# Patient Record
Sex: Female | Born: 1950 | Race: White | Hispanic: No | Marital: Married | Smoking: Former smoker
Health system: Southern US, Community
[De-identification: ages and names within clinical notes are randomized; demographics above are authoritative.]

## PROBLEM LIST (undated history)

## (undated) DIAGNOSIS — M1990 Unspecified osteoarthritis, unspecified site: Secondary | ICD-10-CM

## (undated) DIAGNOSIS — N183 Chronic kidney disease, stage 3 unspecified: Secondary | ICD-10-CM

## (undated) DIAGNOSIS — N952 Postmenopausal atrophic vaginitis: Secondary | ICD-10-CM

## (undated) DIAGNOSIS — I10 Essential (primary) hypertension: Secondary | ICD-10-CM

## (undated) DIAGNOSIS — E785 Hyperlipidemia, unspecified: Secondary | ICD-10-CM

## (undated) HISTORY — DX: Hyperlipidemia, unspecified: E78.5

## (undated) HISTORY — PX: HERNIA REPAIR: SHX51

## (undated) HISTORY — DX: Unspecified osteoarthritis, unspecified site: M19.90

## (undated) HISTORY — PX: HEMORROIDECTOMY: SUR656

## (undated) HISTORY — DX: Postmenopausal atrophic vaginitis: N95.2

## (undated) HISTORY — DX: Chronic kidney disease, stage 3 unspecified: N18.30

## (undated) HISTORY — DX: Chronic kidney disease, stage 3 (moderate): N18.3

## (undated) HISTORY — DX: Essential (primary) hypertension: I10

---

## 2012-10-24 ENCOUNTER — Ambulatory Visit: Payer: Self-pay | Admitting: Family Medicine

## 2012-10-24 LAB — HM MAMMOGRAPHY

## 2012-10-30 LAB — HM PAP SMEAR

## 2013-11-25 ENCOUNTER — Other Ambulatory Visit: Payer: Self-pay | Admitting: Family Medicine

## 2013-11-25 DIAGNOSIS — N649 Disorder of breast, unspecified: Secondary | ICD-10-CM

## 2013-12-04 ENCOUNTER — Ambulatory Visit
Admission: RE | Admit: 2013-12-04 | Discharge: 2013-12-04 | Disposition: A | Discharge status: Home / Self Care | Payer: BC Managed Care – PPO | Source: Ambulatory Visit | Attending: Family Medicine | Admitting: Family Medicine

## 2013-12-04 DIAGNOSIS — N649 Disorder of breast, unspecified: Secondary | ICD-10-CM

## 2013-12-05 ENCOUNTER — Ambulatory Visit: Payer: Self-pay | Admitting: Neurology

## 2015-01-08 ENCOUNTER — Other Ambulatory Visit: Payer: Self-pay

## 2015-01-08 DIAGNOSIS — Z1231 Encounter for screening mammogram for malignant neoplasm of breast: Secondary | ICD-10-CM

## 2015-01-21 ENCOUNTER — Ambulatory Visit
Admission: RE | Admit: 2015-01-21 | Discharge: 2015-01-21 | Disposition: A | Discharge status: Home / Self Care | Payer: BLUE CROSS/BLUE SHIELD | Source: Ambulatory Visit

## 2015-01-21 DIAGNOSIS — Z1231 Encounter for screening mammogram for malignant neoplasm of breast: Secondary | ICD-10-CM

## 2015-02-25 DIAGNOSIS — N183 Chronic kidney disease, stage 3 unspecified: Secondary | ICD-10-CM | POA: Insufficient documentation

## 2015-02-25 DIAGNOSIS — N952 Postmenopausal atrophic vaginitis: Secondary | ICD-10-CM | POA: Insufficient documentation

## 2015-02-25 DIAGNOSIS — I129 Hypertensive chronic kidney disease with stage 1 through stage 4 chronic kidney disease, or unspecified chronic kidney disease: Secondary | ICD-10-CM | POA: Insufficient documentation

## 2015-02-25 DIAGNOSIS — E785 Hyperlipidemia, unspecified: Secondary | ICD-10-CM | POA: Insufficient documentation

## 2015-03-02 ENCOUNTER — Encounter: Payer: Self-pay | Admitting: Family Medicine

## 2015-03-02 ENCOUNTER — Ambulatory Visit (INDEPENDENT_AMBULATORY_CARE_PROVIDER_SITE_OTHER): Payer: BLUE CROSS/BLUE SHIELD | Admitting: Family Medicine

## 2015-03-02 VITALS — BP 112/73 | HR 61 | Temp 97.0°F | Ht 61.5 in | Wt 143.0 lb

## 2015-03-02 DIAGNOSIS — Z720 Tobacco use: Secondary | ICD-10-CM

## 2015-03-02 DIAGNOSIS — Z0000 Encounter for general adult medical examination without abnormal findings: Secondary | ICD-10-CM | POA: Diagnosis not present

## 2015-03-02 DIAGNOSIS — Z1211 Encounter for screening for malignant neoplasm of colon: Secondary | ICD-10-CM | POA: Diagnosis not present

## 2015-03-02 DIAGNOSIS — I10 Essential (primary) hypertension: Secondary | ICD-10-CM

## 2015-03-02 DIAGNOSIS — R252 Cramp and spasm: Secondary | ICD-10-CM

## 2015-03-02 DIAGNOSIS — Z87891 Personal history of nicotine dependence: Secondary | ICD-10-CM | POA: Insufficient documentation

## 2015-03-02 DIAGNOSIS — E785 Hyperlipidemia, unspecified: Secondary | ICD-10-CM | POA: Diagnosis not present

## 2015-03-02 MED ORDER — ATORVASTATIN CALCIUM 20 MG PO TABS: 20.0000 mg | ORAL_TABLET | Freq: Every day | ORAL | Status: DC

## 2015-03-02 MED ORDER — LISINOPRIL 10 MG PO TABS: 10.0000 mg | ORAL_TABLET | Freq: Every day | ORAL | Status: DC

## 2015-03-02 NOTE — Patient Instructions (Addendum)
Three servings of calcium a day; fall precautions We'll order the low dose chest CT We'll contact you about the lab results Return in one year for next physical   Health Maintenance Adopting a healthy lifestyle and getting preventive care can go a long way to promote health and wellness. Talk with your health care provider about what schedule of regular examinations is right for you. This is a good chance for you to check in with your provider about disease prevention and staying healthy. In between checkups, there are plenty of things you can do on your own. Experts have done a lot of research about which lifestyle changes and preventive measures are most likely to keep you healthy. Ask your health care provider for more information. WEIGHT AND DIET  Eat a healthy diet  Be sure to include plenty of vegetables, fruits, low-fat dairy products, and lean protein.  Do not eat a lot of foods high in solid fats, added sugars, or salt.  Get regular exercise. This is one of the most important things you can do for your health.  Most adults should exercise for at least 150 minutes each week. The exercise should increase your heart rate and make you sweat (moderate-intensity exercise).  Most adults should also do strengthening exercises at least twice a week. This is in addition to the moderate-intensity exercise.  Maintain a healthy weight  Body mass index (BMI) is a measurement that can be used to identify possible weight problems. It estimates body fat based on height and weight. Your health care provider can help determine your BMI and help you achieve or maintain a healthy weight.  For females 54 years of age and older:   A BMI below 18.5 is considered underweight.  A BMI of 18.5 to 24.9 is normal.  A BMI of 25 to 29.9 is considered overweight.  A BMI of 30 and above is considered obese.  Watch levels of cholesterol and blood lipids  You should start having your blood tested for  lipids and cholesterol at 63 years of age, then have this test every 5 years.  You may need to have your cholesterol levels checked more often if:  Your lipid or cholesterol levels are high.  You are older than 63 years of age.  You are at high risk for heart disease.  CANCER SCREENING   Lung Cancer  Lung cancer screening is recommended for adults 68-65 years old who are at high risk for lung cancer because of a history of smoking.  A yearly low-dose CT scan of the lungs is recommended for people who:  Currently smoke.  Have quit within the past 15 years.  Have at least a 30-pack-year history of smoking. A pack year is smoking an average of one pack of cigarettes a day for 1 year.  Yearly screening should continue until it has been 15 years since you quit.  Yearly screening should stop if you develop a health problem that would prevent you from having lung cancer treatment.  Breast Cancer  Practice breast self-awareness. This means understanding how your breasts normally appear and feel.  It also means doing regular breast self-exams. Let your health care provider know about any changes, no matter how small.  If you are in your 20s or 30s, you should have a clinical breast exam (CBE) by a health care provider every 1-3 years as part of a regular health exam.  If you are 69 or older, have a CBE every year. Also  consider having a breast X-ray (mammogram) every year.  If you have a family history of breast cancer, talk to your health care provider about genetic screening.  If you are at high risk for breast cancer, talk to your health care provider about having an MRI and a mammogram every year.  Breast cancer gene (BRCA) assessment is recommended for women who have family members with BRCA-related cancers. BRCA-related cancers include:  Breast.  Ovarian.  Tubal.  Peritoneal cancers.  Results of the assessment will determine the need for genetic counseling and BRCA1  and BRCA2 testing. Cervical Cancer Routine pelvic examinations to screen for cervical cancer are no longer recommended for nonpregnant women who are considered low risk for cancer of the pelvic organs (ovaries, uterus, and vagina) and who do not have symptoms. A pelvic examination may be necessary if you have symptoms including those associated with pelvic infections. Ask your health care provider if a screening pelvic exam is right for you.   The Pap test is the screening test for cervical cancer for women who are considered at risk.  If you had a hysterectomy for a problem that was not cancer or a condition that could lead to cancer, then you no longer need Pap tests.  If you are older than 65 years, and you have had normal Pap tests for the past 10 years, you no longer need to have Pap tests.  If you have had past treatment for cervical cancer or a condition that could lead to cancer, you need Pap tests and screening for cancer for at least 20 years after your treatment.  If you no longer get a Pap test, assess your risk factors if they change (such as having a new sexual partner). This can affect whether you should start being screened again.  Some women have medical problems that increase their chance of getting cervical cancer. If this is the case for you, your health care provider may recommend more frequent screening and Pap tests.  The human papillomavirus (HPV) test is another test that may be used for cervical cancer screening. The HPV test looks for the virus that can cause cell changes in the cervix. The cells collected during the Pap test can be tested for HPV.  The HPV test can be used to screen women 65 years of age and older. Getting tested for HPV can extend the interval between normal Pap tests from three to five years.  An HPV test also should be used to screen women of any age who have unclear Pap test results.  After 63 years of age, women should have HPV testing as often  as Pap tests.  Colorectal Cancer  This type of cancer can be detected and often prevented.  Routine colorectal cancer screening usually begins at 63 years of age and continues through 63 years of age.  Your health care provider may recommend screening at an earlier age if you have risk factors for colon cancer.  Your health care provider may also recommend using home test kits to check for hidden blood in the stool.  A small camera at the end of a tube can be used to examine your colon directly (sigmoidoscopy or colonoscopy). This is done to check for the earliest forms of colorectal cancer.  Routine screening usually begins at age 4.  Direct examination of the colon should be repeated every 5-10 years through 63 years of age. However, you may need to be screened more often if early forms of  precancerous polyps or small growths are found. Skin Cancer  Check your skin from head to toe regularly.  Tell your health care provider about any new moles or changes in moles, especially if there is a change in a mole's shape or color.  Also tell your health care provider if you have a mole that is larger than the size of a pencil eraser.  Always use sunscreen. Apply sunscreen liberally and repeatedly throughout the day.  Protect yourself by wearing long sleeves, pants, a wide-brimmed hat, and sunglasses whenever you are outside. HEART DISEASE, DIABETES, AND HIGH BLOOD PRESSURE   Have your blood pressure checked at least every 1-2 years. High blood pressure causes heart disease and increases the risk of stroke.  If you are between 55 years and 79 years old, ask your health care provider if you should take aspirin to prevent strokes.  Have regular diabetes screenings. This involves taking a blood sample to check your fasting blood sugar level.  If you are at a normal weight and have a low risk for diabetes, have this test once every three years after 63 years of age.  If you are overweight  and have a high risk for diabetes, consider being tested at a younger age or more often. PREVENTING INFECTION  Hepatitis B  If you have a higher risk for hepatitis B, you should be screened for this virus. You are considered at high risk for hepatitis B if:  You were born in a country where hepatitis B is common. Ask your health care provider which countries are considered high risk.  Your parents were born in a high-risk country, and you have not been immunized against hepatitis B (hepatitis B vaccine).  You have HIV or AIDS.  You use needles to inject street drugs.  You live with someone who has hepatitis B.  You have had sex with someone who has hepatitis B.  You get hemodialysis treatment.  You take certain medicines for conditions, including cancer, organ transplantation, and autoimmune conditions. Hepatitis C  Blood testing is recommended for:  Everyone born from 1945 through 1965.  Anyone with known risk factors for hepatitis C. Sexually transmitted infections (STIs)  You should be screened for sexually transmitted infections (STIs) including gonorrhea and chlamydia if:  You are sexually active and are younger than 63 years of age.  You are older than 63 years of age and your health care provider tells you that you are at risk for this type of infection.  Your sexual activity has changed since you were last screened and you are at an increased risk for chlamydia or gonorrhea. Ask your health care provider if you are at risk.  If you do not have HIV, but are at risk, it may be recommended that you take a prescription medicine daily to prevent HIV infection. This is called pre-exposure prophylaxis (PrEP). You are considered at risk if:  You are sexually active and do not regularly use condoms or know the HIV status of your partner(s).  You take drugs by injection.  You are sexually active with a partner who has HIV. Talk with your health care provider about whether  you are at high risk of being infected with HIV. If you choose to begin PrEP, you should first be tested for HIV. You should then be tested every 3 months for as long as you are taking PrEP.  PREGNANCY   If you are premenopausal and you may become pregnant, ask your health care   provider about preconception counseling.  If you may become pregnant, take 400 to 800 micrograms (mcg) of folic acid every day.  If you want to prevent pregnancy, talk to your health care provider about birth control (contraception). OSTEOPOROSIS AND MENOPAUSE   Osteoporosis is a disease in which the bones lose minerals and strength with aging. This can result in serious bone fractures. Your risk for osteoporosis can be identified using a bone density scan.  If you are 65 years of age or older, or if you are at risk for osteoporosis and fractures, ask your health care provider if you should be screened.  Ask your health care provider whether you should take a calcium or vitamin D supplement to lower your risk for osteoporosis.  Menopause may have certain physical symptoms and risks.  Hormone replacement therapy may reduce some of these symptoms and risks. Talk to your health care provider about whether hormone replacement therapy is right for you.  HOME CARE INSTRUCTIONS   Schedule regular health, dental, and eye exams.  Stay current with your immunizations.   Do not use any tobacco products including cigarettes, chewing tobacco, or electronic cigarettes.  If you are pregnant, do not drink alcohol.  If you are breastfeeding, limit how much and how often you drink alcohol.  Limit alcohol intake to no more than 1 drink per day for nonpregnant women. One drink equals 12 ounces of beer, 5 ounces of wine, or 1 ounces of hard liquor.  Do not use street drugs.  Do not share needles.  Ask your health care provider for help if you need support or information about quitting drugs.  Tell your health care  provider if you often feel depressed.  Tell your health care provider if you have ever been abused or do not feel safe at home. Document Released: 12/06/2010 Document Revised: 10/07/2013 Document Reviewed: 04/24/2013 Exit[employer] Patient Information 2015 [employer]. This information is not intended to replace advice given to you by your health care provider. Make sure you discuss any questions you have with your health care provider.  

## 2015-03-02 NOTE — Progress Notes (Signed)
Patient ID: Jasmine Church, female   DOB: Dec 17, 1950, 64 y.o.   MRN: [id number]   Subjective:   Jasmine Church is a 64 y.o. female here for a complete physical exam  Interim issues since last visit: None  USPSTF grade A and B recommendations reviewed Alcohol; none Tobacco: none, quit 2006; smoked from [place] to 2006; smoked 1 ppd Depression screen [facility] 2/9 03/02/2015  Decreased Interest 0  Down, Depressed, Hopeless 0  PHQ - 2 Score 0   HIV and hepatitis C, hepatitis B: politely declined No abuse Pap smear: normal May 2014; due in 2017; no hx of abnormals; saw Dr. Marcelline Mates in Dec 2015 Chol and glucose: draw today Colorectal: she does not want colonoscpies, willing to do stool cards Breast cancer: gets mammograms yearly Osteoporosis: not yet 66; no early menopause; no steroid use; hx of smoking; sister borderline puny bones; not good calcium; start at age 4 per her requests AAA: no fam hx Aspirin: taking daily 81 mg Diet: coffee for breakfast; big meal at supper; salad, deer burgers, chicken; not much red meat; likes cheese, not many eggs Exercise: moves around a lot, more active than most people; no fit bit or measuring device Skin cancer: avoid tanning beds; not using sunscreen  Past Medical History  Diagnosis Date  . Arthritis   . Vaginal atrophy   . Hypertension   . Hyperlipidemia   . CKD (chronic kidney disease) stage 3, GFR 30-59 ml/min    Past Surgical History  Procedure Laterality Date  . Hemorroidectomy    . Hernia repair     Family History  Problem Relation Age of Onset  . Arthritis Mother   . Hyperlipidemia Mother   . Hypertension Mother   . Heart disease Father   . Hypertension Father   . Heart attack Father   . Cancer Sister     uterus  . Hypertension Sister   . Heart disease Brother   . Heart attack Brother   . Hypertension Brother   . Stroke Maternal Grandmother   . Diabetes Neg Hx   . Heart attack Brother   . Hypertension Brother    Social  History  Substance Use Topics  . Smoking status: Former Smoker -- 1.00 packs/day for 35 years    Types: Cigarettes    Quit date: 06/06/2004  . Smokeless tobacco: Never Used  . Alcohol Use: No   Review of Systems  Constitutional: Negative for unexpected weight change.  HENT: Negative for nosebleeds and sore throat.   Eyes: Negative for visual disturbance.  Respiratory: Negative for shortness of breath and wheezing.   Cardiovascular: Negative for chest pain.  Gastrointestinal: Negative for blood in stool.  Endocrine: Negative for cold intolerance, heat intolerance, polydipsia and polyuria.  Genitourinary: Negative for hematuria.  Musculoskeletal: Positive for myalgias (cramps in legs and feet) and arthralgias (2nd MCP right hand).  Skin:       No worrisome moles  Allergic/Immunologic: Negative for food allergies.  Neurological: Negative for tremors and headaches.  Hematological: Bruises/bleeds easily (on the right breast from broom; taking aspirin).  Psychiatric/Behavioral: Negative for confusion, dysphoric mood and decreased concentration.   Objective:   Filed Vitals:   03/02/15 1354  BP: 112/73  Pulse: 61  Temp: 97 F (36.1 C)  Height: 5' 1.5" (1.562 m)  Weight: 143 lb (64.864 kg)  SpO2: 99%   Body mass index is 26.59 kg/(m^2). Wt Readings from Last 3 Encounters:  03/02/15 143 lb (64.864 kg)  11/12/13 145  lb (65.772 kg)   Physical Exam  Constitutional: She appears well-developed and well-nourished.  HENT:  Head: Normocephalic and atraumatic.  Right Ear: Hearing, tympanic membrane, external ear and ear canal normal.  Left Ear: Hearing, tympanic membrane, external ear and ear canal normal.  Eyes: Conjunctivae and EOM are normal. Right eye exhibits no hordeolum. Left eye exhibits no hordeolum. No scleral icterus.  Neck: Carotid bruit is not present. No thyromegaly present.  Cardiovascular: Normal rate, regular rhythm, S1 normal, S2 normal and normal heart sounds.   No  extrasystoles are present.  Pulmonary/Chest: Effort normal and breath sounds normal. No respiratory distress. Right breast exhibits no inverted nipple, no mass, no nipple discharge, no skin change and no tenderness. Left breast exhibits no inverted nipple, no mass, no nipple discharge, no skin change and no tenderness. Breasts are symmetrical.  Abdominal: Soft. Normal appearance and bowel sounds are normal. She exhibits no distension, no abdominal bruit, no pulsatile midline mass and no mass. There is no hepatosplenomegaly. There is no tenderness. No hernia.  Musculoskeletal: Normal range of motion. She exhibits no edema.  Lymphadenopathy:       Head (right side): No submandibular adenopathy present.       Head (left side): No submandibular adenopathy present.    She has no cervical adenopathy.    She has no axillary adenopathy.  Neurological: She is alert. She displays no tremor. No cranial nerve deficit. She exhibits normal muscle tone. Gait normal.  Reflex Scores:      Patellar reflexes are 2+ on the right side and 2+ on the left side. Skin: Skin is warm and dry. No bruising and no ecchymosis noted. No cyanosis. No pallor.  Psychiatric: Her speech is normal and behavior is normal. Thought content normal. Her mood appears not anxious. She does not exhibit a depressed mood.    Assessment/Plan:   Problem List Items Addressed This Visit      Cardiovascular and Mediastinum   Hypertension    Well-controlled on current medicine; check K+ and creatinine; refills of med given      Relevant Medications   aspirin EC 81 MG tablet   atorvastatin (LIPITOR) 20 MG tablet   lisinopril (PRINIVIL,ZESTRIL) 10 MG tablet     Other   Hyperlipidemia    Check lipids today; monitor sgpt on statin; refills of medicine given today      Relevant Medications   aspirin EC 81 MG tablet   atorvastatin (LIPITOR) 20 MG tablet   lisinopril (PRINIVIL,ZESTRIL) 10 MG tablet   Hx of smoking    Recommended yearly  low-dose chest CT to screen for lung cancer until she is 15 years out from smoking cessation      Relevant Orders   CT CHEST LUNG CA SCREEN LOW DOSE W/O CM   Preventative health care - Primary    USPSTF grade A and B recommendations reviewed; healthy living encouraged; mammogram every 1-2 years      Relevant Orders   Lipid Panel w/o Chol/HDL Ratio (Completed)   Comprehensive metabolic panel (Completed)   Leg cramps    Check Mg2+ today      Relevant Orders   Magnesium (Completed)   Colon cancer screening    Patient refuses colonoscopy; willing to do stool cards         Meds ordered this encounter  Medications  . aspirin EC 81 MG tablet    Sig: Take 81 mg by mouth daily.  Marland Kitchen atorvastatin (LIPITOR) 20 MG tablet  Sig: Take 1 tablet (20 mg total) by mouth at bedtime.    Dispense:  90 tablet    Refill:  0  . lisinopril (PRINIVIL,ZESTRIL) 10 MG tablet    Sig: Take 1 tablet (10 mg total) by mouth daily.    Dispense:  90 tablet    Refill:  0   Orders Placed This Encounter  Procedures  . CT CHEST LUNG CA SCREEN LOW DOSE W/O CM    Standing Status: Future     Number of Occurrences:      Standing Expiration Date: 05/01/2016    Order Specific Question:  Reason for Exam (SYMPTOM  OR DIAGNOSIS REQUIRED)    Answer:  hx of smoking, quit in 2006    Order Specific Question:  Preferred Imaging Location?    Answer:  [facility]  . Magnesium  . Lipid Panel w/o Chol/HDL Ratio    Order Specific Question:  Has the patient fasted?    Answer:  Yes  . Comprehensive metabolic panel    Order Specific Question:  Has the patient fasted?    Answer:  Yes    Follow up plan: Return in about 1 year (around 03/01/2016) for next physical. An after-visit summary was printed and given to the patient at [place].  Please see the patient instructions which may contain other information and recommendations beyond what is mentioned above in the assessment and plan.  Meds ordered  this encounter  Medications  . aspirin EC 81 MG tablet    Sig: Take 81 mg by mouth daily.  Marland Kitchen atorvastatin (LIPITOR) 20 MG tablet    Sig: Take 1 tablet (20 mg total) by mouth at bedtime.    Dispense:  90 tablet    Refill:  0  . lisinopril (PRINIVIL,ZESTRIL) 10 MG tablet    Sig: Take 1 tablet (10 mg total) by mouth daily.    Dispense:  90 tablet    Refill:  0   Orders Placed This Encounter  Procedures  . CT CHEST LUNG CA SCREEN LOW DOSE W/O CM  . Magnesium  . Lipid Panel w/o Chol/HDL Ratio  . Comprehensive metabolic panel

## 2015-03-03 ENCOUNTER — Encounter: Payer: Self-pay | Admitting: Family Medicine

## 2015-03-03 DIAGNOSIS — Z1211 Encounter for screening for malignant neoplasm of colon: Secondary | ICD-10-CM | POA: Insufficient documentation

## 2015-03-03 LAB — COMPREHENSIVE METABOLIC PANEL
ALBUMIN: 4.2 g/dL (ref 3.6–4.8)
ALT: 23 IU/L (ref 0–32)
AST: 20 IU/L (ref 0–40)
Albumin/Globulin Ratio: 1.8 (ref 1.1–2.5)
Alkaline Phosphatase: 80 IU/L (ref 39–117)
BILIRUBIN TOTAL: 0.4 mg/dL (ref 0.0–1.2)
BUN / CREAT RATIO: 13 (ref 11–26)
BUN: 13 mg/dL (ref 8–27)
CO2: 24 mmol/L (ref 18–29)
CREATININE: 1 mg/dL (ref 0.57–1.00)
Calcium: 9.5 mg/dL (ref 8.7–10.3)
Chloride: 102 mmol/L (ref 97–108)
GFR calc non Af Amer: 61 mL/min/{1.73_m2} (ref >59)
GFR, EST AFRICAN AMERICAN: 70 mL/min/{1.73_m2} (ref >59)
GLUCOSE: 84 mg/dL (ref 65–99)
Globulin, Total: 2.4 g/dL (ref 1.5–4.5)
Potassium: 4.7 mmol/L (ref 3.5–5.2)
Sodium: 141 mmol/L (ref 134–144)
TOTAL PROTEIN: 6.6 g/dL (ref 6.0–8.5)

## 2015-03-03 LAB — LIPID PANEL W/O CHOL/HDL RATIO
Cholesterol, Total: 150 mg/dL (ref 100–199)
HDL: 60 mg/dL (ref >39)
LDL Calculated: 75 mg/dL (ref 0–99)
TRIGLYCERIDES: 73 mg/dL (ref 0–149)
VLDL Cholesterol Cal: 15 mg/dL (ref 5–40)

## 2015-03-03 LAB — MAGNESIUM: MAGNESIUM: 2 mg/dL (ref 1.6–2.3)

## 2015-03-03 NOTE — Assessment & Plan Note (Signed)
Recommended yearly low-dose chest CT to screen for lung cancer until she is 15 years out from smoking cessation

## 2015-03-03 NOTE — Assessment & Plan Note (Signed)
USPSTF grade A and B recommendations reviewed; healthy living encouraged; mammogram every 1-2 years

## 2015-03-03 NOTE — Assessment & Plan Note (Signed)
Patient refuses colonoscopy; willing to do stool cards

## 2015-03-03 NOTE — Assessment & Plan Note (Signed)
Check Mg2+ today

## 2015-03-03 NOTE — Assessment & Plan Note (Signed)
Check lipids today; monitor sgpt on statin; refills of medicine given today

## 2015-03-03 NOTE — Assessment & Plan Note (Signed)
Well-controlled on current medicine; check K+ and creatinine; refills of med given

## 2015-03-10 ENCOUNTER — Other Ambulatory Visit: Payer: Self-pay

## 2015-03-10 DIAGNOSIS — Z1211 Encounter for screening for malignant neoplasm of colon: Secondary | ICD-10-CM

## 2015-03-10 LAB — FECAL OCCULT BLOOD, GUAIAC
SPECIMEN 1: NEGATIVE
SPECIMEN 3: NEGATIVE
Specimen 2: NEGATIVE

## 2015-03-18 ENCOUNTER — Telehealth: Payer: Self-pay

## 2015-03-18 ENCOUNTER — Encounter: Payer: Self-pay | Admitting: Family Medicine

## 2015-03-18 NOTE — Telephone Encounter (Signed)
She is attempting to get life insurance. She had a referral to do a low dose chest CT but she won't be doing it because her insurance will not pay for it. She needs a letter to get approved for life insurance stating that the low dose Chest CT is not medically necessary and that it was just a screening test. Letter needs to be faxed to [phone]

## 2015-03-18 NOTE — Telephone Encounter (Signed)
I spoke with Jasmine Church about the test. She said her insurance does cover the test but that her copay for it would be $100 and she can't afford to pay that. I advised her if she changes her mind, to call us back and we will get it scheduled.

## 2015-03-18 NOTE — Telephone Encounter (Signed)
I typed up a letter However, she needs to clarify the difference between life insurance and health insurance Her health insurance absolutely should pay for this; if they don't, I want to know so I can report it I absolutely recommend that she have the chest CT done

## 2015-03-18 NOTE — Telephone Encounter (Signed)
Letter faxed to Chase Picket at [employer]. OK per patient.

## 2015-05-25 ENCOUNTER — Other Ambulatory Visit: Payer: Self-pay | Admitting: Family Medicine

## 2015-05-25 NOTE — Telephone Encounter (Signed)
Sept 2016 labs reviewed; Rx approved I do not see that her chest CT for lung cancer screening has been scheduled, so I sent a message to Burgess Estelle to please schedule that

## 2015-05-25 NOTE — Telephone Encounter (Signed)
Routing to provider  

## 2015-05-28 ENCOUNTER — Telehealth: Payer: Self-pay | Admitting: *Deleted

## 2015-05-28 NOTE — Telephone Encounter (Signed)
-----   Message from Lieutenant Diego, RN sent at 05/28/2015 11:47 AM EST ----- Regarding: RE: Low dose chest CT please Dr. Sanda Klein, in the past Ms. Manzer told me that she did not want a screening scan. I will be happy to contact her again. Thanks, Shawn ----- Message -----    From: Arnetha Courser, MD    Sent: 05/25/2015   8:55 PM      To: Lieutenant Diego, RN Subject: Low dose chest CT please                       Jasmine Church Female, 64 y.o., Apr 06, 1951 MRN:  [id number] Low dose CT scan of chest for lung cancer screening please

## 2015-05-28 NOTE — Telephone Encounter (Signed)
Spoke with patient at time of referral for lung cancer screening CT scan (03/03/15-03/17/15). After lengthy conversations with the patient and insurance company, I was able to confirm that screening scan is covered by insurance with a copay that will be due. I informed the patient that we have funds available to offer the scan regardless of ability to pay the copay amount. The patient adamantly did not want the scan done, even if she would not be responsible for the copay amount.

## 2015-07-26 ENCOUNTER — Other Ambulatory Visit: Payer: Self-pay | Admitting: Family Medicine

## 2015-07-26 NOTE — Telephone Encounter (Signed)
Last BP and CMP from Sept 2016 reviewed; Rx approved

## 2015-07-28 ENCOUNTER — Other Ambulatory Visit: Payer: Self-pay | Admitting: Family Medicine

## 2015-07-28 NOTE — Telephone Encounter (Signed)
Last cmp reviewed Sept 2016; rx approved

## 2015-08-27 ENCOUNTER — Other Ambulatory Visit: Payer: Self-pay | Admitting: Family Medicine

## 2015-08-27 NOTE — Telephone Encounter (Signed)
Labs from sept 2016 reviewed; Rx approved

## 2016-01-08 ENCOUNTER — Other Ambulatory Visit: Payer: Self-pay | Admitting: Family Medicine

## 2016-01-08 DIAGNOSIS — Z1231 Encounter for screening mammogram for malignant neoplasm of breast: Secondary | ICD-10-CM

## 2016-01-26 ENCOUNTER — Ambulatory Visit
Admission: RE | Admit: 2016-01-26 | Discharge: 2016-01-26 | Disposition: A | Discharge status: Home / Self Care | Payer: BLUE CROSS/BLUE SHIELD | Source: Ambulatory Visit | Attending: Family Medicine | Admitting: Family Medicine

## 2016-01-26 DIAGNOSIS — Z1231 Encounter for screening mammogram for malignant neoplasm of breast: Secondary | ICD-10-CM

## 2016-01-27 ENCOUNTER — Encounter (INDEPENDENT_AMBULATORY_CARE_PROVIDER_SITE_OTHER): Payer: Self-pay

## 2016-01-27 ENCOUNTER — Other Ambulatory Visit: Payer: Self-pay | Admitting: Family Medicine

## 2016-01-27 DIAGNOSIS — R928 Other abnormal and inconclusive findings on diagnostic imaging of breast: Secondary | ICD-10-CM

## 2016-02-02 ENCOUNTER — Ambulatory Visit
Admission: RE | Admit: 2016-02-02 | Discharge: 2016-02-02 | Disposition: A | Discharge status: Home / Self Care | Payer: BLUE CROSS/BLUE SHIELD | Source: Ambulatory Visit | Attending: Family Medicine | Admitting: Family Medicine

## 2016-02-02 ENCOUNTER — Encounter: Payer: Self-pay | Admitting: Family Medicine

## 2016-02-02 DIAGNOSIS — R928 Other abnormal and inconclusive findings on diagnostic imaging of breast: Secondary | ICD-10-CM

## 2016-02-25 ENCOUNTER — Telehealth: Payer: Self-pay

## 2016-02-25 MED ORDER — ATORVASTATIN CALCIUM 20 MG PO TABS: ORAL_TABLET | ORAL | 1 refills | Status: DC

## 2016-02-25 NOTE — Telephone Encounter (Signed)
Atorvastatin 20mg  Patient scheduled for 03/02/16

## 2016-03-02 ENCOUNTER — Encounter: Payer: Self-pay | Admitting: Family Medicine

## 2016-03-02 ENCOUNTER — Ambulatory Visit (INDEPENDENT_AMBULATORY_CARE_PROVIDER_SITE_OTHER): Payer: BLUE CROSS/BLUE SHIELD | Admitting: Family Medicine

## 2016-03-02 ENCOUNTER — Encounter: Payer: BLUE CROSS/BLUE SHIELD | Admitting: Family Medicine

## 2016-03-02 VITALS — BP 131/82 | HR 65 | Temp 97.9°F | Ht 61.7 in | Wt 143.0 lb

## 2016-03-02 DIAGNOSIS — N952 Postmenopausal atrophic vaginitis: Secondary | ICD-10-CM | POA: Diagnosis not present

## 2016-03-02 DIAGNOSIS — I10 Essential (primary) hypertension: Secondary | ICD-10-CM

## 2016-03-02 DIAGNOSIS — Z1211 Encounter for screening for malignant neoplasm of colon: Secondary | ICD-10-CM

## 2016-03-02 DIAGNOSIS — N183 Chronic kidney disease, stage 3 unspecified: Secondary | ICD-10-CM

## 2016-03-02 DIAGNOSIS — Z0000 Encounter for general adult medical examination without abnormal findings: Secondary | ICD-10-CM | POA: Diagnosis not present

## 2016-03-02 DIAGNOSIS — Z23 Encounter for immunization: Secondary | ICD-10-CM

## 2016-03-02 DIAGNOSIS — E785 Hyperlipidemia, unspecified: Secondary | ICD-10-CM | POA: Diagnosis not present

## 2016-03-02 DIAGNOSIS — Z124 Encounter for screening for malignant neoplasm of cervix: Secondary | ICD-10-CM | POA: Diagnosis not present

## 2016-03-02 LAB — MICROALBUMIN, URINE WAIVED
Creatinine, Urine Waived: 200 mg/dL (ref 10–300)
MICROALB, UR WAIVED: 10 mg/L (ref 0–19)
Microalb/Creat Ratio: <30 mg/g (ref <30)

## 2016-03-02 LAB — UA/M W/RFLX CULTURE, ROUTINE
BILIRUBIN UA: NEGATIVE
Glucose, UA: NEGATIVE
KETONES UA: NEGATIVE
LEUKOCYTES UA: NEGATIVE
NITRITE UA: NEGATIVE
PH UA: 5 (ref 5.0–7.5)
Protein, UA: NEGATIVE
Specific Gravity, UA: 1.015 (ref 1.005–1.030)
UUROB: 0.2 mg/dL (ref 0.2–1.0)

## 2016-03-02 LAB — MICROSCOPIC EXAMINATION: WBC, UA: NONE SEEN /hpf (ref 0–?)

## 2016-03-02 NOTE — Progress Notes (Signed)
BP 131/82 (BP Location: Left Arm, Patient Position: Sitting, Cuff Size: Normal)   Pulse 65   Temp 97.9 F (36.6 C)   Ht 5' 1.7" (1.567 m)   Wt 143 lb (64.9 kg)   SpO2 100%   BMI 26.41 kg/m    Subjective:    Patient ID: Jasmine Church, female    DOB: Jan 01, 1951, 64 y.o.   MRN: [id number]  HPI: Jasmine Church is a 65 y.o. female presenting on 03/02/2016 for comprehensive medical examination. Current medical complaints include:  HYPERTENSION / HYPERLIPIDEMIA Satisfied with current treatment? yes Duration of hypertension: chronic BP monitoring frequency: not checking BP medication side effects: no Duration of hyperlipidemia: chronic Cholesterol medication side effects: no Cholesterol supplements: none Past cholesterol medications: atorvastain (lipitor) Medication compliance: excellent compliance Aspirin: yes Recent stressors: no Recurrent headaches: no Visual changes: no Palpitations: no Dyspnea: no Chest pain: no Lower extremity edema: no Dizzy/lightheaded: no  She currently lives with: husband Menopausal Symptoms: yes  Depression Screen done today and results listed below:  Depression screen [facility] 2/9 03/02/2016 03/02/2016 03/02/2015  Decreased Interest 0 0 0  Down, Depressed, Hopeless 0 0 0  PHQ - 2 Score 0 0 0    Past Medical History:  Past Medical History:  Diagnosis Date  . Arthritis   . CKD (chronic kidney disease) stage 3, GFR 30-59 ml/min   . Hyperlipidemia   . Hypertension   . Vaginal atrophy     Surgical History:  Past Surgical History:  Procedure Laterality Date  . HEMORROIDECTOMY    . HERNIA REPAIR      Medications:  Current Outpatient Prescriptions on File Prior to Visit  Medication Sig  . aspirin EC 81 MG tablet Take 81 mg by mouth daily.  Marland Kitchen atorvastatin (LIPITOR) 20 MG tablet TAKE 1 TABLET (20 MG TOTAL) BY MOUTH AT BEDTIME.  . Calcium Carbonate-Vitamin D (CALCIUM 500 + D) 500-125 MG-UNIT TABS Take by mouth daily. Patient takes it only 3  days a week.  Marland Kitchen lisinopril (PRINIVIL,ZESTRIL) 10 MG tablet TAKE 1 TABLET (10 MG TOTAL) BY MOUTH DAILY.   No current facility-administered medications on file prior to visit.     Allergies:  No Known Allergies  Social History:  Social History   Social History  . Marital status: Married    Spouse name: N/A  . Number of children: N/A  . Years of education: N/A   Occupational History  . Not on file.   Social History Main Topics  . Smoking status: Former Smoker    Packs/day: 1.00    Years: 35.00    Types: Cigarettes    Quit date: 06/06/2004  . Smokeless tobacco: Never Used  . Alcohol use No  . Drug use: No  . Sexual activity: No   Other Topics Concern  . Not on file   Social History Narrative  . No narrative on file   History  Smoking Status  . Former Smoker  . Packs/day: 1.00  . Years: 35.00  . Types: Cigarettes  . Quit date: 06/06/2004  Smokeless Tobacco  . Never Used   History  Alcohol Use No    Family History:  Family History  Problem Relation Age of Onset  . Arthritis Mother   . Hyperlipidemia Mother   . Hypertension Mother   . Heart disease Father   . Hypertension Father   . Heart attack Father   . Cancer Sister     uterus  . Hypertension Sister   .  Heart disease Brother   . Heart attack Brother   . Hypertension Brother   . Stroke Maternal Grandmother   . Heart attack Brother   . Hypertension Brother   . Diabetes Neg Hx     Past medical history, surgical history, medications, allergies, family history and social history reviewed with patient today and changes made to appropriate areas of the chart.   Review of Systems  Constitutional: Negative.   HENT: Negative.   Eyes: Negative.   Respiratory: Negative.   Cardiovascular: Negative.   Gastrointestinal: Positive for constipation and heartburn. Negative for abdominal pain, blood in stool, diarrhea, melena, nausea and vomiting.  Genitourinary: Negative.   Musculoskeletal: Negative.   Skin:  Negative.   Neurological: Positive for tingling (positional at night). Negative for dizziness, tremors, sensory change, speech change, focal weakness, seizures and loss of consciousness.  Endo/Heme/Allergies: Negative for environmental allergies and polydipsia. Bruises/bleeds easily.  Psychiatric/Behavioral: Negative.     All other ROS negative except what is listed above and in the HPI.      Objective:    BP 131/82 (BP Location: Left Arm, Patient Position: Sitting, Cuff Size: Normal)   Pulse 65   Temp 97.9 F (36.6 C)   Ht 5' 1.7" (1.567 m)   Wt 143 lb (64.9 kg)   SpO2 100%   BMI 26.41 kg/m   Wt Readings from Last 3 Encounters:  03/02/16 143 lb (64.9 kg)  03/02/15 143 lb (64.9 kg)  11/12/13 145 lb (65.8 kg)    Physical Exam  Constitutional: She is oriented to person, place, and time. She appears well-developed and well-nourished. No distress.  HENT:  Head: Normocephalic and atraumatic.  Right Ear: Hearing, tympanic membrane, external ear and ear canal normal.  Left Ear: Hearing, tympanic membrane, external ear and ear canal normal.  Nose: Nose normal.  Mouth/Throat: Uvula is midline, oropharynx is clear and moist and mucous membranes are normal. No oropharyngeal exudate.  Eyes: Conjunctivae, EOM and lids are normal. Pupils are equal, round, and reactive to light. Right eye exhibits no discharge. Left eye exhibits no discharge. No scleral icterus.  Neck: Normal range of motion. Neck supple. No JVD present. No tracheal deviation present. No thyromegaly present.  Cardiovascular: Normal rate, regular rhythm, normal heart sounds and intact distal pulses.  Exam reveals no gallop and no friction rub.   No murmur heard. Pulmonary/Chest: Effort normal. No stridor. No respiratory distress. She has no wheezes. She has no rales. She exhibits no tenderness. Right breast exhibits no inverted nipple, no mass, no nipple discharge, no skin change and no tenderness. Left breast exhibits no  inverted nipple, no mass, no nipple discharge, no skin change and no tenderness. Breasts are symmetrical.  Abdominal: Soft. Bowel sounds are normal. She exhibits no distension and no mass. There is no tenderness. There is no rebound and no guarding. Hernia confirmed negative in the right inguinal area and confirmed negative in the left inguinal area.  Genitourinary: Uterus normal. No breast swelling, tenderness, discharge or bleeding. No labial fusion. There is no rash, tenderness, lesion or injury on the right labia. There is no rash, tenderness, lesion or injury on the left labia. Uterus is not deviated, not enlarged, not fixed and not tender. Cervix exhibits no motion tenderness, no discharge and no friability. Right adnexum displays no mass, no tenderness and no fullness. Left adnexum displays no mass, no tenderness and no fullness. No erythema, tenderness or bleeding in the vagina. No foreign body in the vagina. No signs  of injury around the vagina. No vaginal discharge found.  Musculoskeletal: Normal range of motion.  Lymphadenopathy:    She has no cervical adenopathy.  Neurological: She is alert and oriented to person, place, and time.  Skin: Skin is intact. No rash noted. She is not diaphoretic.  Psychiatric: She has a normal mood and affect. Her speech is normal and behavior is normal. Judgment and thought content normal. Cognition and memory are normal.  Nursing note and vitals reviewed.   Results for orders placed or performed in visit on 03/10/15  [facility] Stool Card-TAKE HOME  Result Value Ref Range   Specimen 1 Neg    Specimen 2 Neg    Specimen 3 Neg       Assessment & Plan:   Problem List Items Addressed This Visit      Cardiovascular and Mediastinum   Hypertension    Under good control. Continue current regimen. Continue to monitor. Follow up 6 months.       Relevant Orders   Comprehensive metabolic panel   Microalbumin, Urine Waived   TSH   UA/M w/rflx Culture, Routine       Genitourinary   Vaginal atrophy    Improved with medication. Will call if starting to act up again and will do short course of estrogen.      CKD (chronic kidney disease) stage 3, GFR 30-59 ml/min    Continue to follow with nephrology. Call with any concerns.       Relevant Orders   CBC with Differential/Platelet   Comprehensive metabolic panel   UA/M w/rflx Culture, Routine     Other   Hyperlipidemia    Checking labs today. Adjust as needed. Refills given today. Call with any concerns.       Relevant Orders   Comprehensive metabolic panel   Lipid Panel w/o Chol/HDL Ratio    Other Visit Diagnoses    Routine general medical examination at a health care facility    -  Primary   Up to date on vaccines or declined. Screening labs checked today. Pap done today. Up to date on mammogram. Cologuard ordered today. Work on diet and exercise.    Relevant Orders   CBC with Differential/Platelet   Comprehensive metabolic panel   Lipid Panel w/o Chol/HDL Ratio   Microalbumin, Urine Waived   TSH   UA/M w/rflx Culture, Routine   Immunization due       Tdap given today.   Screening for colon cancer       Will do cologuard. Ordered today.   Relevant Orders   Cologuard   Encounter for screening for cervical cancer        Pap done today. Await results.   Relevant Orders   IGP, Aptima HPV, rfx 16/18,45       Follow up plan: Return in about 6 months (around 08/30/2016) for Follow up HTN and HLD.   LABORATORY TESTING:  - Pap smear: pap done  IMMUNIZATIONS:   - Tdap: Tetanus vaccination status reviewed: Tdap vaccination indicated and given today. - Influenza: Refused - Pneumovax: Declined  SCREENING: -Mammogram: Up to date  - Colonoscopy: Ordered today   PATIENT COUNSELING:   Advised to take 1 mg of folate supplement per day if capable of pregnancy.   Sexuality: Discussed sexually transmitted diseases, partner selection, use of condoms, avoidance of unintended pregnancy   and contraceptive alternatives.   Advised to avoid cigarette smoking.  I discussed with the patient that most people either abstain from  alcohol or drink within safe limits (<=14/week and <=4 drinks/occasion for males, <=7/weeks and <= 3 drinks/occasion for females) and that the risk for alcohol disorders and other health effects rises proportionally with the number of drinks per week and how often a drinker exceeds daily limits.  Discussed cessation/primary prevention of drug use and availability of treatment for abuse.   Diet: Encouraged to adjust caloric intake to maintain  or achieve ideal body weight, to reduce intake of dietary saturated fat and total fat, to limit sodium intake by avoiding high sodium foods and not adding table salt, and to maintain adequate dietary potassium and calcium preferably from fresh fruits, vegetables, and low-fat dairy products.    stressed the importance of regular exercise  Injury prevention: Discussed safety belts, safety helmets, smoke detector, smoking near bedding or upholstery.   Dental health: Discussed importance of regular tooth brushing, flossing, and dental visits.    NEXT PREVENTATIVE PHYSICAL DUE IN 1 YEAR. Return in about 6 months (around 08/30/2016) for Follow up HTN and HLD.

## 2016-03-02 NOTE — Assessment & Plan Note (Signed)
Improved with medication. Will call if starting to act up again and will do short course of estrogen.

## 2016-03-02 NOTE — Assessment & Plan Note (Signed)
Continue to follow with nephrology. Call with any concerns.  

## 2016-03-02 NOTE — Assessment & Plan Note (Signed)
Under good control. Continue current regimen. Continue to monitor. Follow up 6 months.

## 2016-03-02 NOTE — Assessment & Plan Note (Signed)
Checking labs today. Adjust as needed. Refills given today. Call with any concerns.

## 2016-03-02 NOTE — Patient Instructions (Addendum)
Health Maintenance, Female Adopting a healthy lifestyle and getting preventive care can go a long way to promote health and wellness. Talk with your health care provider about what schedule of regular examinations is right for you. This is a good chance for you to check in with your provider about disease prevention and staying healthy. In between checkups, there are plenty of things you can do on your own. Experts have done a lot of research about which lifestyle changes and preventive measures are most likely to keep you healthy. Ask your health care provider for more information. WEIGHT AND DIET  Eat a healthy diet  Be sure to include plenty of vegetables, fruits, low-fat dairy products, and lean protein.  Do not eat a lot of foods high in solid fats, added sugars, or salt.  Get regular exercise. This is one of the most important things you can do for your health.  Most adults should exercise for at least 150 minutes each week. The exercise should increase your heart rate and make you sweat (moderate-intensity exercise).  Most adults should also do strengthening exercises at least twice a week. This is in addition to the moderate-intensity exercise.  Maintain a healthy weight  Body mass index (BMI) is a measurement that can be used to identify possible weight problems. It estimates body fat based on height and weight. Your health care provider can help determine your BMI and help you achieve or maintain a healthy weight.  For females 20 years of age and older:   A BMI below 18.5 is considered underweight.  A BMI of 18.5 to 24.9 is normal.  A BMI of 25 to 29.9 is considered overweight.  A BMI of 30 and above is considered obese.  Watch levels of cholesterol and blood lipids  You should start having your blood tested for lipids and cholesterol at 64 years of age, then have this test every 5 years.  You may need to have your cholesterol levels checked more often if:  Your lipid  or cholesterol levels are high.  You are older than 64 years of age.  You are at high risk for heart disease.  CANCER SCREENING   Lung Cancer  Lung cancer screening is recommended for adults 55-80 years old who are at high risk for lung cancer because of a history of smoking.  A yearly low-dose CT scan of the lungs is recommended for people who:  Currently smoke.  Have quit within the past 15 years.  Have at least a 30-pack-year history of smoking. A pack year is smoking an average of one pack of cigarettes a day for 1 year.  Yearly screening should continue until it has been 15 years since you quit.  Yearly screening should stop if you develop a health problem that would prevent you from having lung cancer treatment.  Breast Cancer  Practice breast self-awareness. This means understanding how your breasts normally appear and feel.  It also means doing regular breast self-exams. Let your health care provider know about any changes, no matter how small.  If you are in your 20s or 30s, you should have a clinical breast exam (CBE) by a health care provider every 1-3 years as part of a regular health exam.  If you are 40 or older, have a CBE every year. Also consider having a breast X-ray (mammogram) every year.  If you have a family history of breast cancer, talk to your health care provider about genetic screening.  If you   are at high risk for breast cancer, talk to your health care provider about having an MRI and a mammogram every year.  Breast cancer gene (BRCA) assessment is recommended for women who have family members with BRCA-related cancers. BRCA-related cancers include:  Breast.  Ovarian.  Tubal.  Peritoneal cancers.  Results of the assessment will determine the need for genetic counseling and BRCA1 and BRCA2 testing. Cervical Cancer Your health care provider may recommend that you be screened regularly for cancer of the pelvic organs (ovaries, uterus, and  vagina). This screening involves a pelvic examination, including checking for microscopic changes to the surface of your cervix (Pap test). You may be encouraged to have this screening done every 3 years, beginning at age 21.  For women ages 30-65, health care providers may recommend pelvic exams and Pap testing every 3 years, or they may recommend the Pap and pelvic exam, combined with testing for human papilloma virus (HPV), every 5 years. Some types of HPV increase your risk of cervical cancer. Testing for HPV may also be done on women of any age with unclear Pap test results.  Other health care providers may not recommend any screening for nonpregnant women who are considered low risk for pelvic cancer and who do not have symptoms. Ask your health care provider if a screening pelvic exam is right for you.  If you have had past treatment for cervical cancer or a condition that could lead to cancer, you need Pap tests and screening for cancer for at least 20 years after your treatment. If Pap tests have been discontinued, your risk factors (such as having a new sexual partner) need to be reassessed to determine if screening should resume. Some women have medical problems that increase the chance of getting cervical cancer. In these cases, your health care provider may recommend more frequent screening and Pap tests. Colorectal Cancer  This type of cancer can be detected and often prevented.  Routine colorectal cancer screening usually begins at 64 years of age and continues through 64 years of age.  Your health care provider may recommend screening at an earlier age if you have risk factors for colon cancer.  Your health care provider may also recommend using home test kits to check for hidden blood in the stool.  A small camera at the end of a tube can be used to examine your colon directly (sigmoidoscopy or colonoscopy). This is done to check for the earliest forms of colorectal  cancer.  Routine screening usually begins at age 50.  Direct examination of the colon should be repeated every 5-10 years through 64 years of age. However, you may need to be screened more often if early forms of precancerous polyps or small growths are found. Skin Cancer  Check your skin from head to toe regularly.  Tell your health care provider about any new moles or changes in moles, especially if there is a change in a mole's shape or color.  Also tell your health care provider if you have a mole that is larger than the size of a pencil eraser.  Always use sunscreen. Apply sunscreen liberally and repeatedly throughout the day.  Protect yourself by wearing long sleeves, pants, a wide-brimmed hat, and sunglasses whenever you are outside. HEART DISEASE, DIABETES, AND HIGH BLOOD PRESSURE   High blood pressure causes heart disease and increases the risk of stroke. High blood pressure is more likely to develop in:  People who have blood pressure in the high end   of the normal range (130-139/85-89 mm Hg).  People who are overweight or obese.  People who are African American.  If you are 38-23 years of age, have your blood pressure checked every 3-5 years. If you are 61 years of age or older, have your blood pressure checked every year. You should have your blood pressure measured twice--once when you are at a hospital or clinic, and once when you are not at a hospital or clinic. Record the average of the two measurements. To check your blood pressure when you are not at a hospital or clinic, you can use:  An automated blood pressure machine at a pharmacy.  A home blood pressure monitor.  If you are between 45 years and 39 years old, ask your health care provider if you should take aspirin to prevent strokes.  Have regular diabetes screenings. This involves taking a blood sample to check your fasting blood sugar level.  If you are at a normal weight and have a low risk for diabetes,  have this test once every three years after 64 years of age.  If you are overweight and have a high risk for diabetes, consider being tested at a younger age or more often. PREVENTING INFECTION  Hepatitis B  If you have a higher risk for hepatitis B, you should be screened for this virus. You are considered at high risk for hepatitis B if:  You were born in a country where hepatitis B is common. Ask your health care provider which countries are considered high risk.  Your parents were born in a high-risk country, and you have not been immunized against hepatitis B (hepatitis B vaccine).  You have HIV or AIDS.  You use needles to inject street drugs.  You live with someone who has hepatitis B.  You have had sex with someone who has hepatitis B.  You get hemodialysis treatment.  You take certain medicines for conditions, including cancer, organ transplantation, and autoimmune conditions. Hepatitis C  Blood testing is recommended for:  Everyone born from [birth year] through 1964.  Anyone with known risk factors for hepatitis C. Sexually transmitted infections (STIs)  You should be screened for sexually transmitted infections (STIs) including gonorrhea and chlamydia if:  You are sexually active and are younger than 64 years of age.  You are older than 64 years of age and your health care provider tells you that you are at risk for this type of infection.  Your sexual activity has changed since you were last screened and you are at an increased risk for chlamydia or gonorrhea. Ask your health care provider if you are at risk.  If you do not have HIV, but are at risk, it may be recommended that you take a prescription medicine daily to prevent HIV infection. This is called pre-exposure prophylaxis (PrEP). You are considered at risk if:  You are sexually active and do not regularly use condoms or know the HIV status of your partner(s).  You take drugs by injection.  You are sexually  active with a partner who has HIV. Talk with your health care provider about whether you are at high risk of being infected with HIV. If you choose to begin PrEP, you should first be tested for HIV. You should then be tested every 3 months for as long as you are taking PrEP.  PREGNANCY   If you are premenopausal and you may become pregnant, ask your health care provider about preconception counseling.  If you may  PrEP). You are considered at risk if:    You are sexually active and do not regularly use condoms or know the HIV status of your partner(s).    You take drugs by injection.    You are sexually  active with a partner who has HIV.  Talk with your health care provider about whether you are at high risk of being infected with HIV. If you choose to begin PrEP, you should first be tested for HIV. You should then be tested every 3 months for as long as you are taking PrEP.   PREGNANCY   · If you are premenopausal and you may become pregnant, ask your health care provider about preconception counseling.  · If you may become pregnant, take 400 to 800 micrograms (mcg) of folic acid every day.  · If you want to prevent pregnancy, talk to your health care provider about birth control (contraception).  OSTEOPOROSIS AND MENOPAUSE   · Osteoporosis is a disease in which the bones lose minerals and strength with aging. This can result in serious bone fractures. Your risk for osteoporosis can be identified using a bone density scan.  · If you are 65 years of age or older, or if you are at risk for osteoporosis and fractures, ask your health care provider if you should be screened.  · Ask your health care provider whether you should take a calcium or vitamin D supplement to lower your risk for osteoporosis.  · Menopause may have certain physical symptoms and risks.  · Hormone replacement therapy may reduce some of these symptoms and risks.  Talk to your health care provider about whether hormone replacement therapy is right for you.   HOME CARE INSTRUCTIONS   · Schedule regular health, dental, and eye exams.  · Stay current with your immunizations.    · Do not use any tobacco products including cigarettes, chewing tobacco, or electronic cigarettes.  · If you are pregnant, do not drink alcohol.  · If you are breastfeeding, limit how much and how often you drink alcohol.  · Limit alcohol intake to no more than 1 drink per day for nonpregnant women. One drink equals 12 ounces of beer, 5 ounces of wine, or 1½ ounces of hard liquor.  · Do not use street drugs.  · Do not share needles.  · Ask your health care provider for help if  you need support or information about quitting drugs.  · Tell your health care provider if you often feel depressed.  · Tell your health care provider if you have ever been abused or do not feel safe at home.     This information is not intended to replace advice given to you by your health care provider. Make sure you discuss any questions you have with your health care provider.     Document Released: 12/06/2010 Document Revised: 06/13/2014 Document Reviewed: 04/24/2013  Elsevier Interactive Patient Education ©2016 [employer]  Menopause is a normal process in which your reproductive ability comes to an end. This process happens gradually over a span of months to years, usually between the ages of 48 and 55. Menopause is complete when you have missed 12 consecutive menstrual periods.  It is important to talk with your health care provider about some of the most common conditions that affect postmenopausal women, such as heart disease, cancer, and bone loss (osteoporosis). Adopting a healthy lifestyle and getting preventive care can help to promote your health and wellness. Those actions can also   lower your chances of developing some of these common conditions.  WHAT SHOULD I KNOW ABOUT MENOPAUSE?  During menopause, you may experience a number of symptoms, such as:  · Moderate-to-severe hot flashes.  · Night sweats.  · Decrease in sex drive.  · Mood swings.  · Headaches.  · Tiredness.  · Irritability.  · Memory problems.  · Insomnia.  Choosing to treat or not to treat menopausal changes is an individual decision that you make with your health care provider.  WHAT SHOULD I KNOW ABOUT HORMONE REPLACEMENT THERAPY AND SUPPLEMENTS?  Hormone therapy products are effective for treating symptoms that are associated with menopause, such as hot flashes and night sweats. Hormone replacement carries certain risks, especially as you become older. If you are thinking about using estrogen or estrogen with progestin treatments,  discuss the benefits and risks with your health care provider.  WHAT SHOULD I KNOW ABOUT HEART DISEASE AND STROKE?  Heart disease, heart attack, and stroke become more likely as you age. This may be due, in part, to the hormonal changes that your body experiences during menopause. These can affect how your body processes dietary fats, triglycerides, and cholesterol. Heart attack and stroke are both medical emergencies.  There are many things that you can do to help prevent heart disease and stroke:  · Have your blood pressure checked at least every 1-2 years. High blood pressure causes heart disease and increases the risk of stroke.  · If you are 55-79 years old, ask your health care provider if you should take aspirin to prevent a heart attack or a stroke.  · Do not use any tobacco products, including cigarettes, chewing tobacco, or electronic cigarettes. If you need help quitting, ask your health care provider.  · It is important to eat a healthy diet and maintain a healthy weight.    Be sure to include plenty of vegetables, fruits, low-fat dairy products, and lean protein.    Avoid eating foods that are high in solid fats, added sugars, or salt (sodium).  · Get regular exercise. This is one of the most important things that you can do for your health.    Try to exercise for at least 150 minutes each week. The type of exercise that you do should increase your heart rate and make you sweat. This is known as moderate-intensity exercise.    Try to do strengthening exercises at least twice each week. Do these in addition to the moderate-intensity exercise.  · Know your numbers. Ask your health care provider to check your cholesterol and your blood glucose. Continue to have your blood tested as directed by your health care provider.  WHAT SHOULD I KNOW ABOUT CANCER SCREENING?  There are several types of cancer. Take the following steps to reduce your risk and to catch any cancer development as early as  possible.  Breast Cancer  · Practice breast self-awareness.    This means understanding how your breasts normally appear and feel.    It also means doing regular breast self-exams. Let your health care provider know about any changes, no matter how small.  · If you are 40 or older, have a clinician do a breast exam (clinical breast exam or CBE) every year. Depending on your age, family history, and medical history, it may be recommended that you also have a yearly breast X-ray (mammogram).  · If you have a family history of breast cancer, talk with your health care provider about genetic screening.  ·   If you are at high risk for breast cancer, talk with your health care provider about having an MRI and a mammogram every year.  · Breast cancer (BRCA) gene test is recommended for women who have family members with BRCA-related cancers. Results of the assessment will determine the need for genetic counseling and BRCA1 and for BRCA2 testing. BRCA-related cancers include these types:    Breast. This occurs in males or females.    Ovarian.    Tubal. This may also be called fallopian tube cancer.    Cancer of the abdominal or pelvic lining (peritoneal cancer).    Prostate.    Pancreatic.  Cervical, Uterine, and Ovarian Cancer  Your health care provider may recommend that you be screened regularly for cancer of the pelvic organs. These include your ovaries, uterus, and vagina. This screening involves a pelvic exam, which includes checking for microscopic changes to the surface of your cervix (Pap test).  · For women ages 21-65, health care providers may recommend a pelvic exam and a Pap test every three years. For women ages 30-65, they may recommend the Pap test and pelvic exam, combined with testing for human papilloma virus (HPV), every five years. Some types of HPV increase your risk of cervical cancer. Testing for HPV may also be done on women of any age who have unclear Pap test results.  · Other health care providers  may not recommend any screening for nonpregnant women who are considered low risk for pelvic cancer and have no symptoms. Ask your health care provider if a screening pelvic exam is right for you.  · If you have had past treatment for cervical cancer or a condition that could lead to cancer, you need Pap tests and screening for cancer for at least 20 years after your treatment. If Pap tests have been discontinued for you, your risk factors (such as having a new sexual partner) need to be reassessed to determine if you should start having screenings again. Some women have medical problems that increase the chance of getting cervical cancer. In these cases, your health care provider may recommend that you have screening and Pap tests more often.  · If you have a family history of uterine cancer or ovarian cancer, talk with your health care provider about genetic screening.  · If you have vaginal bleeding after reaching menopause, tell your health care provider.  · There are currently no reliable tests available to screen for ovarian cancer.  Lung Cancer  Lung cancer screening is recommended for adults 55-80 years old who are at high risk for lung cancer because of a history of smoking. A yearly low-dose CT scan of the lungs is recommended if you:  · Currently smoke.  · Have a history of at least 30 pack-years of smoking and you currently smoke or have quit within the past 15 years. A pack-year is smoking an average of one pack of cigarettes per day for one year.  Yearly screening should:  · Continue until it has been 15 years since you quit.  · Stop if you develop a health problem that would prevent you from having lung cancer treatment.  Colorectal Cancer  · This type of cancer can be detected and can often be prevented.  · Routine colorectal cancer screening usually begins at age 50 and continues through age 75.  · If you have risk factors for colon cancer, your health care provider may recommend that you be  screened at an earlier age.  ·   wearing long sleeves, pants, a wide-brimmed hat, and sunglasses. WHAT SHOULD I KNOW ABOUT OSTEOPOROSIS? Osteoporosis is a condition in which bone destruction happens more quickly than new bone creation. After menopause, you may be at an increased risk for osteoporosis. To help prevent osteoporosis or the bone fractures that can happen because of osteoporosis, the following is recommended:  If you are 8-11 years old, get at least 1,000 mg of calcium and at least 600 mg of vitamin D per day.  If you are older than age 62 but younger than age 68, get at least 1,200 mg of calcium and at least 600 mg of vitamin D per day.  If you are older than  age 59, get at least 1,200 mg of calcium and at least 800 mg of vitamin D per day. Smoking and excessive alcohol intake increase the risk of osteoporosis. Eat foods that are rich in calcium and vitamin D, and do weight-bearing exercises several times each week as directed by your health care provider. WHAT SHOULD I KNOW ABOUT HOW MENOPAUSE AFFECTS [place]? Depression may occur at any age, but it is more common as you become older. Common symptoms of depression include:  Low or sad mood.  Changes in sleep patterns.  Changes in appetite or eating patterns.  Feeling an overall lack of motivation or enjoyment of activities that you previously enjoyed.  Frequent crying spells. Talk with your health care provider if you think that you are experiencing depression. WHAT SHOULD I KNOW ABOUT IMMUNIZATIONS? It is important that you get and maintain your immunizations. These include:  Tetanus, diphtheria, and pertussis (Tdap) booster vaccine.  Influenza every year before the flu season begins.  Pneumonia vaccine.  Shingles vaccine. Your health care provider may also recommend other immunizations.   This information is not intended to replace advice given to you by your health care provider. Make sure you discuss any questions you have with your health care provider.   Document Released: 07/15/2005 Document Revised: 06/13/2014 Document Reviewed: 01/23/2014 Elsevier Interactive Patient Education 2016 [employer]. Tdap Vaccine (Tetanus, Diphtheria and Pertussis): What You Need to Know 1. Why get vaccinated? Tetanus, diphtheria and pertussis are very serious diseases. Tdap vaccine can protect [place] from these diseases. And, Tdap vaccine given to pregnant women can protect newborn babies against pertussis. TETANUS (Lockjaw) is rare in the [place] today. It causes painful muscle tightening and stiffness, usually all over the body.  It can lead to tightening of muscles in the head and  neck so you can't open your mouth, swallow, or sometimes even breathe. Tetanus kills about 1 out of 10 people who are infected even after receiving the best medical care. DIPHTHERIA is also rare in the [place] today. It can cause a thick coating to form in the back of the throat.  It can lead to breathing problems, heart failure, paralysis, and death. PERTUSSIS (Whooping Cough) causes severe coughing spells, which can cause difficulty breathing, vomiting and disturbed sleep.  It can also lead to weight loss, incontinence, and rib fractures. Up to 2 in 100 adolescents and 5 in 100 adults with pertussis are hospitalized or have complications, which could include pneumonia or death. These diseases are caused by bacteria. Diphtheria and pertussis are spread from person to person through secretions from coughing or sneezing. Tetanus enters the body through cuts, scratches, or wounds. Before vaccines, as many as 200,000 cases of diphtheria, 200,000 cases of pertussis, and hundreds of cases of tetanus, were reported  in the [place] each year. Since vaccination began, reports of cases for tetanus and diphtheria have dropped by about 99% and for pertussis by about 80%. 2. Tdap vaccine Tdap vaccine can protect adolescents and adults from tetanus, diphtheria, and pertussis. One dose of Tdap is routinely given at age 79 or 57. People who did not get Tdap at that age should get it as soon as possible. Tdap is especially important for healthcare professionals and anyone having close contact with a baby younger than 12 months. Pregnant women should get a dose of Tdap during every pregnancy, to protect the newborn from pertussis. Infants are most at risk for severe, life-threatening complications from pertussis. Another vaccine, called Td, protects against tetanus and diphtheria, but not pertussis. A Td booster should be given every 10 years. Tdap may be given as one of these boosters if you have never  gotten Tdap before. Tdap may also be given after a severe cut or burn to prevent tetanus infection. Your doctor or the person giving you the vaccine can give you more information. Tdap may safely be given at the same time as other vaccines. 3. Some people should not get this vaccine  A person who has ever had a life-threatening allergic reaction after a previous dose of any diphtheria, tetanus or pertussis containing vaccine, OR has a severe allergy to any part of this vaccine, should not get Tdap vaccine. Tell the person giving the vaccine about any severe allergies.  Anyone who had coma or long repeated seizures within 7 days after a childhood dose of DTP or DTaP, or a previous dose of Tdap, should not get Tdap, unless a cause other than the vaccine was found. They can still get Td.  Talk to your doctor if you:  have seizures or another nervous system problem,  had severe pain or swelling after any vaccine containing diphtheria, tetanus or pertussis,  ever had a condition called Guillain-Barr Syndrome (GBS),  aren't feeling well on the day the shot is scheduled. 4. Risks With any medicine, including vaccines, there is a chance of side effects. These are usually mild and go away on their own. Serious reactions are also possible but are rare. Most people who get Tdap vaccine do not have any problems with it. Mild problems following Tdap (Did not interfere with activities)  Pain where the shot was given (about 3 in 4 adolescents or 2 in 3 adults)  Redness or swelling where the shot was given (about 1 person in 5)  Mild fever of at least 100.23F (up to about 1 in 25 adolescents or 1 in 100 adults)  Headache (about 3 or 4 people in 10)  Tiredness (about 1 person in 3 or 4)  Nausea, vomiting, diarrhea, stomach ache (up to 1 in 4 adolescents or 1 in 10 adults)  Chills, sore joints (about 1 person in 10)  Body aches (about 1 person in 3 or 4)  Rash, swollen glands  (uncommon) Moderate problems following Tdap (Interfered with activities, but did not require medical attention)  Pain where the shot was given (up to 1 in 5 or 6)  Redness or swelling where the shot was given (up to about 1 in 16 adolescents or 1 in 12 adults)  Fever over 102F (about 1 in 100 adolescents or 1 in 250 adults)  Headache (about 1 in 7 adolescents or 1 in 10 adults)  Nausea, vomiting, diarrhea, stomach ache (up to 1 or 3 people in 100)  Swelling  of the entire arm where the shot was given (up to about 1 in 500). Severe problems following Tdap (Unable to perform usual activities; required medical attention)  Swelling, severe pain, bleeding and redness in the arm where the shot was given (rare). Problems that could happen after any vaccine:  People sometimes faint after a medical procedure, including vaccination. Sitting or lying down for about 15 minutes can help prevent fainting, and injuries caused by a fall. Tell your doctor if you feel dizzy, or have vision changes or ringing in the ears.  Some people get severe pain in the shoulder and have difficulty moving the arm where a shot was given. This happens very rarely.  Any medication can cause a severe allergic reaction. Such reactions from a vaccine are very rare, estimated at fewer than 1 in a million doses, and would happen within a few minutes to a few hours after the vaccination. As with any medicine, there is a very remote chance of a vaccine causing a serious injury or death. The safety of vaccines is always being monitored. For more information, visit: http://www.aguilar.org/ 5. What if there is a serious problem? What should I look for?  Look for anything that concerns you, such as signs of a severe allergic reaction, very high fever, or unusual behavior.  Signs of a severe allergic reaction can include hives, swelling of the face and throat, difficulty breathing, a fast heartbeat, dizziness, and weakness.  These would usually start a few minutes to a few hours after the vaccination. What should I do?  If you think it is a severe allergic reaction or other emergency that can't wait, call 9-1-1 or get the person to the nearest hospital. Otherwise, call your doctor.  Afterward, the reaction should be reported to the Vaccine Adverse Event Reporting System (VAERS). Your doctor might file this report, or you can do it yourself through the VAERS web site at www.vaers.SamedayNews.es, or by calling 630-473-1878. VAERS does not give medical advice.  6. The National Vaccine Injury Compensation Program The [employer] Vaccine Injury Compensation Program (VICP) is a federal program that was created to compensate people who may have been injured by certain vaccines. Persons who believe they may have been injured by a vaccine can learn about the program and about filing a claim by calling 804-749-1216 or visiting the [place] website at [email or web address].ee. There is a time limit to file a claim for compensation. 7. How can I learn more?  Ask your doctor. He or she can give you the vaccine package insert or suggest other sources of information.  Call your local or state health department.  Contact the Centers for Disease Control and Prevention (CDC):  Call 7072552545 (1-800-CDC-INFO) or  Visit CDC's website at http://hunter.com/ CDC Tdap Vaccine VIS (07/30/13)   This information is not intended to replace advice given to you by your health care provider. Make sure you discuss any questions you have with your health care provider.   Document Released: 11/22/2011 Document Revised: 06/13/2014 Document Reviewed: 09/04/2013 Elsevier Interactive Patient Education 2016 [employer]. Tdap Vaccine (Tetanus, Diphtheria and Pertussis): What You Need to Know 1. Why get vaccinated? Tetanus, diphtheria and pertussis are very serious diseases. Tdap vaccine can protect [place] from these diseases. And, Tdap vaccine  given to pregnant women can protect newborn babies against pertussis. TETANUS (Lockjaw) is rare in the [place] today. It causes painful muscle tightening and stiffness, usually all over the body.  It can lead to tightening of muscles in  the head and neck so you can't open your mouth, swallow, or sometimes even breathe. Tetanus kills about 1 out of 10 people who are infected even after receiving the best medical care. DIPHTHERIA is also rare in the [place] today. It can cause a thick coating to form in the back of the throat.  It can lead to breathing problems, heart failure, paralysis, and death. PERTUSSIS (Whooping Cough) causes severe coughing spells, which can cause difficulty breathing, vomiting and disturbed sleep.  It can also lead to weight loss, incontinence, and rib fractures. Up to 2 in 100 adolescents and 5 in 100 adults with pertussis are hospitalized or have complications, which could include pneumonia or death. These diseases are caused by bacteria. Diphtheria and pertussis are spread from person to person through secretions from coughing or sneezing. Tetanus enters the body through cuts, scratches, or wounds. Before vaccines, as many as 200,000 cases of diphtheria, 200,000 cases of pertussis, and hundreds of cases of tetanus, were reported in the [place] each year. Since vaccination began, reports of cases for tetanus and diphtheria have dropped by about 99% and for pertussis by about 80%. 2. Tdap vaccine Tdap vaccine can protect adolescents and adults from tetanus, diphtheria, and pertussis. One dose of Tdap is routinely given at age 22 or 70. People who did not get Tdap at that age should get it as soon as possible. Tdap is especially important for healthcare professionals and anyone having close contact with a baby younger than 12 months. Pregnant women should get a dose of Tdap during every pregnancy, to protect the newborn from pertussis. Infants are most at risk  for severe, life-threatening complications from pertussis. Another vaccine, called Td, protects against tetanus and diphtheria, but not pertussis. A Td booster should be given every 10 years. Tdap may be given as one of these boosters if you have never gotten Tdap before. Tdap may also be given after a severe cut or burn to prevent tetanus infection. Your doctor or the person giving you the vaccine can give you more information. Tdap may safely be given at the same time as other vaccines. 3. Some people should not get this vaccine  A person who has ever had a life-threatening allergic reaction after a previous dose of any diphtheria, tetanus or pertussis containing vaccine, OR has a severe allergy to any part of this vaccine, should not get Tdap vaccine. Tell the person giving the vaccine about any severe allergies.  Anyone who had coma or long repeated seizures within 7 days after a childhood dose of DTP or DTaP, or a previous dose of Tdap, should not get Tdap, unless a cause other than the vaccine was found. They can still get Td.  Talk to your doctor if you:  have seizures or another nervous system problem,  had severe pain or swelling after any vaccine containing diphtheria, tetanus or pertussis,  ever had a condition called Guillain-Barr Syndrome (GBS),  aren't feeling well on the day the shot is scheduled. 4. Risks With any medicine, including vaccines, there is a chance of side effects. These are usually mild and go away on their own. Serious reactions are also possible but are rare. Most people who get Tdap vaccine do not have any problems with it. Mild problems following Tdap (Did not interfere with activities)  Pain where the shot was given (about 3 in 4 adolescents or 2 in 3 adults)  Redness or swelling where the shot was given (about 1 person  in 5)  Mild fever of at least 100.77F (up to about 1 in 25 adolescents or 1 in 100 adults)  Headache (about 3 or 4 people in  10)  Tiredness (about 1 person in 3 or 4)  Nausea, vomiting, diarrhea, stomach ache (up to 1 in 4 adolescents or 1 in 10 adults)  Chills, sore joints (about 1 person in 10)  Body aches (about 1 person in 3 or 4)  Rash, swollen glands (uncommon) Moderate problems following Tdap (Interfered with activities, but did not require medical attention)  Pain where the shot was given (up to 1 in 5 or 6)  Redness or swelling where the shot was given (up to about 1 in 16 adolescents or 1 in 12 adults)  Fever over 102F (about 1 in 100 adolescents or 1 in 250 adults)  Headache (about 1 in 7 adolescents or 1 in 10 adults)  Nausea, vomiting, diarrhea, stomach ache (up to 1 or 3 people in 100)  Swelling of the entire arm where the shot was given (up to about 1 in 500). Severe problems following Tdap (Unable to perform usual activities; required medical attention)  Swelling, severe pain, bleeding and redness in the arm where the shot was given (rare). Problems that could happen after any vaccine:  People sometimes faint after a medical procedure, including vaccination. Sitting or lying down for about 15 minutes can help prevent fainting, and injuries caused by a fall. Tell your doctor if you feel dizzy, or have vision changes or ringing in the ears.  Some people get severe pain in the shoulder and have difficulty moving the arm where a shot was given. This happens very rarely.  Any medication can cause a severe allergic reaction. Such reactions from a vaccine are very rare, estimated at fewer than 1 in a million doses, and would happen within a few minutes to a few hours after the vaccination. As with any medicine, there is a very remote chance of a vaccine causing a serious injury or death. The safety of vaccines is always being monitored. For more information, visit: http://www.aguilar.org/ 5. What if there is a serious problem? What should I look for?  Look for anything that concerns  you, such as signs of a severe allergic reaction, very high fever, or unusual behavior.  Signs of a severe allergic reaction can include hives, swelling of the face and throat, difficulty breathing, a fast heartbeat, dizziness, and weakness. These would usually start a few minutes to a few hours after the vaccination. What should I do?  If you think it is a severe allergic reaction or other emergency that can't wait, call 9-1-1 or get the person to the nearest hospital. Otherwise, call your doctor.  Afterward, the reaction should be reported to the Vaccine Adverse Event Reporting System (VAERS). Your doctor might file this report, or you can do it yourself through the VAERS web site at www.vaers.SamedayNews.es, or by calling [phone]. VAERS does not give medical advice.  6. The National Vaccine Injury Compensation Program The [employer] Vaccine Injury Compensation Program (VICP) is a federal program that was created to compensate people who may have been injured by certain vaccines. Persons who believe they may have been injured by a vaccine can learn about the program and about filing a claim by calling (636)690-3711 or visiting the [place] website at GoldCloset.com.ee. There is a time limit to file a claim for compensation. 7. How can I learn more?  Ask your doctor. He or she  can give you the vaccine package insert or suggest other sources of information.  Call your local or state health department.  Contact the Centers for Disease Control and Prevention (CDC):  Call 704-319-9968 (1-800-CDC-INFO) or  Visit CDC's website at http://hunter.com/ CDC Tdap Vaccine VIS (07/30/13)   This information is not intended to replace advice given to you by your health care provider. Make sure you discuss any questions you have with your health care provider.   Document Released: 11/22/2011 Document Revised: 06/13/2014 Document Reviewed: 09/04/2013 Elsevier Interactive Patient Education  2016 [employer]. Menopause and Herbal Products WHAT IS MENOPAUSE? Menopause is the normal time of life when menstrual periods decrease in frequency and eventually stop completely. This process can take several years for some women. Menopause is complete when you have had an absence of menstruation for a full year since your last menstrual period. It usually occurs between the ages of 12 and 54. It is not common for menopause to begin before the age of 28. During menopause, your body stops producing the female hormones estrogen and progesterone. Common symptoms associated with this loss of hormones (vasomotor symptoms) are:  Hot flashes.  Hot flushes.  Night sweats. Other common symptoms and complications of menopause include:  Decrease in sex drive.  Vaginal dryness and thinning of the walls of the vagina. This can make sex painful.  Dryness of the skin and development of wrinkles.  Headaches.  Tiredness.  Irritability.  Memory problems.  Weight gain.  Bladder infections.  Hair growth on the face and chest.  Inability to reproduce offspring (infertility).  Loss of density in the bones (osteoporosis) increasing your risk for breaks (fractures).  Depression.  Hardening and narrowing of the arteries (atherosclerosis). This increases your risk of heart attack and stroke. WHAT TREATMENT OPTIONS ARE AVAILABLE? There are many treatment choices for menopause symptoms. The most common treatment is hormone replacement therapy. Many alternative therapies for menopause are emerging, including the use of herbal products. These supplements can be found in the form of herbs, teas, oils, tinctures, and pills. Common herbal supplements for menopause are made from plants that contain phytoestrogens. Phytoestrogens are compounds that occur naturally in plants and plant products. They act like estrogen in the body. Foods and herbs that contain phytoestrogens include:  Soy.  Flax  seeds.  Red clover.  Ginseng. WHAT MENOPAUSE SYMPTOMS MAY BE HELPED IF I USE HERBAL PRODUCTS?  Vasomotor symptoms. These may be helped by:  Soy. Some studies show that soy may have a moderate benefit for hot flashes.  Black cohosh. There is limited evidence indicating this may be beneficial for hot flashes.  Symptoms that are related to heart and blood vessel disease. These may be helped by soy. Studies have shown that soy can help to lower cholesterol.  Depression. This may be helped by:  St. John's wort. There is limited evidence that shows this may help mild to moderate depression.  Black cohosh. There is evidence that this may help depression and mood swings.  Osteoporosis. Soy may help to decrease bone loss that is associated with menopause and may prevent osteoporosis. Limited evidence indicates that red clover may offer some bone loss protection as well. Other herbal products that are commonly used during menopause lack enough evidence to support their use as a replacement for conventional menopause therapies. These products include evening primrose, ginseng, and red clover. WHAT ARE THE CASES WHEN HERBAL PRODUCTS SHOULD NOT BE USED DURING MENOPAUSE? Do not use herbal products during menopause  without your health care provider's approval if:  You are taking medicine.  You have a preexisting liver condition. ARE THERE ANY RISKS IN MY TAKING HERBAL PRODUCTS DURING MENOPAUSE? If you choose to use herbal products to help with symptoms of menopause, keep in mind that:  Different supplements have different and unmeasured amounts of herbal ingredients.  Herbal products are not regulated the same way that medicines are.  Concentrations of herbs may vary depending on the way they are prepared. For example, the concentration may be different in a pill, tea, oil, and tincture.  Little is known about the risks of using herbal products, particularly the risks of long-term use.  Some  herbal supplements can be harmful when combined with certain medicines. Most commonly reported side effects of herbal products are mild. However, if used improperly, many herbal supplements can cause serious problems. Talk to your health care provider before starting any herbal product. If problems develop, stop taking the supplement and let your health care provider know.   This information is not intended to replace advice given to you by your health care provider. Make sure you discuss any questions you have with your health care provider.   Document Released: 11/09/2007 Document Revised: 06/13/2014 Document Reviewed: 11/05/2013 Elsevier Interactive Patient Education [employer].

## 2016-03-03 ENCOUNTER — Telehealth: Payer: Self-pay | Admitting: Family Medicine

## 2016-03-03 LAB — CBC WITH DIFFERENTIAL/PLATELET
BASOS: 1 %
Basophils Absolute: 0 10*3/uL (ref 0.0–0.2)
EOS (ABSOLUTE): 0.3 10*3/uL (ref 0.0–0.4)
Eos: 5 %
HEMATOCRIT: 43.5 % (ref 34.0–46.6)
HEMOGLOBIN: 14 g/dL (ref 11.1–15.9)
IMMATURE GRANS (ABS): 0 10*3/uL (ref 0.0–0.1)
Immature Granulocytes: 0 %
LYMPHS: 35 %
Lymphocytes Absolute: 2 10*3/uL (ref 0.7–3.1)
MCH: 28.1 pg (ref 26.6–33.0)
MCHC: 32.2 g/dL (ref 31.5–35.7)
MCV: 87 fL (ref 79–97)
MONOCYTES: 11 %
Monocytes Absolute: 0.6 10*3/uL (ref 0.1–0.9)
NEUTROS ABS: 2.8 10*3/uL (ref 1.4–7.0)
Neutrophils: 48 %
Platelets: 259 10*3/uL (ref 150–379)
RBC: 4.99 x10E6/uL (ref 3.77–5.28)
RDW: 14.5 % (ref 12.3–15.4)
WBC: 5.6 10*3/uL (ref 3.4–10.8)

## 2016-03-03 LAB — COMPREHENSIVE METABOLIC PANEL
ALBUMIN: 4.3 g/dL (ref 3.6–4.8)
ALT: 34 IU/L — ABNORMAL HIGH (ref 0–32)
AST: 27 IU/L (ref 0–40)
Albumin/Globulin Ratio: 1.7 (ref 1.2–2.2)
Alkaline Phosphatase: 79 IU/L (ref 39–117)
BILIRUBIN TOTAL: 0.3 mg/dL (ref 0.0–1.2)
BUN / CREAT RATIO: 17 (ref 12–28)
BUN: 18 mg/dL (ref 8–27)
CO2: 19 mmol/L (ref 18–29)
CREATININE: 1.08 mg/dL — AB (ref 0.57–1.00)
Calcium: 9.5 mg/dL (ref 8.7–10.3)
Chloride: 103 mmol/L (ref 96–106)
GFR calc non Af Amer: 55 mL/min/{1.73_m2} — ABNORMAL LOW (ref >59)
GFR, EST AFRICAN AMERICAN: 63 mL/min/{1.73_m2} (ref >59)
GLUCOSE: 80 mg/dL (ref 65–99)
Globulin, Total: 2.6 g/dL (ref 1.5–4.5)
Potassium: 4.8 mmol/L (ref 3.5–5.2)
Sodium: 145 mmol/L — ABNORMAL HIGH (ref 134–144)
TOTAL PROTEIN: 6.9 g/dL (ref 6.0–8.5)

## 2016-03-03 LAB — LIPID PANEL W/O CHOL/HDL RATIO
Cholesterol, Total: 184 mg/dL (ref 100–199)
HDL: 58 mg/dL (ref >39)
LDL CALC: 111 mg/dL — AB (ref 0–99)
Triglycerides: 76 mg/dL (ref 0–149)
VLDL CHOLESTEROL CAL: 15 mg/dL (ref 5–40)

## 2016-03-03 LAB — TSH: TSH: 3.14 u[IU]/mL (ref 0.450–4.500)

## 2016-03-03 NOTE — Telephone Encounter (Signed)
Please let her know her labs were normal. Thanks!

## 2016-03-03 NOTE — Telephone Encounter (Signed)
Patient notified, exercises left up front.

## 2016-03-03 NOTE — Telephone Encounter (Signed)
Pt called stated Cologuard is not covered by her insurance. Is there anything else she can do? Please call and follow up with patient. Thanks.

## 2016-03-05 LAB — IGP, APTIMA HPV, RFX 16/18,45
HPV Aptima: NEGATIVE
PAP SMEAR COMMENT: 0

## 2016-03-07 ENCOUNTER — Encounter: Payer: Self-pay | Admitting: Family Medicine

## 2016-03-16 ENCOUNTER — Other Ambulatory Visit (INDEPENDENT_AMBULATORY_CARE_PROVIDER_SITE_OTHER): Payer: BLUE CROSS/BLUE SHIELD

## 2016-03-16 ENCOUNTER — Telehealth: Payer: Self-pay

## 2016-03-16 DIAGNOSIS — Z1211 Encounter for screening for malignant neoplasm of colon: Secondary | ICD-10-CM

## 2016-03-16 LAB — FECAL OCCULT BLOOD, GUAIAC: Fecal Occult Blood: NEGATIVE

## 2016-03-16 LAB — IFOBT (OCCULT BLOOD)
IMMUNOLOGICAL FECAL OCCULT BLOOD TEST: NEGATIVE
SPECIMEN 1: NEGATIVE
Specimen 2: NEGATIVE
Specimen 3: NEGATIVE

## 2016-03-16 NOTE — Telephone Encounter (Signed)
Called patient to let her know that her stool studies were negative. No answer, unable to leave a message, will try again.

## 2016-03-18 ENCOUNTER — Encounter: Payer: Self-pay | Admitting: Family Medicine

## 2016-03-18 NOTE — Telephone Encounter (Signed)
Letter printed and sent.  

## 2016-03-18 NOTE — Telephone Encounter (Signed)
Letter in chart. OK to print and send. Thanks!

## 2016-03-18 NOTE — Telephone Encounter (Signed)
Dr.Johnson, I have tried to call this patient daily without a answer and unable to leave a message. What would you like to do?

## 2016-03-22 ENCOUNTER — Telehealth: Payer: Self-pay

## 2016-03-22 NOTE — Telephone Encounter (Signed)
Didn't we cancel that because she didn't do it because of cost?

## 2016-03-22 NOTE — Telephone Encounter (Signed)
I think we did, I know she did stool cards that were negative.

## 2016-03-22 NOTE — Telephone Encounter (Signed)
Received a fax stating that patients Cologuard sample could not be processed.

## 2016-04-21 ENCOUNTER — Ambulatory Visit (INDEPENDENT_AMBULATORY_CARE_PROVIDER_SITE_OTHER): Payer: BLUE CROSS/BLUE SHIELD

## 2016-04-21 DIAGNOSIS — Z23 Encounter for immunization: Secondary | ICD-10-CM | POA: Diagnosis not present

## 2016-08-20 ENCOUNTER — Other Ambulatory Visit: Payer: Self-pay | Admitting: Family Medicine

## 2016-08-30 ENCOUNTER — Encounter: Payer: Self-pay | Admitting: Family Medicine

## 2016-08-30 ENCOUNTER — Ambulatory Visit (INDEPENDENT_AMBULATORY_CARE_PROVIDER_SITE_OTHER): Payer: BLUE CROSS/BLUE SHIELD | Admitting: Family Medicine

## 2016-08-30 VITALS — BP 135/88 | HR 65 | Temp 98.3°F | Resp 17 | Ht 61.7 in | Wt 145.0 lb

## 2016-08-30 DIAGNOSIS — E782 Mixed hyperlipidemia: Secondary | ICD-10-CM

## 2016-08-30 DIAGNOSIS — I129 Hypertensive chronic kidney disease with stage 1 through stage 4 chronic kidney disease, or unspecified chronic kidney disease: Secondary | ICD-10-CM | POA: Diagnosis not present

## 2016-08-30 MED ORDER — ATORVASTATIN CALCIUM 20 MG PO TABS: 20.0000 mg | ORAL_TABLET | Freq: Every day | ORAL | 1 refills | Status: DC

## 2016-08-30 MED ORDER — LISINOPRIL 10 MG PO TABS: ORAL_TABLET | ORAL | 1 refills | Status: DC

## 2016-08-30 NOTE — Assessment & Plan Note (Signed)
Under fair control. Continue current regimen. Continue to monitor. Call with any concerns. Refills given today.

## 2016-08-30 NOTE — Assessment & Plan Note (Signed)
Rechecking levels today. Will adjust dose as needed. Continue to monitor. Refills given.

## 2016-08-30 NOTE — Progress Notes (Signed)
BP 135/88 (BP Location: Right Arm, Patient Position: Sitting, Cuff Size: Normal)   Pulse 65   Temp 98.3 F (36.8 C) (Oral)   Resp 17   Ht 5' 1.7" (1.567 m)   Wt 145 lb (65.8 kg)   SpO2 97%   BMI 26.78 kg/m    Subjective:    Patient ID: Jasmine Church, female    DOB: September 12, 1950, 66 y.o.   MRN: [id number]  HPI: Jasmine Church is a 66 y.o. female  Chief Complaint  Patient presents with  . Hypertension  . Hyperlipidemia   HYPERTENSION / HYPERLIPIDEMIA Satisfied with current treatment? yes Duration of hypertension: chronic BP monitoring frequency: not checking BP medication side effects: no Past BP meds: lisinopril Duration of hyperlipidemia: chronic Cholesterol medication side effects: no Cholesterol supplements: none Past cholesterol medications: atorvastain (lipitor) Medication compliance: excellent compliance Aspirin: yes Recent stressors: no Recurrent headaches: no Visual changes: no Palpitations: no Dyspnea: no Chest pain: no Lower extremity edema: no Dizzy/lightheaded: no   Relevant past medical, surgical, family and social history reviewed and updated as indicated. Interim medical history since our last visit reviewed. Allergies and medications reviewed and updated.  Review of Systems  Constitutional: Negative.   Respiratory: Negative.   Cardiovascular: Negative.   Psychiatric/Behavioral: Negative.     Per HPI unless specifically indicated above     Objective:    BP 135/88 (BP Location: Right Arm, Patient Position: Sitting, Cuff Size: Normal)   Pulse 65   Temp 98.3 F (36.8 C) (Oral)   Resp 17   Ht 5' 1.7" (1.567 m)   Wt 145 lb (65.8 kg)   SpO2 97%   BMI 26.78 kg/m   Wt Readings from Last 3 Encounters:  08/30/16 145 lb (65.8 kg)  03/02/16 143 lb (64.9 kg)  03/02/15 143 lb (64.9 kg)    Physical Exam  Constitutional: She is oriented to person, place, and time. She appears well-developed and well-nourished. No distress.  HENT:  Head:  Normocephalic and atraumatic.  Right Ear: Hearing normal.  Left Ear: Hearing normal.  Nose: Nose normal.  Eyes: Conjunctivae and lids are normal. Right eye exhibits no discharge. Left eye exhibits no discharge. No scleral icterus.  Cardiovascular: Normal rate, regular rhythm, normal heart sounds and intact distal pulses.  Exam reveals no gallop and no friction rub.   No murmur heard. Pulmonary/Chest: Effort normal and breath sounds normal. No respiratory distress. She has no wheezes. She has no rales. She exhibits no tenderness.  Musculoskeletal: Normal range of motion.  Neurological: She is alert and oriented to person, place, and time.  Skin: Skin is warm, dry and intact. No rash noted. No erythema. No pallor.  Psychiatric: She has a normal mood and affect. Her speech is normal and behavior is normal. Judgment and thought content normal. Cognition and memory are normal.  Nursing note and vitals reviewed.   Results for orders placed or performed in visit on 03/16/16  Fecal Occult Blood, Guaiac  Result Value Ref Range   Fecal Occult Blood Negative       Assessment & Plan:   Problem List Items Addressed This Visit      Genitourinary   Benign hypertensive renal disease    Under fair control. Continue current regimen. Continue to monitor. Call with any concerns. Refills given today.      Relevant Orders   Comprehensive metabolic panel     Other   Hyperlipidemia - Primary    Rechecking levels today. Will adjust  dose as needed. Continue to monitor. Refills given.       Relevant Medications   lisinopril (PRINIVIL,ZESTRIL) 10 MG tablet   atorvastatin (LIPITOR) 20 MG tablet   Other Relevant Orders   Comprehensive metabolic panel   Lipid Panel w/o Chol/HDL Ratio       Follow up plan: Return in about 6 months (around 03/02/2017) for Physical.

## 2016-08-31 ENCOUNTER — Encounter: Payer: Self-pay | Admitting: Family Medicine

## 2016-08-31 LAB — COMPREHENSIVE METABOLIC PANEL
ALK PHOS: 80 IU/L (ref 39–117)
ALT: 25 IU/L (ref 0–32)
AST: 21 IU/L (ref 0–40)
Albumin/Globulin Ratio: 1.6 (ref 1.2–2.2)
Albumin: 4.2 g/dL (ref 3.6–4.8)
BUN/Creatinine Ratio: 9 — ABNORMAL LOW (ref 12–28)
BUN: 10 mg/dL (ref 8–27)
Bilirubin Total: 0.3 mg/dL (ref 0.0–1.2)
CHLORIDE: 104 mmol/L (ref 96–106)
CO2: 23 mmol/L (ref 18–29)
Calcium: 9.7 mg/dL (ref 8.7–10.3)
Creatinine, Ser: 1.09 mg/dL — ABNORMAL HIGH (ref 0.57–1.00)
GFR, EST AFRICAN AMERICAN: 62 mL/min/{1.73_m2} (ref >59)
GFR, EST NON AFRICAN AMERICAN: 54 mL/min/{1.73_m2} — AB (ref >59)
GLOBULIN, TOTAL: 2.6 g/dL (ref 1.5–4.5)
GLUCOSE: 106 mg/dL — AB (ref 65–99)
POTASSIUM: 5.4 mmol/L — AB (ref 3.5–5.2)
SODIUM: 144 mmol/L (ref 134–144)
Total Protein: 6.8 g/dL (ref 6.0–8.5)

## 2016-08-31 LAB — LIPID PANEL W/O CHOL/HDL RATIO
Cholesterol, Total: 171 mg/dL (ref 100–199)
HDL: 60 mg/dL (ref >39)
LDL Calculated: 97 mg/dL (ref 0–99)
Triglycerides: 69 mg/dL (ref 0–149)
VLDL Cholesterol Cal: 14 mg/dL (ref 5–40)

## 2016-10-22 ENCOUNTER — Other Ambulatory Visit: Payer: Self-pay | Admitting: Family Medicine

## 2016-12-26 ENCOUNTER — Other Ambulatory Visit: Payer: Self-pay | Admitting: Family Medicine

## 2016-12-26 DIAGNOSIS — Z1231 Encounter for screening mammogram for malignant neoplasm of breast: Secondary | ICD-10-CM

## 2017-01-26 ENCOUNTER — Ambulatory Visit
Admission: RE | Admit: 2017-01-26 | Discharge: 2017-01-26 | Disposition: A | Discharge status: Home / Self Care | Payer: BLUE CROSS/BLUE SHIELD | Source: Ambulatory Visit | Attending: Family Medicine | Admitting: Family Medicine

## 2017-01-26 DIAGNOSIS — Z1231 Encounter for screening mammogram for malignant neoplasm of breast: Secondary | ICD-10-CM

## 2017-01-27 ENCOUNTER — Encounter: Payer: Self-pay | Admitting: Family Medicine

## 2017-02-18 ENCOUNTER — Other Ambulatory Visit: Payer: Self-pay | Admitting: Family Medicine

## 2017-02-20 NOTE — Telephone Encounter (Signed)
Your patient 

## 2017-03-07 ENCOUNTER — Ambulatory Visit (INDEPENDENT_AMBULATORY_CARE_PROVIDER_SITE_OTHER): Payer: BLUE CROSS/BLUE SHIELD | Admitting: Family Medicine

## 2017-03-07 ENCOUNTER — Encounter: Payer: Self-pay | Admitting: Family Medicine

## 2017-03-07 VITALS — BP 138/80 | HR 70 | Temp 97.5°F | Ht 62.4 in | Wt 144.6 lb

## 2017-03-07 DIAGNOSIS — E782 Mixed hyperlipidemia: Secondary | ICD-10-CM | POA: Diagnosis not present

## 2017-03-07 DIAGNOSIS — Z23 Encounter for immunization: Secondary | ICD-10-CM

## 2017-03-07 DIAGNOSIS — I129 Hypertensive chronic kidney disease with stage 1 through stage 4 chronic kidney disease, or unspecified chronic kidney disease: Secondary | ICD-10-CM | POA: Diagnosis not present

## 2017-03-07 DIAGNOSIS — Z114 Encounter for screening for human immunodeficiency virus [HIV]: Secondary | ICD-10-CM | POA: Diagnosis not present

## 2017-03-07 DIAGNOSIS — N183 Chronic kidney disease, stage 3 unspecified: Secondary | ICD-10-CM

## 2017-03-07 DIAGNOSIS — Z0000 Encounter for general adult medical examination without abnormal findings: Secondary | ICD-10-CM | POA: Diagnosis not present

## 2017-03-07 DIAGNOSIS — Z1159 Encounter for screening for other viral diseases: Secondary | ICD-10-CM | POA: Diagnosis not present

## 2017-03-07 LAB — UA/M W/RFLX CULTURE, ROUTINE
Bilirubin, UA: NEGATIVE
GLUCOSE, UA: NEGATIVE
Ketones, UA: NEGATIVE
NITRITE UA: NEGATIVE
Protein, UA: NEGATIVE
Specific Gravity, UA: 1.02 (ref 1.005–1.030)
Urobilinogen, Ur: 0.2 mg/dL (ref 0.2–1.0)
pH, UA: 5 (ref 5.0–7.5)

## 2017-03-07 LAB — MICROALBUMIN, URINE WAIVED
Creatinine, Urine Waived: 200 mg/dL (ref 10–300)
Microalb, Ur Waived: 10 mg/L (ref 0–19)
Microalb/Creat Ratio: <30 mg/g (ref <30)

## 2017-03-07 LAB — MICROSCOPIC EXAMINATION: Bacteria, UA: NONE SEEN

## 2017-03-07 MED ORDER — ATORVASTATIN CALCIUM 20 MG PO TABS: ORAL_TABLET | ORAL | 1 refills | Status: DC

## 2017-03-07 MED ORDER — LISINOPRIL 10 MG PO TABS: ORAL_TABLET | ORAL | 1 refills | Status: DC

## 2017-03-07 NOTE — Progress Notes (Signed)
BP 138/80 (BP Location: Left Arm, Cuff Size: Normal)   Pulse 70   Temp (!) 97.5 F (36.4 C)   Ht 5' 2.4" (1.585 m)   Wt 144 lb 9 oz (65.6 kg)   SpO2 99%   BMI 26.10 kg/m    Subjective:    Patient ID: Jasmine Church, female    DOB: 1950/07/25, 66 y.o.   MRN: [id number]  HPI: Jasmine Church is a 66 y.o. female presenting on 03/07/2017 for comprehensive medical examination. Current medical complaints include:  HYPERTENSION / HYPERLIPIDEMIA Satisfied with current treatment? yes Duration of hypertension: chronic BP monitoring frequency: not checking BP medication side effects: no Past BP meds: lisinopril Duration of hyperlipidemia: chronic Cholesterol medication side effects: no Cholesterol supplements: none Past cholesterol medications: atorvastatin Medication compliance: excellent compliance Aspirin: yes Recent stressors: no Recurrent headaches: no Visual changes: no Palpitations: no Dyspnea: no Chest pain: no Lower extremity edema: no Dizzy/lightheaded: no  She currently lives with: husband and son Menopausal Symptoms: yes- no problems  Depression Screen done today and results listed below:  Depression screen [facility] 2/9 03/07/2017 03/02/2016 03/02/2016 03/02/2015  Decreased Interest 0 0 0 0  Down, Depressed, Hopeless 0 0 0 0  PHQ - 2 Score 0 0 0 0    Past Medical History:  Past Medical History:  Diagnosis Date  . Arthritis   . CKD (chronic kidney disease) stage 3, GFR 30-59 ml/min (HCC)   . Hyperlipidemia   . Hypertension   . Vaginal atrophy     Surgical History:  Past Surgical History:  Procedure Laterality Date  . HEMORROIDECTOMY    . HERNIA REPAIR      Medications:  Current Outpatient Prescriptions on File Prior to Visit  Medication Sig  . aspirin EC 81 MG tablet Take 81 mg by mouth daily.  . Calcium Carbonate-Vitamin D (CALCIUM 500 + D) 500-125 MG-UNIT TABS Take by mouth daily. Patient takes it only 3 days a week.   No current facility-administered  medications on file prior to visit.     Allergies:  No Known Allergies  Social History:  Social History   Social History  . Marital status: Married    Spouse name: N/A  . Number of children: N/A  . Years of education: N/A   Occupational History  . Not on file.   Social History Main Topics  . Smoking status: Former Smoker    Packs/day: 1.00    Years: 35.00    Types: Cigarettes    Quit date: 06/06/2004  . Smokeless tobacco: Never Used  . Alcohol use No  . Drug use: No  . Sexual activity: No   Other Topics Concern  . Not on file   Social History Narrative  . No narrative on file   History  Smoking Status  . Former Smoker  . Packs/day: 1.00  . Years: 35.00  . Types: Cigarettes  . Quit date: 06/06/2004  Smokeless Tobacco  . Never Used   History  Alcohol Use No    Family History:  Family History  Problem Relation Age of Onset  . Arthritis Mother   . Hyperlipidemia Mother   . Hypertension Mother   . Heart disease Father   . Hypertension Father   . Heart attack Father   . Cancer Sister        uterus  . Hypertension Sister   . Heart disease Brother   . Heart attack Brother   . Hypertension Brother   . Stroke  Maternal Grandmother   . Heart attack Brother   . Hypertension Brother   . Diabetes Neg Hx   . Breast cancer Neg Hx     Past medical history, surgical history, medications, allergies, family history and social history reviewed with patient today and changes made to appropriate areas of the chart.   Review of Systems  Constitutional: Negative.   HENT: Negative.   Eyes: Negative.   Respiratory: Negative.   Cardiovascular: Negative.   Gastrointestinal: Positive for constipation. Negative for abdominal pain, blood in stool, diarrhea, heartburn, melena, nausea and vomiting.  Genitourinary: Negative.   Musculoskeletal: Negative.   Skin: Negative.   Neurological: Negative.   Endo/Heme/Allergies: Negative for environmental allergies and polydipsia.  Bruises/bleeds easily.  Psychiatric/Behavioral: Negative.     All other ROS negative except what is listed above and in the HPI.      Objective:    BP 138/80 (BP Location: Left Arm, Cuff Size: Normal)   Pulse 70   Temp (!) 97.5 F (36.4 C)   Ht 5' 2.4" (1.585 m)   Wt 144 lb 9 oz (65.6 kg)   SpO2 99%   BMI 26.10 kg/m   Wt Readings from Last 3 Encounters:  03/07/17 144 lb 9 oz (65.6 kg)  08/30/16 145 lb (65.8 kg)  03/02/16 143 lb (64.9 kg)    Physical Exam  Constitutional: She is oriented to person, place, and time. She appears well-developed and well-nourished. No distress.  HENT:  Head: Normocephalic and atraumatic.  Right Ear: Hearing, tympanic membrane, external ear and ear canal normal.  Left Ear: Hearing, tympanic membrane, external ear and ear canal normal.  Nose: Nose normal.  Mouth/Throat: Uvula is midline, oropharynx is clear and moist and mucous membranes are normal. No oropharyngeal exudate.  Eyes: Pupils are equal, round, and reactive to light. Conjunctivae, EOM and lids are normal. Right eye exhibits no discharge. Left eye exhibits no discharge. No scleral icterus.  Neck: Normal range of motion. Neck supple. No JVD present. No tracheal deviation present. No thyromegaly present.  Cardiovascular: Normal rate, regular rhythm, normal heart sounds and intact distal pulses.  Exam reveals no gallop and no friction rub.   No murmur heard. Pulmonary/Chest: Effort normal and breath sounds normal. No stridor. No respiratory distress. She has no wheezes. She has no rales. She exhibits no tenderness.  Abdominal: Soft. Bowel sounds are normal. She exhibits no distension and no mass. There is no tenderness. There is no rebound and no guarding.  Genitourinary:  Genitourinary Comments: Breast and pelvic exam deferred with shared decision making  Musculoskeletal: Normal range of motion. She exhibits no edema, tenderness or deformity.  Lymphadenopathy:    She has no cervical  adenopathy.  Neurological: She is alert and oriented to person, place, and time. She has normal reflexes. She displays normal reflexes. No cranial nerve deficit. She exhibits normal muscle tone. Coordination normal.  Skin: Skin is warm, dry and intact. No rash noted. She is not diaphoretic. No erythema. No pallor.  Psychiatric: She has a normal mood and affect. Her speech is normal and behavior is normal. Judgment and thought content normal. Cognition and memory are normal.  Nursing note and vitals reviewed.   Results for orders placed or performed in visit on 08/30/16  Comprehensive metabolic panel  Result Value Ref Range   Glucose 106 (H) 65 - 99 mg/dL   BUN 10 8 - 27 mg/dL   Creatinine, Ser 1.09 (H) 0.57 - 1.00 mg/dL   GFR calc non  Af Amer 54 (L) >59 mL/min/1.73   GFR calc Af Amer 62 >59 mL/min/1.73   BUN/Creatinine Ratio 9 (L) 12 - 28   Sodium 144 134 - 144 mmol/L   Potassium 5.4 (H) 3.5 - 5.2 mmol/L   Chloride 104 96 - 106 mmol/L   CO2 23 18 - 29 mmol/L   Calcium 9.7 8.7 - 10.3 mg/dL   Total Protein 6.8 6.0 - 8.5 g/dL   Albumin 4.2 3.6 - 4.8 g/dL   Globulin, Total 2.6 1.5 - 4.5 g/dL   Albumin/Globulin Ratio 1.6 1.2 - 2.2   Bilirubin Total 0.3 0.0 - 1.2 mg/dL   Alkaline Phosphatase 80 39 - 117 IU/L   AST 21 0 - 40 IU/L   ALT 25 0 - 32 IU/L  Lipid Panel w/o Chol/HDL Ratio  Result Value Ref Range   Cholesterol, Total 171 100 - 199 mg/dL   Triglycerides 69 0 - 149 mg/dL   HDL 60 >39 mg/dL   VLDL Cholesterol Cal 14 5 - 40 mg/dL   LDL Calculated 97 0 - 99 mg/dL      Assessment & Plan:   Problem List Items Addressed This Visit      Genitourinary   Benign hypertensive renal disease    BP better on recheck. Continue current regimen. Continue to monitor. Call with any concerns.       Relevant Orders   Comprehensive metabolic panel   TSH   UA/M w/rflx Culture, Routine   Microalbumin, Urine Waived   CKD (chronic kidney disease) stage 3, GFR 30-59 ml/min (HCC)     Rechecking levels today. Await results. Call with any concerns.       Relevant Orders   CBC with Differential/Platelet   Comprehensive metabolic panel   UA/M w/rflx Culture, Routine     Other   Hyperlipidemia    Rechecking levels today. Continue current regimen. Continue to monitor. Call with any concerns.       Relevant Medications   atorvastatin (LIPITOR) 20 MG tablet   lisinopril (PRINIVIL,ZESTRIL) 10 MG tablet   Other Relevant Orders   Comprehensive metabolic panel   Lipid Panel w/o Chol/HDL Ratio   UA/M w/rflx Culture, Routine    Other Visit Diagnoses    Routine general medical examination at a health care facility    -  Primary   Vaccines up to date. Screening labs checked today. Pap up to date. Mammogram up to date. Declines colon. Continue diet and exercise. Call with any concerns.    Immunization due       Flu shot given today   Relevant Orders   Flu Vaccine QUAD 6+ mos PF IM (Fluarix Quad PF) (Completed)   Encounter for hepatitis C screening test for low risk patient       Labs drawn today. Await results.    Relevant Orders   Hepatitis C Antibody   Screening for HIV without presence of risk factors       Labs drawn today. Await results.    Relevant Orders   HIV antibody       Follow up plan: Return in about 6 months (around 09/05/2017) for Follow up BP.   LABORATORY TESTING:  - Pap smear: up to date  IMMUNIZATIONS:   - Tdap: Tetanus vaccination status reviewed: last tetanus booster within 10 years. - Influenza: Administered today - Pneumovax: Not applicable - Prevnar: Not applicable  SCREENING: -Mammogram: Up to date  - Colonoscopy: Refused  - Bone Density: Not applicable  PATIENT COUNSELING:   Advised to take 1 mg of folate supplement per day if capable of pregnancy.   Sexuality: Discussed sexually transmitted diseases, partner selection, use of condoms, avoidance of unintended pregnancy  and contraceptive alternatives.   Advised to avoid  cigarette smoking.  I discussed with the patient that most people either abstain from alcohol or drink within safe limits (<=14/week and <=4 drinks/occasion for males, <=7/weeks and <= 3 drinks/occasion for females) and that the risk for alcohol disorders and other health effects rises proportionally with the number of drinks per week and how often a drinker exceeds daily limits.  Discussed cessation/primary prevention of drug use and availability of treatment for abuse.   Diet: Encouraged to adjust caloric intake to maintain  or achieve ideal body weight, to reduce intake of dietary saturated fat and total fat, to limit sodium intake by avoiding high sodium foods and not adding table salt, and to maintain adequate dietary potassium and calcium preferably from fresh fruits, vegetables, and low-fat dairy products.    stressed the importance of regular exercise  Injury prevention: Discussed safety belts, safety helmets, smoke detector, smoking near bedding or upholstery.   Dental health: Discussed importance of regular tooth brushing, flossing, and dental visits.    NEXT PREVENTATIVE PHYSICAL DUE IN 1 YEAR. Return in about 6 months (around 09/05/2017) for Follow up BP.

## 2017-03-07 NOTE — Assessment & Plan Note (Signed)
Rechecking levels today. Continue current regimen. Continue to monitor. Call with any concerns.  

## 2017-03-07 NOTE — Assessment & Plan Note (Signed)
BP better on recheck. Continue current regimen. Continue to monitor. Call with any concerns.

## 2017-03-07 NOTE — Patient Instructions (Addendum)
Influenza (Flu) Vaccine (Inactivated or Recombinant): What You Need to Know 1. Why get vaccinated? Influenza ("flu") is a contagious disease that spreads around the [place] every year, usually between October and May. Flu is caused by influenza viruses, and is spread mainly by coughing, sneezing, and close contact. Anyone can get flu. Flu strikes suddenly and can last several days. Symptoms vary by age, but can include:  fever/chills  sore throat  muscle aches  fatigue  cough  headache  runny or stuffy nose  Flu can also lead to pneumonia and blood infections, and cause diarrhea and seizures in children. If you have a medical condition, such as heart or lung disease, flu can make it worse. Flu is more dangerous for some people. Infants and young children, people 23 years of age and older, pregnant women, and people with certain health conditions or a weakened immune system are at greatest risk. Each year thousands of people in the [place] die from flu, and many more are hospitalized. Flu vaccine can:  keep you from getting flu,  make flu less severe if you do get it, and  keep you from spreading flu to your family and other people. 2. Inactivated and recombinant flu vaccines A dose of flu vaccine is recommended every flu season. Children 6 months through 91 years of age may need two doses during the same flu season. Everyone else needs only one dose each flu season. Some inactivated flu vaccines contain a very small amount of a mercury-based preservative called thimerosal. Studies have not shown thimerosal in vaccines to be harmful, but flu vaccines that do not contain thimerosal are available. There is no live flu virus in flu shots. They cannot cause the flu. There are many flu viruses, and they are always changing. Each year a new flu vaccine is made to protect against three or four viruses that are likely to cause disease in the upcoming flu season. But even when the  vaccine doesn't exactly match these viruses, it may still provide some protection. Flu vaccine cannot prevent:  flu that is caused by a virus not covered by the vaccine, or  illnesses that look like flu but are not.  It takes about 2 weeks for protection to develop after vaccination, and protection lasts through the flu season. 3. Some people should not get this vaccine Tell the person who is giving you the vaccine:  If you have any severe, life-threatening allergies. If you ever had a life-threatening allergic reaction after a dose of flu vaccine, or have a severe allergy to any part of this vaccine, you may be advised not to get vaccinated. Most, but not all, types of flu vaccine contain a small amount of egg protein.  If you ever had Guillain-Barr Syndrome (also called GBS). Some people with a history of GBS should not get this vaccine. This should be discussed with your doctor.  If you are not feeling well. It is usually okay to get flu vaccine when you have a mild illness, but you might be asked to come back when you feel better.  4. Risks of a vaccine reaction With any medicine, including vaccines, there is a chance of reactions. These are usually mild and go away on their own, but serious reactions are also possible. Most people who get a flu shot do not have any problems with it. Minor problems following a flu shot include:  soreness, redness, or swelling where the shot was given  hoarseness  sore,  red or itchy eyes  cough  fever  aches  headache  itching  fatigue  If these problems occur, they usually begin soon after the shot and last 1 or 2 days. More serious problems following a flu shot can include the following:  There may be a small increased risk of Guillain-Barre Syndrome (GBS) after inactivated flu vaccine. This risk has been estimated at 1 or 2 additional cases per million people vaccinated. This is much lower than the risk of severe complications from  flu, which can be prevented by flu vaccine.  Young children who get the flu shot along with pneumococcal vaccine (PCV13) and/or DTaP vaccine at the same time might be slightly more likely to have a seizure caused by fever. Ask your doctor for more information. Tell your doctor if a child who is getting flu vaccine has ever had a seizure.  Problems that could happen after any injected vaccine:  People sometimes faint after a medical procedure, including vaccination. Sitting or lying down for about 15 minutes can help prevent fainting, and injuries caused by a fall. Tell your doctor if you feel dizzy, or have vision changes or ringing in the ears.  Some people get severe pain in the shoulder and have difficulty moving the arm where a shot was given. This happens very rarely.  Any medication can cause a severe allergic reaction. Such reactions from a vaccine are very rare, estimated at about 1 in a million doses, and would happen within a few minutes to a few hours after the vaccination. As with any medicine, there is a very remote chance of a vaccine causing a serious injury or death. The safety of vaccines is always being monitored. For more information, visit: http://www.aguilar.org/ 5. What if there is a serious reaction? What should I look for? Look for anything that concerns you, such as signs of a severe allergic reaction, very high fever, or unusual behavior. Signs of a severe allergic reaction can include hives, swelling of the face and throat, difficulty breathing, a fast heartbeat, dizziness, and weakness. These would start a few minutes to a few hours after the vaccination. What should I do?  If you think it is a severe allergic reaction or other emergency that can't wait, call 9-1-1 and get the person to the nearest hospital. Otherwise, call your doctor.  Reactions should be reported to the Vaccine Adverse Event Reporting System (VAERS). Your doctor should file this report, or you  can do it yourself through the VAERS web site at www.vaers.SamedayNews.es, or by calling 6094730752. ? VAERS does not give medical advice. 6. The National Vaccine Injury Compensation Program The [employer] Vaccine Injury Compensation Program (VICP) is a federal program that was created to compensate people who may have been injured by certain vaccines. Persons who believe they may have been injured by a vaccine can learn about the program and about filing a claim by calling 458-267-6070 or visiting the [place] website at GoldCloset.com.ee. There is a time limit to file a claim for compensation. 7. How can I learn more?  Ask your healthcare provider. He or she can give you the vaccine package insert or suggest other sources of information.  Call your local or state health department.  Contact the Centers for Disease Control and Prevention (CDC): ? Call (540)164-9661 (1-800-CDC-INFO) or ? Visit CDC's website at https://gibson.com/ Vaccine Information Statement, Inactivated Influenza Vaccine (01/10/2014) This information is not intended to replace advice given to you by your health care provider. Make sure  you discuss any questions you have with your health care provider. Document Released: 03/17/2006 Document Revised: 02/11/2016 Document Reviewed: 02/11/2016 Elsevier Interactive Patient Education  2017 [place] Maintenance, Female Adopting a healthy lifestyle and getting preventive care can go a long way to promote health and wellness. Talk with your health care provider about what schedule of regular examinations is right for you. This is a good chance for you to check in with your provider about disease prevention and staying healthy. In between checkups, there are plenty of things you can do on your own. Experts have done a lot of research about which lifestyle changes and preventive measures are most likely to keep you healthy. Ask your health care provider for more  information. Weight and diet Eat a healthy diet  Be sure to include plenty of vegetables, fruits, low-fat dairy products, and lean protein.  Do not eat a lot of foods high in solid fats, added sugars, or salt.  Get regular exercise. This is one of the most important things you can do for your health. ? Most adults should exercise for at least 150 minutes each week. The exercise should increase your heart rate and make you sweat (moderate-intensity exercise). ? Most adults should also do strengthening exercises at least twice a week. This is in addition to the moderate-intensity exercise.  Maintain a healthy weight  Body mass index (BMI) is a measurement that can be used to identify possible weight problems. It estimates body fat based on height and weight. Your health care provider can help determine your BMI and help you achieve or maintain a healthy weight.  For females 8 years of age and older: ? A BMI below 18.5 is considered underweight. ? A BMI of 18.5 to 24.9 is normal. ? A BMI of 25 to 29.9 is considered overweight. ? A BMI of 30 and above is considered obese.  Watch levels of cholesterol and blood lipids  You should start having your blood tested for lipids and cholesterol at 65 years of age, then have this test every 5 years.  You may need to have your cholesterol levels checked more often if: ? Your lipid or cholesterol levels are high. ? You are older than 65 years of age. ? You are at high risk for heart disease.  Cancer screening Lung Cancer  Lung cancer screening is recommended for adults 23-50 years old who are at high risk for lung cancer because of a history of smoking.  A yearly low-dose CT scan of the lungs is recommended for people who: ? Currently smoke. ? Have quit within the past 15 years. ? Have at least a 30-pack-year history of smoking. A pack year is smoking an average of one pack of cigarettes a day for 1 year.  Yearly screening should continue  until it has been 15 years since you quit.  Yearly screening should stop if you develop a health problem that would prevent you from having lung cancer treatment.  Breast Cancer  Practice breast self-awareness. This means understanding how your breasts normally appear and feel.  It also means doing regular breast self-exams. Let your health care provider know about any changes, no matter how small.  If you are in your 20s or 30s, you should have a clinical breast exam (CBE) by a health care provider every 1-3 years as part of a regular health exam.  If you are 31 or older, have a CBE every year. Also consider having a breast X-ray (  mammogram) every year.  If you have a family history of breast cancer, talk to your health care provider about genetic screening.  If you are at high risk for breast cancer, talk to your health care provider about having an MRI and a mammogram every year.  Breast cancer gene (BRCA) assessment is recommended for women who have family members with BRCA-related cancers. BRCA-related cancers include: ? Breast. ? Ovarian. ? Tubal. ? Peritoneal cancers.  Results of the assessment will determine the need for genetic counseling and BRCA1 and BRCA2 testing.  Cervical Cancer Your health care provider may recommend that you be screened regularly for cancer of the pelvic organs (ovaries, uterus, and vagina). This screening involves a pelvic examination, including checking for microscopic changes to the surface of your cervix (Pap test). You may be encouraged to have this screening done every 3 years, beginning at age 71.  For women ages 31-65, health care providers may recommend pelvic exams and Pap testing every 3 years, or they may recommend the Pap and pelvic exam, combined with testing for human papilloma virus (HPV), every 5 years. Some types of HPV increase your risk of cervical cancer. Testing for HPV may also be done on women of any age with unclear Pap test  results.  Other health care providers may not recommend any screening for nonpregnant women who are considered low risk for pelvic cancer and who do not have symptoms. Ask your health care provider if a screening pelvic exam is right for you.  If you have had past treatment for cervical cancer or a condition that could lead to cancer, you need Pap tests and screening for cancer for at least 20 years after your treatment. If Pap tests have been discontinued, your risk factors (such as having a new sexual partner) need to be reassessed to determine if screening should resume. Some women have medical problems that increase the chance of getting cervical cancer. In these cases, your health care provider may recommend more frequent screening and Pap tests.  Colorectal Cancer  This type of cancer can be detected and often prevented.  Routine colorectal cancer screening usually begins at 65 years of age and continues through 65 years of age.  Your health care provider may recommend screening at an earlier age if you have risk factors for colon cancer.  Your health care provider may also recommend using home test kits to check for hidden blood in the stool.  A small camera at the end of a tube can be used to examine your colon directly (sigmoidoscopy or colonoscopy). This is done to check for the earliest forms of colorectal cancer.  Routine screening usually begins at age 8.  Direct examination of the colon should be repeated every 5-10 years through 65 years of age. However, you may need to be screened more often if early forms of precancerous polyps or small growths are found.  Skin Cancer  Check your skin from head to toe regularly.  Tell your health care provider about any new moles or changes in moles, especially if there is a change in a mole's shape or color.  Also tell your health care provider if you have a mole that is larger than the size of a pencil eraser.  Always use sunscreen.  Apply sunscreen liberally and repeatedly throughout the day.  Protect yourself by wearing long sleeves, pants, a wide-brimmed hat, and sunglasses whenever you are outside.  Heart disease, diabetes, and high blood pressure  High blood pressure  causes heart disease and increases the risk of stroke. High blood pressure is more likely to develop in: ? People who have blood pressure in the high end of the normal range (130-139/85-89 mm Hg). ? People who are overweight or obese. ? People who are African American.  If you are 87-72 years of age, have your blood pressure checked every 3-5 years. If you are 45 years of age or older, have your blood pressure checked every year. You should have your blood pressure measured twice-once when you are at a hospital or clinic, and once when you are not at a hospital or clinic. Record the average of the two measurements. To check your blood pressure when you are not at a hospital or clinic, you can use: ? An automated blood pressure machine at a pharmacy. ? A home blood pressure monitor.  If you are between 48 years and 37 years old, ask your health care provider if you should take aspirin to prevent strokes.  Have regular diabetes screenings. This involves taking a blood sample to check your fasting blood sugar level. ? If you are at a normal weight and have a low risk for diabetes, have this test once every three years after 65 years of age. ? If you are overweight and have a high risk for diabetes, consider being tested at a younger age or more often. Preventing infection Hepatitis B  If you have a higher risk for hepatitis B, you should be screened for this virus. You are considered at high risk for hepatitis B if: ? You were born in a country where hepatitis B is common. Ask your health care provider which countries are considered high risk. ? Your parents were born in a high-risk country, and you have not been immunized against hepatitis B (hepatitis B  vaccine). ? You have HIV or AIDS. ? You use needles to inject street drugs. ? You live with someone who has hepatitis B. ? You have had sex with someone who has hepatitis B. ? You get hemodialysis treatment. ? You take certain medicines for conditions, including cancer, organ transplantation, and autoimmune conditions.  Hepatitis C  Blood testing is recommended for: ? Everyone born from [birth year] through 1964. ? Anyone with known risk factors for hepatitis C.  Sexually transmitted infections (STIs)  You should be screened for sexually transmitted infections (STIs) including gonorrhea and chlamydia if: ? You are sexually active and are younger than 65 years of age. ? You are older than 65 years of age and your health care provider tells you that you are at risk for this type of infection. ? Your sexual activity has changed since you were last screened and you are at an increased risk for chlamydia or gonorrhea. Ask your health care provider if you are at risk.  If you do not have HIV, but are at risk, it may be recommended that you take a prescription medicine daily to prevent HIV infection. This is called pre-exposure prophylaxis (PrEP). You are considered at risk if: ? You are sexually active and do not regularly use condoms or know the HIV status of your partner(s). ? You take drugs by injection. ? You are sexually active with a partner who has HIV.  Talk with your health care provider about whether you are at high risk of being infected with HIV. If you choose to begin PrEP, you should first be tested for HIV. You should then be tested every 3 months for as long as  you are taking PrEP. Pregnancy  If you are premenopausal and you may become pregnant, ask your health care provider about preconception counseling.  If you may become pregnant, take 400 to 800 micrograms (mcg) of folic acid every day.  If you want to prevent pregnancy, talk to your health care provider about birth control  (contraception). Osteoporosis and menopause  Osteoporosis is a disease in which the bones lose minerals and strength with aging. This can result in serious bone fractures. Your risk for osteoporosis can be identified using a bone density scan.  If you are 65 years of age or older, or if you are at risk for osteoporosis and fractures, ask your health care provider if you should be screened.  Ask your health care provider whether you should take a calcium or vitamin D supplement to lower your risk for osteoporosis.  Menopause may have certain physical symptoms and risks.  Hormone replacement therapy may reduce some of these symptoms and risks. Talk to your health care provider about whether hormone replacement therapy is right for you. Follow these instructions at home:  Schedule regular health, dental, and eye exams.  Stay current with your immunizations.  Do not use any tobacco products including cigarettes, chewing tobacco, or electronic cigarettes.  If you are pregnant, do not drink alcohol.  If you are breastfeeding, limit how much and how often you drink alcohol.  Limit alcohol intake to no more than 1 drink per day for nonpregnant women. One drink equals 12 ounces of beer, 5 ounces of wine, or 1 ounces of hard liquor.  Do not use street drugs.  Do not share needles.  Ask your health care provider for help if you need support or information about quitting drugs.  Tell your health care provider if you often feel depressed.  Tell your health care provider if you have ever been abused or do not feel safe at home. This information is not intended to replace advice given to you by your health care provider. Make sure you discuss any questions you have with your health care provider. Document Released: 12/06/2010 Document Revised: 10/29/2015 Document Reviewed: 02/24/2015 Elsevier Interactive Patient Education  2018 [employer] Inc.  Health Maintenance for Postmenopausal  Women Menopause is a normal process in which your reproductive ability comes to an end. This process happens gradually over a span of months to years, usually between the ages of 48 and 55. Menopause is complete when you have missed 12 consecutive menstrual periods. It is important to talk with your health care provider about some of the most common conditions that affect postmenopausal women, such as heart disease, cancer, and bone loss (osteoporosis). Adopting a healthy lifestyle and getting preventive care can help to promote your health and wellness. Those actions can also lower your chances of developing some of these common conditions. What should I know about menopause? During menopause, you may experience a number of symptoms, such as:  Moderate-to-severe hot flashes.  Night sweats.  Decrease in sex drive.  Mood swings.  Headaches.  Tiredness.  Irritability.  Memory problems.  Insomnia.  Choosing to treat or not to treat menopausal changes is an individual decision that you make with your health care provider. What should I know about hormone replacement therapy and supplements? Hormone therapy products are effective for treating symptoms that are associated with menopause, such as hot flashes and night sweats. Hormone replacement carries certain risks, especially as you become older. If you are thinking about using estrogen or estrogen   with progestin treatments, discuss the benefits and risks with your health care provider. What should I know about heart disease and stroke? Heart disease, heart attack, and stroke become more likely as you age. This may be due, in part, to the hormonal changes that your body experiences during menopause. These can affect how your body processes dietary fats, triglycerides, and cholesterol. Heart attack and stroke are both medical emergencies. There are many things that you can do to help prevent heart disease and stroke:  Have your blood  pressure checked at least every 1-2 years. High blood pressure causes heart disease and increases the risk of stroke.  If you are 55-79 years old, ask your health care provider if you should take aspirin to prevent a heart attack or a stroke.  Do not use any tobacco products, including cigarettes, chewing tobacco, or electronic cigarettes. If you need help quitting, ask your health care provider.  It is important to eat a healthy diet and maintain a healthy weight. ? Be sure to include plenty of vegetables, fruits, low-fat dairy products, and lean protein. ? Avoid eating foods that are high in solid fats, added sugars, or salt (sodium).  Get regular exercise. This is one of the most important things that you can do for your health. ? Try to exercise for at least 150 minutes each week. The type of exercise that you do should increase your heart rate and make you sweat. This is known as moderate-intensity exercise. ? Try to do strengthening exercises at least twice each week. Do these in addition to the moderate-intensity exercise.  Know your numbers.Ask your health care provider to check your cholesterol and your blood glucose. Continue to have your blood tested as directed by your health care provider.  What should I know about cancer screening? There are several types of cancer. Take the following steps to reduce your risk and to catch any cancer development as early as possible. Breast Cancer  Practice breast self-awareness. ? This means understanding how your breasts normally appear and feel. ? It also means doing regular breast self-exams. Let your health care provider know about any changes, no matter how small.  If you are 40 or older, have a clinician do a breast exam (clinical breast exam or CBE) every year. Depending on your age, family history, and medical history, it may be recommended that you also have a yearly breast X-ray (mammogram).  If you have a family history of breast  cancer, talk with your health care provider about genetic screening.  If you are at high risk for breast cancer, talk with your health care provider about having an MRI and a mammogram every year.  Breast cancer (BRCA) gene test is recommended for women who have family members with BRCA-related cancers. Results of the assessment will determine the need for genetic counseling and BRCA1 and for BRCA2 testing. BRCA-related cancers include these types: ? Breast. This occurs in males or females. ? Ovarian. ? Tubal. This may also be called fallopian tube cancer. ? Cancer of the abdominal or pelvic lining (peritoneal cancer). ? Prostate. ? Pancreatic.  Cervical, Uterine, and Ovarian Cancer Your health care provider may recommend that you be screened regularly for cancer of the pelvic organs. These include your ovaries, uterus, and vagina. This screening involves a pelvic exam, which includes checking for microscopic changes to the surface of your cervix (Pap test).  For women ages 21-65, health care providers may recommend a pelvic exam and a Pap test   every three years. For women ages 30-65, they may recommend the Pap test and pelvic exam, combined with testing for human papilloma virus (HPV), every five years. Some types of HPV increase your risk of cervical cancer. Testing for HPV may also be done on women of any age who have unclear Pap test results.  Other health care providers may not recommend any screening for nonpregnant women who are considered low risk for pelvic cancer and have no symptoms. Ask your health care provider if a screening pelvic exam is right for you.  If you have had past treatment for cervical cancer or a condition that could lead to cancer, you need Pap tests and screening for cancer for at least 20 years after your treatment. If Pap tests have been discontinued for you, your risk factors (such as having a new sexual partner) need to be reassessed to determine if you should  start having screenings again. Some women have medical problems that increase the chance of getting cervical cancer. In these cases, your health care provider may recommend that you have screening and Pap tests more often.  If you have a family history of uterine cancer or ovarian cancer, talk with your health care provider about genetic screening.  If you have vaginal bleeding after reaching menopause, tell your health care provider.  There are currently no reliable tests available to screen for ovarian cancer.  Lung Cancer Lung cancer screening is recommended for adults 55-80 years old who are at high risk for lung cancer because of a history of smoking. A yearly low-dose CT scan of the lungs is recommended if you:  Currently smoke.  Have a history of at least 30 pack-years of smoking and you currently smoke or have quit within the past 15 years. A pack-year is smoking an average of one pack of cigarettes per day for one year.  Yearly screening should:  Continue until it has been 15 years since you quit.  Stop if you develop a health problem that would prevent you from having lung cancer treatment.  Colorectal Cancer  This type of cancer can be detected and can often be prevented.  Routine colorectal cancer screening usually begins at age 50 and continues through age 75.  If you have risk factors for colon cancer, your health care provider may recommend that you be screened at an earlier age.  If you have a family history of colorectal cancer, talk with your health care provider about genetic screening.  Your health care provider may also recommend using home test kits to check for hidden blood in your stool.  A small camera at the end of a tube can be used to examine your colon directly (sigmoidoscopy or colonoscopy). This is done to check for the earliest forms of colorectal cancer.  Direct examination of the colon should be repeated every 5-10 years until age 75. However, if  early forms of precancerous polyps or small growths are found or if you have a family history or genetic risk for colorectal cancer, you may need to be screened more often.  Skin Cancer  Check your skin from head to toe regularly.  Monitor any moles. Be sure to tell your health care provider: ? About any new moles or changes in moles, especially if there is a change in a mole's shape or color. ? If you have a mole that is larger than the size of a pencil eraser.  If any of your family members has a history of   skin cancer, especially at a young age, talk with your health care provider about genetic screening.  Always use sunscreen. Apply sunscreen liberally and repeatedly throughout the day.  Whenever you are outside, protect yourself by wearing long sleeves, pants, a wide-brimmed hat, and sunglasses.  What should I know about osteoporosis? Osteoporosis is a condition in which bone destruction happens more quickly than new bone creation. After menopause, you may be at an increased risk for osteoporosis. To help prevent osteoporosis or the bone fractures that can happen because of osteoporosis, the following is recommended:  If you are 19-50 years old, get at least 1,000 mg of calcium and at least 600 mg of vitamin D per day.  If you are older than age 50 but younger than age 70, get at least 1,200 mg of calcium and at least 600 mg of vitamin D per day.  If you are older than age 70, get at least 1,200 mg of calcium and at least 800 mg of vitamin D per day.  Smoking and excessive alcohol intake increase the risk of osteoporosis. Eat foods that are rich in calcium and vitamin D, and do weight-bearing exercises several times each week as directed by your health care provider. What should I know about how menopause affects my mental health? Depression may occur at any age, but it is more common as you become older. Common symptoms of depression include:  Low or sad mood.  Changes in sleep  patterns.  Changes in appetite or eating patterns.  Feeling an overall lack of motivation or enjoyment of activities that you previously enjoyed.  Frequent crying spells.  Talk with your health care provider if you think that you are experiencing depression. What should I know about immunizations? It is important that you get and maintain your immunizations. These include:  Tetanus, diphtheria, and pertussis (Tdap) booster vaccine.  Influenza every year before the flu season begins.  Pneumonia vaccine.  Shingles vaccine.  Your health care provider may also recommend other immunizations. This information is not intended to replace advice given to you by your health care provider. Make sure you discuss any questions you have with your health care provider. Document Released: 07/15/2005 Document Revised: 12/11/2015 Document Reviewed: 02/24/2015 Elsevier Interactive Patient Education  2018 Elsevier Inc.  

## 2017-03-07 NOTE — Assessment & Plan Note (Signed)
Rechecking levels today. Await results. Call with any concerns.  

## 2017-03-08 LAB — COMPREHENSIVE METABOLIC PANEL
A/G RATIO: 1.8 (ref 1.2–2.2)
ALK PHOS: 83 IU/L (ref 39–117)
ALT: 23 IU/L (ref 0–32)
AST: 25 IU/L (ref 0–40)
Albumin: 4.3 g/dL (ref 3.6–4.8)
BUN/Creatinine Ratio: 28 (ref 12–28)
BUN: 21 mg/dL (ref 8–27)
Bilirubin Total: 0.4 mg/dL (ref 0.0–1.2)
CALCIUM: 9.5 mg/dL (ref 8.7–10.3)
CO2: 25 mmol/L (ref 20–29)
Chloride: 105 mmol/L (ref 96–106)
Creatinine, Ser: 0.75 mg/dL (ref 0.57–1.00)
GFR calc Af Amer: 97 mL/min/{1.73_m2} (ref >59)
GFR, EST NON AFRICAN AMERICAN: 85 mL/min/{1.73_m2} (ref >59)
Globulin, Total: 2.4 g/dL (ref 1.5–4.5)
Glucose: 83 mg/dL (ref 65–99)
POTASSIUM: 4.7 mmol/L (ref 3.5–5.2)
Sodium: 143 mmol/L (ref 134–144)
Total Protein: 6.7 g/dL (ref 6.0–8.5)

## 2017-03-08 LAB — CBC WITH DIFFERENTIAL/PLATELET
BASOS: 1 %
Basophils Absolute: 0 10*3/uL (ref 0.0–0.2)
EOS (ABSOLUTE): 0.3 10*3/uL (ref 0.0–0.4)
Eos: 5 %
HEMATOCRIT: 39.7 % (ref 34.0–46.6)
HEMOGLOBIN: 13.3 g/dL (ref 11.1–15.9)
Immature Grans (Abs): 0 10*3/uL (ref 0.0–0.1)
Immature Granulocytes: 0 %
Lymphocytes Absolute: 2.2 10*3/uL (ref 0.7–3.1)
Lymphs: 36 %
MCH: 28.6 pg (ref 26.6–33.0)
MCHC: 33.5 g/dL (ref 31.5–35.7)
MCV: 85 fL (ref 79–97)
MONOS ABS: 0.7 10*3/uL (ref 0.1–0.9)
Monocytes: 11 %
NEUTROS ABS: 2.9 10*3/uL (ref 1.4–7.0)
NEUTROS PCT: 47 %
Platelets: 257 10*3/uL (ref 150–379)
RBC: 4.65 x10E6/uL (ref 3.77–5.28)
RDW: 14.6 % (ref 12.3–15.4)
WBC: 6.1 10*3/uL (ref 3.4–10.8)

## 2017-03-08 LAB — TSH: TSH: 3.2 u[IU]/mL (ref 0.450–4.500)

## 2017-03-08 LAB — LIPID PANEL W/O CHOL/HDL RATIO
CHOLESTEROL TOTAL: 161 mg/dL (ref 100–199)
HDL: 57 mg/dL (ref >39)
LDL Calculated: 94 mg/dL (ref 0–99)
TRIGLYCERIDES: 50 mg/dL (ref 0–149)
VLDL Cholesterol Cal: 10 mg/dL (ref 5–40)

## 2017-03-08 LAB — HIV ANTIBODY (ROUTINE TESTING W REFLEX): HIV Screen 4th Generation wRfx: NONREACTIVE

## 2017-03-08 LAB — HEPATITIS C ANTIBODY: Hep C Virus Ab: <0.1 s/co ratio (ref 0.0–0.9)

## 2017-03-09 ENCOUNTER — Encounter: Payer: Self-pay | Admitting: Family Medicine

## 2017-04-18 ENCOUNTER — Telehealth: Payer: Self-pay | Admitting: Family Medicine

## 2017-04-18 NOTE — Telephone Encounter (Signed)
I don't know how to change her name in the medical record. If we get this changed I will send her medicine back over.

## 2017-04-18 NOTE — Telephone Encounter (Signed)
Jasmine Church: Is this something that you can fix?

## 2017-04-18 NOTE — Telephone Encounter (Signed)
Copied from [place] [phone]. Topic: General - Other >> Apr 18, 2017  3:47 PM Neva Seat wrote: Pt needs the name as Jasmine Church. Jasmine Church for prescriptions.  It was sent under Jasmine Church.  Her legal name with her insurance is Jasmine Church. Jasmine Church. Pharmacy will not fill under the name Jasmine Church.  Needs the recent 2 prescriptions refilled  [phone] - [facility]

## 2017-04-21 NOTE — Telephone Encounter (Signed)
Jasmine Church, I can write out her Rxs for her if she needs them before this can get sorted

## 2017-04-24 MED ORDER — ATORVASTATIN CALCIUM 20 MG PO TABS: ORAL_TABLET | ORAL | 1 refills | Status: DC

## 2017-04-24 MED ORDER — LISINOPRIL 10 MG PO TABS: ORAL_TABLET | ORAL | 1 refills | Status: DC

## 2017-04-24 NOTE — Telephone Encounter (Signed)
Spoke with pt and I have changed the legal name to Jasmine Church. She would like to have medication sent to [facility].

## 2017-04-25 ENCOUNTER — Telehealth: Payer: Self-pay | Admitting: Family Medicine

## 2017-04-25 NOTE — Telephone Encounter (Signed)
Pt has had issues with [facility] with her legal name for Rx refill.  They will not be able to send her Lisinopril 10 mg - Nov. 29th.  Pt will be out of medication before Nov. 29th.  Needs enough of Rx to last her until [facility] send her the prescription in the mail.  BP medicine - [facility]ril [facility]   [facility] [place]

## 2017-04-25 NOTE — Telephone Encounter (Signed)
Copied from [place] [phone]. Topic: General >> Apr 25, 2017  3:42 PM Neva Seat wrote:  Pt has had issues with [facility] with her legal name for Rx refill.  They will not be able to send her Lisinopril 10 mg - Nov. 29th.  Pt will be out of medication before Nov. 29th.  Needs enough of Rx to last her until [facility] send her the prescription in the mail.  BP medicine - [facility] [place]

## 2017-04-26 MED ORDER — LISINOPRIL 10 MG PO TABS: ORAL_TABLET | ORAL | 1 refills | Status: DC

## 2017-04-26 NOTE — Telephone Encounter (Signed)
Rx sent to [facility]

## 2017-09-05 ENCOUNTER — Ambulatory Visit (INDEPENDENT_AMBULATORY_CARE_PROVIDER_SITE_OTHER): Payer: Medicare Other | Admitting: Family Medicine

## 2017-09-05 ENCOUNTER — Encounter: Payer: Self-pay | Admitting: Family Medicine

## 2017-09-05 VITALS — BP 122/83 | HR 66 | Temp 97.6°F | Ht 62.4 in | Wt 149.2 lb

## 2017-09-05 DIAGNOSIS — I129 Hypertensive chronic kidney disease with stage 1 through stage 4 chronic kidney disease, or unspecified chronic kidney disease: Secondary | ICD-10-CM | POA: Diagnosis not present

## 2017-09-05 DIAGNOSIS — K219 Gastro-esophageal reflux disease without esophagitis: Secondary | ICD-10-CM | POA: Diagnosis not present

## 2017-09-05 DIAGNOSIS — R252 Cramp and spasm: Secondary | ICD-10-CM

## 2017-09-05 DIAGNOSIS — L900 Lichen sclerosus et atrophicus: Secondary | ICD-10-CM | POA: Diagnosis not present

## 2017-09-05 DIAGNOSIS — N393 Stress incontinence (female) (male): Secondary | ICD-10-CM | POA: Diagnosis not present

## 2017-09-05 DIAGNOSIS — E782 Mixed hyperlipidemia: Secondary | ICD-10-CM | POA: Diagnosis not present

## 2017-09-05 LAB — UA/M W/RFLX CULTURE, ROUTINE
BILIRUBIN UA: NEGATIVE
GLUCOSE, UA: NEGATIVE
KETONES UA: NEGATIVE
Leukocytes, UA: NEGATIVE
NITRITE UA: NEGATIVE
Protein, UA: NEGATIVE
Specific Gravity, UA: 1.02 (ref 1.005–1.030)
UUROB: 0.2 mg/dL (ref 0.2–1.0)
pH, UA: 5 (ref 5.0–7.5)

## 2017-09-05 LAB — MICROSCOPIC EXAMINATION
Bacteria, UA: NONE SEEN
WBC UA: NONE SEEN /HPF (ref 0–5)

## 2017-09-05 MED ORDER — CLOBETASOL PROPIONATE 0.05 % EX CREA
1.0000 | TOPICAL_CREAM | Freq: Two times a day (BID) | CUTANEOUS | 1 refills | Status: DC
Start: 2017-09-05 — End: 2017-09-08

## 2017-09-05 MED ORDER — LISINOPRIL 10 MG PO TABS: ORAL_TABLET | ORAL | 1 refills | Status: DC

## 2017-09-05 MED ORDER — ATORVASTATIN CALCIUM 20 MG PO TABS: ORAL_TABLET | ORAL | 1 refills | Status: DC

## 2017-09-05 NOTE — Patient Instructions (Signed)
Lichen Sclerosus Lichen sclerosus is a skin problem. It can happen on any part of the body. It happens most often in the anal or genital areas. It can cause itching and discomfort. Treatment can help to control symptoms. This skin problem is not passed from one person to another (not contagious). The cause is not known. Follow these instructions at home:  Take over-the-counter and prescription medicines only as told by your doctor.  Use creams or ointments as told by your doctor.  Do not scratch the affected areas of skin.  Women should keep the vagina as clean and dry as they can.  Keep all follow-up visits as told by your doctor. This is important. Contact a doctor if:  Your redness, swelling, or pain gets worse.  You have fluid, blood, or pus coming from the area.  You have new patches (lesions) on your skin.  You have a fever.  You have pain during sex. This information is not intended to replace advice given to you by your health care provider. Make sure you discuss any questions you have with your health care provider. Document Released: 05/05/2008 Document Revised: 10/29/2015 Document Reviewed: 08/18/2014 Elsevier Interactive Patient Education  2018 Elsevier Inc.  

## 2017-09-05 NOTE — Assessment & Plan Note (Signed)
Will start clobetasol. Recheck 1 month. Call with any concerns.

## 2017-09-05 NOTE — Assessment & Plan Note (Signed)
Encouraged adequate hydration. Checking CMP. Await results. Call with any concerns.

## 2017-09-05 NOTE — Progress Notes (Signed)
BP 122/83 (BP Location: Left Arm, Patient Position: Sitting, Cuff Size: Normal)   Pulse 66   Temp 97.6 F (36.4 C)   Ht 5' 2.4" (1.585 m)   Wt 149 lb 3 oz (67.7 kg)   SpO2 99%   BMI 26.94 kg/m    Subjective:    Patient ID: Jasmine Church, female    DOB: 1951-02-17, 67 y.o.   MRN: [id number]  HPI: Jasmine Church is a 67 y.o. female  Chief Complaint  Patient presents with  . Hypertension   HYPERTENSION / [place] Satisfied with current treatment? yes Duration of hypertension: chronic BP monitoring frequency: not checking BP medication side effects: no Past BP meds: lisinopril Duration of hyperlipidemia: chronic Cholesterol medication side effects: no Cholesterol supplements: none Past cholesterol medications: atorvastatin Medication compliance: excellent compliance Aspirin: yes Recent stressors: no Recurrent headaches: no Visual changes: no Palpitations: no Dyspnea: no Chest pain: no Lower extremity edema: no Dizzy/lightheaded: no   Has been having some leg cramps- did some extra stretches and she did better. Has had a history of hyperkalemia in the past, has been watching her diet.   Has been having vaginal itching from her labia to her anus since November. Has not noticed any changes in her skin. The heat in the shower makes it way worse. Petroleum jelly helped a little bit. Heat makes it worse. No bleeding.   Has been having worsening stress incontinence. Has been having problems when she rolls over in bed or cough. States that she can't isolate her pelvic floor- so hasn't doesn't done kegals.   GERD GERD control status: uncontrolled  Satisfied with current treatment? no Heartburn frequency: when laying down at night Medication side effects: Not on anything  Dysphagia: no Odynophagia:  no Hematemesis: no Blood in stool: no EGD: no  Has been having some constipation.  Relevant past medical, surgical, family and social history reviewed and updated as  indicated. Interim medical history since our last visit reviewed. Allergies and medications reviewed and updated.  Review of Systems  Constitutional: Negative.   Respiratory: Negative.   Cardiovascular: Negative.   Gastrointestinal: Negative.   Genitourinary: Positive for urgency. Negative for decreased urine volume, difficulty urinating, dyspareunia, dysuria, enuresis, flank pain, frequency, genital sores, hematuria, menstrual problem, pelvic pain, vaginal bleeding, vaginal discharge and vaginal pain.  Musculoskeletal: Negative.   Skin: Negative.   Psychiatric/Behavioral: Negative.     Per HPI unless specifically indicated above     Objective:    BP 122/83 (BP Location: Left Arm, Patient Position: Sitting, Cuff Size: Normal)   Pulse 66   Temp 97.6 F (36.4 C)   Ht 5' 2.4" (1.585 m)   Wt 149 lb 3 oz (67.7 kg)   SpO2 99%   BMI 26.94 kg/m   Wt Readings from Last 3 Encounters:  09/05/17 149 lb 3 oz (67.7 kg)  03/07/17 144 lb 9 oz (65.6 kg)  08/30/16 145 lb (65.8 kg)    Physical Exam  Constitutional: She is oriented to person, place, and time. She appears well-developed and well-nourished. No distress.  HENT:  Head: Normocephalic and atraumatic.  Right Ear: Hearing normal.  Left Ear: Hearing normal.  Nose: Nose normal.  Eyes: Conjunctivae and lids are normal. Right eye exhibits no discharge. Left eye exhibits no discharge. No scleral icterus.  Cardiovascular: Normal rate, regular rhythm, normal heart sounds and intact distal pulses. Exam reveals no gallop and no friction rub.  No murmur heard. Pulmonary/Chest: Effort normal and breath sounds  normal. No respiratory distress. She has no wheezes. She has no rales. She exhibits no tenderness.  Genitourinary:  Genitourinary Comments: Shiny irritated skin around labia towards anus  Musculoskeletal: Normal range of motion.  Neurological: She is alert and oriented to person, place, and time.  Skin: Skin is warm, dry and intact. No  rash noted. No erythema. No pallor.  Psychiatric: She has a normal mood and affect. Her speech is normal and behavior is normal. Judgment and thought content normal. Cognition and memory are normal.  Nursing note and vitals reviewed.   Results for orders placed or performed in visit on 03/07/17  Microscopic Examination  Result Value Ref Range   WBC, UA 0-5 0 - 5 /hpf   RBC, UA 0-2 0 - 2 /hpf   Epithelial Cells (non renal) 0-10 0 - 10 /hpf   Renal Epithel, UA 0-10 (A) None seen /hpf   Casts Present (A) None seen /lpf   Cast Type Waxy casts (A) N/A   Bacteria, UA None seen None seen/Few  CBC with Differential/Platelet  Result Value Ref Range   WBC 6.1 3.4 - 10.8 x10E3/uL   RBC 4.65 3.77 - 5.28 x10E6/uL   Hemoglobin 13.3 11.1 - 15.9 g/dL   Hematocrit 39.7 34.0 - 46.6 %   MCV 85 79 - 97 fL   MCH 28.6 26.6 - 33.0 pg   MCHC 33.5 31.5 - 35.7 g/dL   RDW 14.6 12.3 - 15.4 %   Platelets 257 150 - 379 x10E3/uL   Neutrophils 47 Not Estab. %   Lymphs 36 Not Estab. %   Monocytes 11 Not Estab. %   Eos 5 Not Estab. %   Basos 1 Not Estab. %   Neutrophils Absolute 2.9 1.4 - 7.0 x10E3/uL   Lymphocytes Absolute 2.2 0.7 - 3.1 x10E3/uL   Monocytes Absolute 0.7 0.1 - 0.9 x10E3/uL   EOS (ABSOLUTE) 0.3 0.0 - 0.4 x10E3/uL   Basophils Absolute 0.0 0.0 - 0.2 x10E3/uL   Immature Granulocytes 0 Not Estab. %   Immature Grans (Abs) 0.0 0.0 - 0.1 x10E3/uL  Comprehensive metabolic panel  Result Value Ref Range   Glucose 83 65 - 99 mg/dL   BUN 21 8 - 27 mg/dL   Creatinine, Ser 0.75 0.57 - 1.00 mg/dL   GFR calc non Af Amer 85 >59 mL/min/1.73   GFR calc Af Amer 97 >59 mL/min/1.73   BUN/Creatinine Ratio 28 12 - 28   Sodium 143 134 - 144 mmol/L   Potassium 4.7 3.5 - 5.2 mmol/L   Chloride 105 96 - 106 mmol/L   CO2 25 20 - 29 mmol/L   Calcium 9.5 8.7 - 10.3 mg/dL   Total Protein 6.7 6.0 - 8.5 g/dL   Albumin 4.3 3.6 - 4.8 g/dL   Globulin, Total 2.4 1.5 - 4.5 g/dL   Albumin/Globulin Ratio 1.8 1.2 - 2.2    Bilirubin Total 0.4 0.0 - 1.2 mg/dL   Alkaline Phosphatase 83 39 - 117 IU/L   AST 25 0 - 40 IU/L   ALT 23 0 - 32 IU/L  Lipid Panel w/o Chol/HDL Ratio  Result Value Ref Range   Cholesterol, Total 161 100 - 199 mg/dL   Triglycerides 50 0 - 149 mg/dL   HDL 57 >39 mg/dL   VLDL Cholesterol Cal 10 5 - 40 mg/dL   LDL Calculated 94 0 - 99 mg/dL  TSH  Result Value Ref Range   TSH 3.200 0.450 - 4.500 uIU/mL  UA/M w/rflx Culture, Routine  Result Value Ref Range   Specific Gravity, UA 1.020 1.005 - 1.030   pH, UA 5.0 5.0 - 7.5   Color, UA Yellow Yellow   Appearance Ur Clear Clear   Leukocytes, UA Trace (A) Negative   Protein, UA Negative Negative/Trace   Glucose, UA Negative Negative   Ketones, UA Negative Negative   RBC, UA 2+ (A) Negative   Bilirubin, UA Negative Negative   Urobilinogen, Ur 0.2 0.2 - 1.0 mg/dL   Nitrite, UA Negative Negative   Microscopic Examination See below:   Hepatitis C Antibody  Result Value Ref Range   Hep C Virus Ab <0.1 0.0 - 0.9 s/co ratio  HIV antibody  Result Value Ref Range   HIV Screen 4th Generation wRfx Non Reactive Non Reactive  Microalbumin, Urine Waived  Result Value Ref Range   Microalb, Ur Waived 10 0 - 19 mg/L   Creatinine, Urine Waived 200 10 - 300 mg/dL   Microalb/Creat Ratio <30 <30 mg/g      Assessment & Plan:   Problem List Items Addressed This Visit      Musculoskeletal and Integument   Lichen sclerosus et atrophicus    Will start clobetasol. Recheck 1 month. Call with any concerns.         Genitourinary   Benign hypertensive renal disease - Primary    Under good control. Continue current regimen. Continue to monitor. Call with any concerns.       Relevant Orders   Comprehensive metabolic panel     Other   Hyperlipidemia    Under good control. Continue current regimen. Continue to monitor. Call with any concerns.       Relevant Medications   atorvastatin (LIPITOR) 20 MG tablet   lisinopril (PRINIVIL,ZESTRIL) 10 MG  tablet   Other Relevant Orders   Comprehensive metabolic panel   Lipid Panel w/o Chol/HDL Ratio   Leg cramps    Encouraged adequate hydration. Checking CMP. Await results. Call with any concerns.        Other Visit Diagnoses    Stress incontinence       Checking UA. Discussed PT- will consider. Treat lichen sclerosis first. Recheck 6 months.    Relevant Orders   UA/M w/rflx Culture, Routine   Gastroesophageal reflux disease, esophagitis presence not specified       Start zantac PRN. Call with any concerns.        Follow up plan: Return in about 1 month (around 10/03/2017) for follow up vaginal itching.

## 2017-09-05 NOTE — Assessment & Plan Note (Signed)
Under good control. Continue current regimen. Continue to monitor. Call with any concerns. 

## 2017-09-06 ENCOUNTER — Encounter: Payer: Self-pay | Admitting: Family Medicine

## 2017-09-06 LAB — LIPID PANEL W/O CHOL/HDL RATIO
CHOLESTEROL TOTAL: 184 mg/dL (ref 100–199)
HDL: 66 mg/dL (ref >39)
LDL CALC: 100 mg/dL — AB (ref 0–99)
TRIGLYCERIDES: 89 mg/dL (ref 0–149)
VLDL CHOLESTEROL CAL: 18 mg/dL (ref 5–40)

## 2017-09-06 LAB — COMPREHENSIVE METABOLIC PANEL
ALBUMIN: 4.5 g/dL (ref 3.6–4.8)
ALK PHOS: 82 IU/L (ref 39–117)
ALT: 22 IU/L (ref 0–32)
AST: 20 IU/L (ref 0–40)
Albumin/Globulin Ratio: 1.8 (ref 1.2–2.2)
BUN / CREAT RATIO: 12 (ref 12–28)
BUN: 13 mg/dL (ref 8–27)
Bilirubin Total: 0.4 mg/dL (ref 0.0–1.2)
CO2: 23 mmol/L (ref 20–29)
CREATININE: 1.05 mg/dL — AB (ref 0.57–1.00)
Calcium: 9.8 mg/dL (ref 8.7–10.3)
Chloride: 101 mmol/L (ref 96–106)
GFR calc Af Amer: 64 mL/min/{1.73_m2} (ref >59)
GFR calc non Af Amer: 56 mL/min/{1.73_m2} — ABNORMAL LOW (ref >59)
GLUCOSE: 87 mg/dL (ref 65–99)
Globulin, Total: 2.5 g/dL (ref 1.5–4.5)
Potassium: 4.5 mmol/L (ref 3.5–5.2)
Sodium: 140 mmol/L (ref 134–144)
TOTAL PROTEIN: 7 g/dL (ref 6.0–8.5)

## 2017-09-07 ENCOUNTER — Telehealth: Payer: Self-pay | Admitting: Family Medicine

## 2017-09-07 NOTE — Telephone Encounter (Signed)
Copied from [place] [phone]. Topic: Quick Communication - Rx Refill/Question >> Sep 07, 2017  4:44 PM Waylan Rocher, Lumin L wrote: Medication: clobetasol cream (TEMOVATE) 0.05 %  Has the patient contacted their pharmacy? Yes.   (Agent: If no, request that the patient contact the pharmacy for the refill.) Preferred Pharmacy (with phone number or street name): [place], [facility] [address] [place] [address] [place] [address] Phone: [phone] Fax: [phone] Agent: Please be advised that RX refills may take up to 3 business days. We ask that you follow-up with your pharmacy.  Patient is calling because the script costs $245.16 and would like something less expensive.

## 2017-09-07 NOTE — Telephone Encounter (Signed)
Pt calling because the prescription for Clobetasol Cream (Temovate) 0.05% costs $245.16 and would like something that is less expensive.

## 2017-09-08 ENCOUNTER — Telehealth: Payer: Self-pay | Admitting: Family Medicine

## 2017-09-08 MED ORDER — CLOBETASOL PROPIONATE 0.05 % EX CREA: 1.0000 "application " | TOPICAL_CREAM | Freq: Two times a day (BID) | CUTANEOUS | 1 refills | Status: DC

## 2017-09-08 NOTE — Telephone Encounter (Signed)
Can we find out if this is because of a deductible? This medicine shouldn't be that expensive

## 2017-09-08 NOTE — Telephone Encounter (Signed)
That looks like the full retail price. I can get her a good Rx coupon for $63.77 at [facility] if the pharmacy is not able to help.

## 2017-09-08 NOTE — Telephone Encounter (Signed)
Patient needs the script faxed to [facility] [address] and will send husband or herself by the office to pick up the coupon.  Thanks

## 2017-09-08 NOTE — Telephone Encounter (Signed)
Patient notified, she states that it is to much for a cream, she will let us know if she changes her mind.

## 2017-09-08 NOTE — Telephone Encounter (Signed)
Rx sent 

## 2017-09-08 NOTE — Telephone Encounter (Signed)
Please send prescription to [place].   Coupon printed and given to patients husband.

## 2017-10-10 ENCOUNTER — Ambulatory Visit (INDEPENDENT_AMBULATORY_CARE_PROVIDER_SITE_OTHER): Payer: Medicare Other | Admitting: Family Medicine

## 2017-10-10 ENCOUNTER — Encounter: Payer: Self-pay | Admitting: Family Medicine

## 2017-10-10 VITALS — BP 146/91 | HR 65 | Temp 97.7°F | Wt 146.3 lb

## 2017-10-10 DIAGNOSIS — L900 Lichen sclerosus et atrophicus: Secondary | ICD-10-CM

## 2017-10-10 MED ORDER — CLOBETASOL PROPIONATE 0.05 % EX CREA: 1.0000 "application " | TOPICAL_CREAM | CUTANEOUS | 1 refills | Status: DC

## 2017-10-10 NOTE — Progress Notes (Signed)
BP (!) 146/91 (BP Location: Left Arm, Patient Position: Sitting, Cuff Size: Normal)   Pulse 65   Temp 97.7 F (36.5 C)   Wt 146 lb 5 oz (66.4 kg)   SpO2 100%   BMI 26.42 kg/m    Subjective:    Patient ID: Jasmine Church, female    DOB: 08/11/1950, 67 y.o.   MRN: [id number]  HPI: Jasmine Church is a 67 y.o. female  Chief Complaint  Patient presents with  . Vaginal Itching    Patient states that the itching is better, but she would like to know if it is just going to start back when she stops the cream.    VAGINAL ITCHING- had a spot that made it a lot better. Itching is now better. Doing much better.  Duration: months Discharge description: None  Pruritus: yes Dysuria: no Malodorous: no Urinary frequency: yes- it is much better on the medicine Fevers: no Abdominal pain: no  History of sexually transmitted diseases: no Recent antibiotic use: no Context: better  Treatments attempted: clobetasol cream  Relevant past medical, surgical, family and social history reviewed and updated as indicated. Interim medical history since our last visit reviewed. Allergies and medications reviewed and updated.  Review of Systems  Constitutional: Negative.   Respiratory: Negative.   Cardiovascular: Negative.   Genitourinary: Negative.   Psychiatric/Behavioral: Negative.     Per HPI unless specifically indicated above     Objective:    BP (!) 146/91 (BP Location: Left Arm, Patient Position: Sitting, Cuff Size: Normal)   Pulse 65   Temp 97.7 F (36.5 C)   Wt 146 lb 5 oz (66.4 kg)   SpO2 100%   BMI 26.42 kg/m   Wt Readings from Last 3 Encounters:  10/10/17 146 lb 5 oz (66.4 kg)  09/05/17 149 lb 3 oz (67.7 kg)  03/07/17 144 lb 9 oz (65.6 kg)    Physical Exam  Constitutional: She is oriented to person, place, and time. She appears well-developed and well-nourished. No distress.  HENT:  Head: Normocephalic and atraumatic.  Right Ear: Hearing normal.  Left Ear: Hearing  normal.  Nose: Nose normal.  Eyes: Conjunctivae and lids are normal. Right eye exhibits no discharge. Left eye exhibits no discharge. No scleral icterus.  Pulmonary/Chest: Effort normal. No respiratory distress.  Musculoskeletal: Normal range of motion.  Neurological: She is alert and oriented to person, place, and time.  Skin: Skin is intact. No rash noted.  Psychiatric: She has a normal mood and affect. Her speech is normal and behavior is normal. Judgment and thought content normal. Cognition and memory are normal.    Results for orders placed or performed in visit on 09/05/17  Microscopic Examination  Result Value Ref Range   WBC, UA None seen 0 - 5 /hpf   RBC, UA 0-2 0 - 2 /hpf   Epithelial Cells (non renal) 0-10 0 - 10 /hpf   Bacteria, UA None seen None seen/Few  Comprehensive metabolic panel  Result Value Ref Range   Glucose 87 65 - 99 mg/dL   BUN 13 8 - 27 mg/dL   Creatinine, Ser 1.05 (H) 0.57 - 1.00 mg/dL   GFR calc non Af Amer 56 (L) >59 mL/min/1.73   GFR calc Af Amer 64 >59 mL/min/1.73   BUN/Creatinine Ratio 12 12 - 28   Sodium 140 134 - 144 mmol/L   Potassium 4.5 3.5 - 5.2 mmol/L   Chloride 101 96 - 106 mmol/L   CO2  23 20 - 29 mmol/L   Calcium 9.8 8.7 - 10.3 mg/dL   Total Protein 7.0 6.0 - 8.5 g/dL   Albumin 4.5 3.6 - 4.8 g/dL   Globulin, Total 2.5 1.5 - 4.5 g/dL   Albumin/Globulin Ratio 1.8 1.2 - 2.2   Bilirubin Total 0.4 0.0 - 1.2 mg/dL   Alkaline Phosphatase 82 39 - 117 IU/L   AST 20 0 - 40 IU/L   ALT 22 0 - 32 IU/L  Lipid Panel w/o Chol/HDL Ratio  Result Value Ref Range   Cholesterol, Total 184 100 - 199 mg/dL   Triglycerides 89 0 - 149 mg/dL   HDL 66 >39 mg/dL   VLDL Cholesterol Cal 18 5 - 40 mg/dL   LDL Calculated 100 (H) 0 - 99 mg/dL  UA/M w/rflx Culture, Routine  Result Value Ref Range   Specific Gravity, UA 1.020 1.005 - 1.030   pH, UA 5.0 5.0 - 7.5   Color, UA Yellow Yellow   Appearance Ur Clear Clear   Leukocytes, UA Negative Negative    Protein, UA Negative Negative/Trace   Glucose, UA Negative Negative   Ketones, UA Negative Negative   RBC, UA 2+ (A) Negative   Bilirubin, UA Negative Negative   Urobilinogen, Ur 0.2 0.2 - 1.0 mg/dL   Nitrite, UA Negative Negative   Microscopic Examination See below:       Assessment & Plan:   Problem List Items Addressed This Visit      Musculoskeletal and Integument   Lichen sclerosus et atrophicus - Primary    Doing much better on the clobetasol. Feeling much better. Will cut down to clobetasol to 2x a week.           Follow up plan: Return in about 5 months (around 03/12/2018) for Physical and wellness.

## 2017-10-10 NOTE — Assessment & Plan Note (Signed)
Doing much better on the clobetasol. Feeling much better. Will cut down to clobetasol to 2x a week.

## 2018-01-16 ENCOUNTER — Other Ambulatory Visit: Payer: Self-pay | Admitting: Family Medicine

## 2018-01-16 DIAGNOSIS — Z1231 Encounter for screening mammogram for malignant neoplasm of breast: Secondary | ICD-10-CM

## 2018-02-14 ENCOUNTER — Ambulatory Visit
Admission: RE | Admit: 2018-02-14 | Discharge: 2018-02-14 | Disposition: A | Discharge status: Home / Self Care | Payer: Medicare Other | Source: Ambulatory Visit | Attending: Family Medicine | Admitting: Family Medicine

## 2018-02-14 ENCOUNTER — Encounter: Payer: Self-pay | Admitting: Family Medicine

## 2018-02-14 DIAGNOSIS — Z1231 Encounter for screening mammogram for malignant neoplasm of breast: Secondary | ICD-10-CM | POA: Diagnosis not present

## 2018-03-12 ENCOUNTER — Ambulatory Visit (INDEPENDENT_AMBULATORY_CARE_PROVIDER_SITE_OTHER): Payer: Medicare Other

## 2018-03-12 DIAGNOSIS — Z23 Encounter for immunization: Secondary | ICD-10-CM | POA: Diagnosis not present

## 2018-03-24 ENCOUNTER — Other Ambulatory Visit: Payer: Self-pay | Admitting: Family Medicine

## 2018-03-26 NOTE — Telephone Encounter (Signed)
Requested Prescriptions  Pending Prescriptions Disp Refills  . atorvastatin (LIPITOR) 20 MG tablet [Pharmacy Med Name: ATORVASTATIN  20MG   TAB] 90 tablet 1    Sig: TAKE 1 TABLET BY MOUTH AT  BEDTIME     Cardiovascular:  Antilipid - Statins Failed - 03/24/2018  5:39 AM      Failed - LDL in normal range and within 360 days    LDL Calculated  Date Value Ref Range Status  09/05/2017 100 (H) 0 - 99 mg/dL Final         Passed - Total Cholesterol in normal range and within 360 days    Cholesterol, Total  Date Value Ref Range Status  09/05/2017 184 100 - 199 mg/dL Final         Passed - HDL in normal range and within 360 days    HDL  Date Value Ref Range Status  09/05/2017 66 >39 mg/dL Final         Passed - Triglycerides in normal range and within 360 days    Triglycerides  Date Value Ref Range Status  09/05/2017 89 0 - 149 mg/dL Final         Passed - Patient is not pregnant      Passed - Valid encounter within last 12 months    Recent Outpatient Visits          5 months ago Lichen sclerosus et atrophicus   [place], Megan P, DO   6 months ago Benign hypertensive renal disease   [facility] [place], Megan P, DO   1 year ago Routine general medical examination at a health care facility   [place], [place], DO   1 year ago Mixed hyperlipidemia   [facility] [place], [place], DO   2 years ago Routine general medical examination at a health care facility   [place], Barb Merino, DO      Future Appointments            In 2 weeks Wynetta Emery, Barb Merino, DO [facility]         . lisinopril (PRINIVIL,ZESTRIL) 10 MG tablet [Pharmacy Med Name: LISINOPRIL  10MG   TAB] 90 tablet 1    Sig: TAKE 1 TABLET BY MOUTH  DAILY     Cardiovascular:  ACE Inhibitors Failed - 03/24/2018  5:39 AM      Failed - Cr in normal range and within 180 days    Creatinine, Ser  Date Value Ref  Range Status  09/05/2017 1.05 (H) 0.57 - 1.00 mg/dL Final         Failed - K in normal range and within 180 days    Potassium  Date Value Ref Range Status  09/05/2017 4.5 3.5 - 5.2 mmol/L Final         Failed - Last BP in normal range    BP Readings from Last 1 Encounters:  10/10/17 (!) 146/91         Passed - Patient is not pregnant      Passed - Valid encounter within last 6 months    Recent Outpatient Visits          5 months ago Lichen sclerosus et atrophicus   [place], Megan P, DO   6 months ago Benign hypertensive renal disease   [facility] [place], Megan P, DO   1 year ago Routine general medical examination at a health  care facility   [place], [place], DO   1 year ago Mixed hyperlipidemia   [place], DO   2 years ago Routine general medical examination at a health care facility   [place], Barb Merino, DO      Future Appointments            In 2 weeks Wynetta Emery, Barb Merino, DO [facility]

## 2018-04-12 ENCOUNTER — Other Ambulatory Visit (HOSPITAL_COMMUNITY)
Admission: RE | Admit: 2018-04-12 | Discharge: 2018-04-12 | Disposition: A | Discharge status: Home / Self Care | Payer: Medicare Other | Source: Ambulatory Visit | Attending: Family Medicine | Admitting: Family Medicine

## 2018-04-12 ENCOUNTER — Ambulatory Visit (INDEPENDENT_AMBULATORY_CARE_PROVIDER_SITE_OTHER): Payer: Medicare Other | Admitting: Family Medicine

## 2018-04-12 ENCOUNTER — Encounter: Payer: Self-pay | Admitting: Family Medicine

## 2018-04-12 VITALS — BP 99/66 | HR 59 | Temp 97.9°F | Ht 61.5 in | Wt 143.5 lb

## 2018-04-12 DIAGNOSIS — E782 Mixed hyperlipidemia: Secondary | ICD-10-CM

## 2018-04-12 DIAGNOSIS — I129 Hypertensive chronic kidney disease with stage 1 through stage 4 chronic kidney disease, or unspecified chronic kidney disease: Secondary | ICD-10-CM

## 2018-04-12 DIAGNOSIS — Z0000 Encounter for general adult medical examination without abnormal findings: Secondary | ICD-10-CM

## 2018-04-12 DIAGNOSIS — Z1211 Encounter for screening for malignant neoplasm of colon: Secondary | ICD-10-CM | POA: Diagnosis not present

## 2018-04-12 DIAGNOSIS — Z13820 Encounter for screening for osteoporosis: Secondary | ICD-10-CM

## 2018-04-12 DIAGNOSIS — N183 Chronic kidney disease, stage 3 unspecified: Secondary | ICD-10-CM

## 2018-04-12 DIAGNOSIS — Z131 Encounter for screening for diabetes mellitus: Secondary | ICD-10-CM | POA: Diagnosis not present

## 2018-04-12 DIAGNOSIS — Z124 Encounter for screening for malignant neoplasm of cervix: Secondary | ICD-10-CM | POA: Diagnosis not present

## 2018-04-12 LAB — MICROALBUMIN, URINE WAIVED
CREATININE, URINE WAIVED: 100 mg/dL (ref 10–300)
Microalb, Ur Waived: 10 mg/L (ref 0–19)

## 2018-04-12 LAB — UA/M W/RFLX CULTURE, ROUTINE
BILIRUBIN UA: NEGATIVE
GLUCOSE, UA: NEGATIVE
KETONES UA: NEGATIVE
Nitrite, UA: NEGATIVE
PROTEIN UA: NEGATIVE
SPEC GRAV UA: 1.015 (ref 1.005–1.030)
UUROB: 0.2 mg/dL (ref 0.2–1.0)
pH, UA: 5 (ref 5.0–7.5)

## 2018-04-12 LAB — MICROSCOPIC EXAMINATION: Bacteria, UA: NONE SEEN

## 2018-04-12 LAB — COLOGUARD: Cologuard: NEGATIVE

## 2018-04-12 LAB — BAYER DCA HB A1C WAIVED: HB A1C: 5.4 % (ref <7.0)

## 2018-04-12 MED ORDER — LISINOPRIL 10 MG PO TABS: 5.0000 mg | ORAL_TABLET | Freq: Every day | ORAL | 1 refills | Status: DC

## 2018-04-12 NOTE — Patient Instructions (Addendum)
[facility]- Call for your Bone Density  Address: [address], [place], [place] [address]  Phone: [phone]   Health Maintenance for Postmenopausal Women Menopause is a normal process in which your reproductive ability comes to an end. This process happens gradually over a span of months to years, usually between the ages of 95 and 54. Menopause is complete when you have missed 12 consecutive menstrual periods. It is important to talk with your health care provider about some of the most common conditions that affect postmenopausal women, such as heart disease, cancer, and bone loss (osteoporosis). Adopting a healthy lifestyle and getting preventive care can help to promote your health and wellness. Those actions can also lower your chances of developing some of these common conditions. What should I know about menopause? During menopause, you may experience a number of symptoms, such as:  Moderate-to-severe hot flashes.  Night sweats.  Decrease in sex drive.  Mood swings.  Headaches.  Tiredness.  Irritability.  Memory problems.  Insomnia.  Choosing to treat or not to treat menopausal changes is an individual decision that you make with your health care provider. What should I know about hormone replacement therapy and supplements? Hormone therapy products are effective for treating symptoms that are associated with menopause, such as hot flashes and night sweats. Hormone replacement carries certain risks, especially as you become older. If you are thinking about using estrogen or estrogen with progestin treatments, discuss the benefits and risks with your health care provider. What should I know about heart disease and stroke? Heart disease, heart attack, and stroke become more likely as you age. This may be due, in part, to the hormonal changes that your body experiences during menopause. These can affect how your body processes dietary  fats, triglycerides, and cholesterol. Heart attack and stroke are both medical emergencies. There are many things that you can do to help prevent heart disease and stroke:  Have your blood pressure checked at least every 1-2 years. High blood pressure causes heart disease and increases the risk of stroke.  If you are 73-62 years old, ask your health care provider if you should take aspirin to prevent a heart attack or a stroke.  Do not use any tobacco products, including cigarettes, chewing tobacco, or electronic cigarettes. If you need help quitting, ask your health care provider.  It is important to eat a healthy diet and maintain a healthy weight. ? Be sure to include plenty of vegetables, fruits, low-fat dairy products, and lean protein. ? Avoid eating foods that are high in solid fats, added sugars, or salt (sodium).  Get regular exercise. This is one of the most important things that you can do for your health. ? Try to exercise for at least 150 minutes each week. The type of exercise that you do should increase your heart rate and make you sweat. This is known as moderate-intensity exercise. ? Try to do strengthening exercises at least twice each week. Do these in addition to the moderate-intensity exercise.  Know your numbers.Ask your health care provider to check your cholesterol and your blood glucose. Continue to have your blood tested as directed by your health care provider.  What should I know about cancer screening? There are several types of cancer. Take the following steps to reduce your risk and to catch any cancer development as early as possible. Breast Cancer  Practice breast self-awareness. ? This means understanding how your breasts normally appear and  feel. ? It also means doing regular breast self-exams. Let your health care provider know about any changes, no matter how small.  If you are 86 or older, have a clinician do a breast exam (clinical breast exam or  CBE) every year. Depending on your age, family history, and medical history, it may be recommended that you also have a yearly breast X-ray (mammogram).  If you have a family history of breast cancer, talk with your health care provider about genetic screening.  If you are at high risk for breast cancer, talk with your health care provider about having an MRI and a mammogram every year.  Breast cancer (BRCA) gene test is recommended for women who have family members with BRCA-related cancers. Results of the assessment will determine the need for genetic counseling and BRCA1 and for BRCA2 testing. BRCA-related cancers include these types: ? Breast. This occurs in males or females. ? Ovarian. ? Tubal. This may also be called fallopian tube cancer. ? Cancer of the abdominal or pelvic lining (peritoneal cancer). ? Prostate. ? Pancreatic.  Cervical, Uterine, and Ovarian Cancer Your health care provider may recommend that you be screened regularly for cancer of the pelvic organs. These include your ovaries, uterus, and vagina. This screening involves a pelvic exam, which includes checking for microscopic changes to the surface of your cervix (Pap test).  For women ages 21-65, health care providers may recommend a pelvic exam and a Pap test every three years. For women ages 54-65, they may recommend the Pap test and pelvic exam, combined with testing for human papilloma virus (HPV), every five years. Some types of HPV increase your risk of cervical cancer. Testing for HPV may also be done on women of any age who have unclear Pap test results.  Other health care providers may not recommend any screening for nonpregnant women who are considered low risk for pelvic cancer and have no symptoms. Ask your health care provider if a screening pelvic exam is right for you.  If you have had past treatment for cervical cancer or a condition that could lead to cancer, you need Pap tests and screening for cancer  for at least 20 years after your treatment. If Pap tests have been discontinued for you, your risk factors (such as having a new sexual partner) need to be reassessed to determine if you should start having screenings again. Some women have medical problems that increase the chance of getting cervical cancer. In these cases, your health care provider may recommend that you have screening and Pap tests more often.  If you have a family history of uterine cancer or ovarian cancer, talk with your health care provider about genetic screening.  If you have vaginal bleeding after reaching menopause, tell your health care provider.  There are currently no reliable tests available to screen for ovarian cancer.  Lung Cancer Lung cancer screening is recommended for adults 40-80 years old who are at high risk for lung cancer because of a history of smoking. A yearly low-dose CT scan of the lungs is recommended if you:  Currently smoke.  Have a history of at least 30 pack-years of smoking and you currently smoke or have quit within the past 15 years. A pack-year is smoking an average of one pack of cigarettes per day for one year.  Yearly screening should:  Continue until it has been 15 years since you quit.  Stop if you develop a health problem that would prevent you from having  lung cancer treatment.  Colorectal Cancer  This type of cancer can be detected and can often be prevented.  Routine colorectal cancer screening usually begins at age 66 and continues through age 20.  If you have risk factors for colon cancer, your health care provider may recommend that you be screened at an earlier age.  If you have a family history of colorectal cancer, talk with your health care provider about genetic screening.  Your health care provider may also recommend using home test kits to check for hidden blood in your stool.  A small camera at the end of a tube can be used to examine your colon directly  (sigmoidoscopy or colonoscopy). This is done to check for the earliest forms of colorectal cancer.  Direct examination of the colon should be repeated every 5-10 years until age 49. However, if early forms of precancerous polyps or small growths are found or if you have a family history or genetic risk for colorectal cancer, you may need to be screened more often.  Skin Cancer  Check your skin from head to toe regularly.  Monitor any moles. Be sure to tell your health care provider: ? About any new moles or changes in moles, especially if there is a change in a mole's shape or color. ? If you have a mole that is larger than the size of a pencil eraser.  If any of your family members has a history of skin cancer, especially at a young age, talk with your health care provider about genetic screening.  Always use sunscreen. Apply sunscreen liberally and repeatedly throughout the day.  Whenever you are outside, protect yourself by wearing long sleeves, pants, a wide-brimmed hat, and sunglasses.  What should I know about osteoporosis? Osteoporosis is a condition in which bone destruction happens more quickly than new bone creation. After menopause, you may be at an increased risk for osteoporosis. To help prevent osteoporosis or the bone fractures that can happen because of osteoporosis, the following is recommended:  If you are 67-7 years old, get at least 1,000 mg of calcium and at least 600 mg of vitamin D per day.  If you are older than age 40 but younger than age 37, get at least 1,200 mg of calcium and at least 600 mg of vitamin D per day.  If you are older than age 17, get at least 1,200 mg of calcium and at least 800 mg of vitamin D per day.  Smoking and excessive alcohol intake increase the risk of osteoporosis. Eat foods that are rich in calcium and vitamin D, and do weight-bearing exercises several times each week as directed by your health care provider. What should I know about  how menopause affects my mental health? Depression may occur at any age, but it is more common as you become older. Common symptoms of depression include:  Low or sad mood.  Changes in sleep patterns.  Changes in appetite or eating patterns.  Feeling an overall lack of motivation or enjoyment of activities that you previously enjoyed.  Frequent crying spells.  Talk with your health care provider if you think that you are experiencing depression. What should I know about immunizations? It is important that you get and maintain your immunizations. These include:  Tetanus, diphtheria, and pertussis (Tdap) booster vaccine.  Influenza every year before the flu season begins.  Pneumonia vaccine.  Shingles vaccine.  Your health care provider may also recommend other immunizations. This information is not intended  to replace advice given to you by your health care provider. Make sure you discuss any questions you have with your health care provider. Document Released: 07/15/2005 Document Revised: 12/11/2015 Document Reviewed: 02/24/2015 Elsevier Interactive Patient Education  2018 [employer].

## 2018-04-12 NOTE — Progress Notes (Signed)
ekg 

## 2018-04-12 NOTE — Progress Notes (Signed)
BP 99/66 (BP Location: Left Arm, Patient Position: Sitting, Cuff Size: Normal)   Pulse (!) 59   Temp 97.9 F (36.6 C) (Oral)   Ht 5' 1.5" (1.562 m)   Wt 143 lb 8 oz (65.1 kg)   SpO2 99%   BMI 26.68 kg/m    Subjective:    Patient ID: Jasmine Church, female    DOB: 1951-03-09, 67 y.o.   MRN: [id number]  HPI: ALAA MULLALLY is a 67 y.o. female presenting on 04/12/2018 for comprehensive medical examination. Current medical complaints include:  HYPERTENSION / HYPERLIPIDEMIA Satisfied with current treatment? yes Duration of hypertension: chronic BP monitoring frequency: not checking BP medication side effects: no Past BP meds: lisinopril Duration of hyperlipidemia: chronic Cholesterol medication side effects: no Cholesterol supplements: none Past cholesterol medications: Atorvastatin Medication compliance: excellent compliance Aspirin: yes Recent stressors: no Recurrent headaches: no Visual changes: no Palpitations: no Dyspnea: no Chest pain: no Lower extremity edema: no Dizzy/lightheaded: no  Menopausal Symptoms: no  Functional Status Survey: Is the patient deaf or have difficulty hearing?: No Does the patient have difficulty seeing, even when wearing glasses/contacts?: No Does the patient have difficulty concentrating, remembering, or making decisions?: No Does the patient have difficulty walking or climbing stairs?: No Does the patient have difficulty dressing or bathing?: No Does the patient have difficulty doing errands alone such as visiting a doctor's office or shopping?: No  Fall Risk  04/12/2018 03/07/2017 03/02/2015  Falls in the past year? 0 No No    Depression Screen Depression screen [facility] 2/9 03/07/2017 03/02/2016 03/02/2016 03/02/2015  Decreased Interest 0 0 0 0  Down, Depressed, Hopeless 0 0 0 0  PHQ - 2 Score 0 0 0 0    Advanced Directives Does patient have a HCPOA?    no Does patient have a living will or MOST form?  no  Past Medical History:  Past  Medical History:  Diagnosis Date  . Arthritis   . CKD (chronic kidney disease) stage 3, GFR 30-59 ml/min (HCC)   . Hyperlipidemia   . Hypertension   . Vaginal atrophy     Surgical History:  Past Surgical History:  Procedure Laterality Date  . HEMORROIDECTOMY    . HERNIA REPAIR      Medications:  Current Outpatient Medications on File Prior to Visit  Medication Sig  . aspirin EC 81 MG tablet Take 81 mg by mouth daily.  Marland Kitchen atorvastatin (LIPITOR) 20 MG tablet TAKE 1 TABLET BY MOUTH AT  BEDTIME  . Calcium Carbonate-Vitamin D (CALCIUM 500 + D) 500-125 MG-UNIT TABS Take by mouth daily. Patient takes it only 3 days a week.  . clobetasol cream (TEMOVATE) 8.09 % Apply 1 application topically 2 (two) times a week.   No current facility-administered medications on file prior to visit.     Allergies:  No Known Allergies  Social History:  Social History   Socioeconomic History  . Marital status: Married    Spouse name: Not on file  . Number of children: Not on file  . Years of education: Not on file  . Highest education level: Not on file  Occupational History  . Not on file  Social Needs  . Financial resource strain: Not on file  . Food insecurity:    Worry: Not on file    Inability: Not on file  . Transportation needs:    Medical: Not on file    Non-medical: Not on file  Tobacco Use  . Smoking status: Former  Smoker    Packs/day: 1.00    Years: 35.00    Pack years: 35.00    Types: Cigarettes    Last attempt to quit: 06/06/2004    Years since quitting: 13.8  . Smokeless tobacco: Never Used  Substance and Sexual Activity  . Alcohol use: No  . Drug use: No  . Sexual activity: Never  Lifestyle  . Physical activity:    Days per week: Not on file    Minutes per session: Not on file  . Stress: Not on file  Relationships  . Social connections:    Talks on phone: Not on file    Gets together: Not on file    Attends religious service: Not on file    Active member of  club or organization: Not on file    Attends meetings of clubs or organizations: Not on file    Relationship status: Not on file  . Intimate partner violence:    Fear of current or ex partner: Not on file    Emotionally abused: Not on file    Physically abused: Not on file    Forced sexual activity: Not on file  Other Topics Concern  . Not on file  Social History Narrative  . Not on file   Social History   Tobacco Use  Smoking Status Former Smoker  . Packs/day: 1.00  . Years: 35.00  . Pack years: 35.00  . Types: Cigarettes  . Last attempt to quit: 06/06/2004  . Years since quitting: 13.8  Smokeless Tobacco Never Used   Social History   Substance and Sexual Activity  Alcohol Use No    Family History:  Family History  Problem Relation Age of Onset  . Arthritis Mother   . Hyperlipidemia Mother   . Hypertension Mother   . Heart disease Father   . Hypertension Father   . Heart attack Father   . Cancer Sister        uterus  . Hypertension Sister   . Heart disease Brother   . Heart attack Brother   . Hypertension Brother   . Stroke Maternal Grandmother   . Heart attack Brother   . Hypertension Brother   . Diabetes Neg Hx   . Breast cancer Neg Hx     Past medical history, surgical history, medications, allergies, family history and social history reviewed with patient today and changes made to appropriate areas of the chart.   Review of Systems  Constitutional: Negative.   HENT: Negative.   Eyes: Negative.        Occasional auras  Respiratory: Negative.   Cardiovascular: Positive for leg swelling. Negative for chest pain, palpitations, orthopnea, claudication and PND.  Gastrointestinal: Positive for abdominal pain (last week- has resolved now) and heartburn. Negative for blood in stool, constipation, diarrhea, melena, nausea and vomiting.  Genitourinary: Negative.   Musculoskeletal: Negative.   Skin: Negative.   Neurological: Negative.   Endo/Heme/Allergies:  Negative for environmental allergies and polydipsia. Bruises/bleeds easily.  Psychiatric/Behavioral: Negative.     All other ROS negative except what is listed above and in the HPI.      Objective:    BP 99/66 (BP Location: Left Arm, Patient Position: Sitting, Cuff Size: Normal)   Pulse (!) 59   Temp 97.9 F (36.6 C) (Oral)   Ht 5' 1.5" (1.562 m)   Wt 143 lb 8 oz (65.1 kg)   SpO2 99%   BMI 26.68 kg/m   Wt Readings from Last 3 Encounters:  04/12/18 143 lb 8 oz (65.1 kg)  10/10/17 146 lb 5 oz (66.4 kg)  09/05/17 149 lb 3 oz (67.7 kg)     Hearing Screening   125Hz  250Hz  500Hz  1000Hz  2000Hz  3000Hz  4000Hz  6000Hz  8000Hz   Right ear:    25 25  25     Left ear:    25 25  25       Visual Acuity Screening   Right eye Left eye Both eyes  Without correction: 20/50 20/25 20/20   With correction:      Physical Exam  Constitutional: She is oriented to person, place, and time. She appears well-developed and well-nourished. No distress.  HENT:  Head: Normocephalic and atraumatic.  Right Ear: Hearing, tympanic membrane, external ear and ear canal normal.  Left Ear: Hearing, tympanic membrane, external ear and ear canal normal.  Nose: Nose normal.  Mouth/Throat: Uvula is midline, oropharynx is clear and moist and mucous membranes are normal. No oropharyngeal exudate.  Eyes: Pupils are equal, round, and reactive to light. Conjunctivae, EOM and lids are normal. Right eye exhibits no discharge. Left eye exhibits no discharge. No scleral icterus.  Neck: Normal range of motion. Neck supple. No JVD present. No tracheal deviation present. No thyromegaly present.  Cardiovascular: Normal rate, regular rhythm, normal heart sounds and intact distal pulses. Exam reveals no gallop and no friction rub.  No murmur heard. Pulmonary/Chest: Effort normal and breath sounds normal. No stridor. No respiratory distress. She has no wheezes. She has no rales. She exhibits no tenderness. Right breast exhibits no  inverted nipple, no mass, no nipple discharge, no skin change and no tenderness. Left breast exhibits no inverted nipple, no mass, no nipple discharge, no skin change and no tenderness. No breast swelling, tenderness, discharge or bleeding. Breasts are symmetrical.  Abdominal: Soft. Bowel sounds are normal. She exhibits no distension and no mass. There is no tenderness. There is no rebound and no guarding. No hernia.  Genitourinary:  Genitourinary Comments: Pelvic exam deferred with shared decision making  Musculoskeletal: Normal range of motion. She exhibits no edema, tenderness or deformity.  Lymphadenopathy:    She has no cervical adenopathy.  Neurological: She is alert and oriented to person, place, and time. She displays normal reflexes. No cranial nerve deficit or sensory deficit. She exhibits normal muscle tone. Coordination normal.  Skin: Skin is warm, dry and intact. Capillary refill takes less than 2 seconds. No rash noted. She is not diaphoretic. No erythema. No pallor.  Psychiatric: She has a normal mood and affect. Her speech is normal and behavior is normal. Judgment and thought content normal. Cognition and memory are normal.  Nursing note and vitals reviewed.   6CIT Screen 04/12/2018  What Year? 0 points  What month? 0 points  What time? 0 points  Count back from 20 0 points  Months in reverse 0 points  Repeat phrase 2 points  Total Score 2     Results for orders placed or performed in visit on 04/12/18  Microscopic Examination  Result Value Ref Range   WBC, UA 0-5 0 - 5 /hpf   RBC, UA 0-2 0 - 2 /hpf   Epithelial Cells (non renal) 0-10 0 - 10 /hpf   Bacteria, UA None seen None seen/Few  Bayer DCA Hb A1c Waived  Result Value Ref Range   HB A1C (BAYER DCA - WAIVED) 5.4 <7.0 %  CBC with Differential/Platelet  Result Value Ref Range   WBC 6.1 3.4 - 10.8 x10E3/uL   RBC 4.97  3.77 - 5.28 x10E6/uL   Hemoglobin 14.2 11.1 - 15.9 g/dL   Hematocrit 44.0 34.0 - 46.6 %   MCV  89 79 - 97 fL   MCH 28.6 26.6 - 33.0 pg   MCHC 32.3 31.5 - 35.7 g/dL   RDW 13.1 12.3 - 15.4 %   Platelets 276 150 - 450 x10E3/uL   Neutrophils 54 Not Estab. %   Lymphs 31 Not Estab. %   Monocytes 10 Not Estab. %   Eos 4 Not Estab. %   Basos 1 Not Estab. %   Neutrophils Absolute 3.3 1.4 - 7.0 x10E3/uL   Lymphocytes Absolute 1.9 0.7 - 3.1 x10E3/uL   Monocytes Absolute 0.6 0.1 - 0.9 x10E3/uL   EOS (ABSOLUTE) 0.2 0.0 - 0.4 x10E3/uL   Basophils Absolute 0.1 0.0 - 0.2 x10E3/uL   Immature Granulocytes 0 Not Estab. %   Immature Grans (Abs) 0.0 0.0 - 0.1 x10E3/uL  Comprehensive metabolic panel  Result Value Ref Range   Glucose 87 65 - 99 mg/dL   BUN 18 8 - 27 mg/dL   Creatinine, Ser 1.10 (H) 0.57 - 1.00 mg/dL   GFR calc non Af Amer 53 (L) >59 mL/min/1.73   GFR calc Af Amer 61 >59 mL/min/1.73   BUN/Creatinine Ratio 16 12 - 28   Sodium 140 134 - 144 mmol/L   Potassium 4.4 3.5 - 5.2 mmol/L   Chloride 100 96 - 106 mmol/L   CO2 22 20 - 29 mmol/L   Calcium 9.5 8.7 - 10.3 mg/dL   Total Protein 6.7 6.0 - 8.5 g/dL   Albumin 4.4 3.6 - 4.8 g/dL   Globulin, Total 2.3 1.5 - 4.5 g/dL   Albumin/Globulin Ratio 1.9 1.2 - 2.2   Bilirubin Total 0.4 0.0 - 1.2 mg/dL   Alkaline Phosphatase 92 39 - 117 IU/L   AST 23 0 - 40 IU/L   ALT 24 0 - 32 IU/L  Lipid Panel w/o Chol/HDL Ratio  Result Value Ref Range   Cholesterol, Total 163 100 - 199 mg/dL   Triglycerides 60 0 - 149 mg/dL   HDL 58 >39 mg/dL   VLDL Cholesterol Cal 12 5 - 40 mg/dL   LDL Calculated 93 0 - 99 mg/dL  Microalbumin, Urine Waived  Result Value Ref Range   Microalb, Ur Waived 10 0 - 19 mg/L   Creatinine, Urine Waived 100 10 - 300 mg/dL   Microalb/Creat Ratio <30 <30 mg/g  TSH  Result Value Ref Range   TSH 3.230 0.450 - 4.500 uIU/mL  UA/M w/rflx Culture, Routine  Result Value Ref Range   Specific Gravity, UA 1.015 1.005 - 1.030   pH, UA 5.0 5.0 - 7.5   Color, UA Yellow Yellow   Appearance Ur Hazy (A) Clear   Leukocytes, UA Trace  (A) Negative   Protein, UA Negative Negative/Trace   Glucose, UA Negative Negative   Ketones, UA Negative Negative   RBC, UA 2+ (A) Negative   Bilirubin, UA Negative Negative   Urobilinogen, Ur 0.2 0.2 - 1.0 mg/dL   Nitrite, UA Negative Negative   Microscopic Examination See below:       Assessment & Plan:   Problem List Items Addressed This Visit      Genitourinary   Benign hypertensive renal disease    Under good control on current regimen. Continue current regimen. Continue to monitor. Call with any concerns. Refills given. Labs drawn today.       Relevant Orders   CBC with Differential/Platelet (  Completed)   Comprehensive metabolic panel (Completed)   Microalbumin, Urine Waived (Completed)   TSH (Completed)   UA/M w/rflx Culture, Routine (Completed)   CKD (chronic kidney disease) stage 3, GFR 30-59 ml/min (HCC)    Checking labs today, await results.       Relevant Orders   CBC with Differential/Platelet (Completed)   Comprehensive metabolic panel (Completed)   Microalbumin, Urine Waived (Completed)   TSH (Completed)   UA/M w/rflx Culture, Routine (Completed)     Other   Hyperlipidemia    Under good control on current regimen. Continue current regimen. Continue to monitor. Call with any concerns. Refills given. Labs drawn today.      Relevant Medications   lisinopril (PRINIVIL,ZESTRIL) 10 MG tablet   Other Relevant Orders   CBC with Differential/Platelet (Completed)   Comprehensive metabolic panel (Completed)   Lipid Panel w/o Chol/HDL Ratio (Completed)    Other Visit Diagnoses    Welcome to Medicare preventive visit    -  Primary   Preventative care discussed today as below. Call with any concerns,    Relevant Orders   EKG 12-Lead (Completed)   Screening for diabetes mellitus       Labs drawn today, await results.    Relevant Orders   Bayer DCA Hb A1c Waived (Completed)   Screening for cervical cancer       Pap done today.    Relevant Orders    Cytology - PAP   Screening for osteoporosis       DEXA ordered today.   Relevant Orders   DG Bone Density   Screening for colon cancer       Colonoguard ordered today.   Relevant Orders   Cologuard       Preventative Services:  AAA screening: N/A Health Risk Assessment and Personalized Prevention Plan: Done today Bone Mass Measurements: Ordered today Breast Cancer Screening: Up to date CVD Screening: Done today Cervical Cancer Screening: Done today Colon Cancer Screening: Cologuard ordered Depression Screening: Done today Diabetes Screening: Done today Glaucoma Screening: See your Eye Doctor Hepatitis B vaccine: N/A Hepatitis C screening: Up to date HIV Screening: Up to date Flu Vaccine: Up to date Lung cancer Screening: Ordered today Obesity Screening: Done today Pneumonia Vaccines (2): Prevnar given today, pneumovax in 1 year STI Screening: N/A  Follow up plan: Return in about 6 months (around 10/11/2018).   LABORATORY TESTING:  - Pap smear: done today  IMMUNIZATIONS:   - Tdap: Tetanus vaccination status reviewed: last tetanus booster within 10 years. - Influenza: Up to date - Pneumovax: Refused - Prevnar: Refused - Zostavax vaccine: Refused  SCREENING: -Mammogram: Up to date  - Colonoscopy: Colonoguard ordered today  - Bone Density: Ordered today   PATIENT COUNSELING:   Advised to take 1 mg of folate supplement per day if capable of pregnancy.   Sexuality: Discussed sexually transmitted diseases, partner selection, use of condoms, avoidance of unintended pregnancy  and contraceptive alternatives.   Advised to avoid cigarette smoking.  I discussed with the patient that most people either abstain from alcohol or drink within safe limits (<=14/week and <=4 drinks/occasion for males, <=7/weeks and <= 3 drinks/occasion for females) and that the risk for alcohol disorders and other health effects rises proportionally with the number of drinks per week and how  often a drinker exceeds daily limits.  Discussed cessation/primary prevention of drug use and availability of treatment for abuse.   Diet: Encouraged to adjust caloric intake to maintain  or achieve  ideal body weight, to reduce intake of dietary saturated fat and total fat, to limit sodium intake by avoiding high sodium foods and not adding table salt, and to maintain adequate dietary potassium and calcium preferably from fresh fruits, vegetables, and low-fat dairy products.    stressed the importance of regular exercise  Injury prevention: Discussed safety belts, safety helmets, smoke detector, smoking near bedding or upholstery.   Dental health: Discussed importance of regular tooth brushing, flossing, and dental visits.    NEXT PREVENTATIVE PHYSICAL DUE IN 1 YEAR. Return in about 6 months (around 10/11/2018).

## 2018-04-13 LAB — CBC WITH DIFFERENTIAL/PLATELET
Basophils Absolute: 0.1 10*3/uL (ref 0.0–0.2)
Basos: 1 %
EOS (ABSOLUTE): 0.2 10*3/uL (ref 0.0–0.4)
EOS: 4 %
HEMATOCRIT: 44 % (ref 34.0–46.6)
Hemoglobin: 14.2 g/dL (ref 11.1–15.9)
Immature Grans (Abs): 0 10*3/uL (ref 0.0–0.1)
Immature Granulocytes: 0 %
LYMPHS ABS: 1.9 10*3/uL (ref 0.7–3.1)
Lymphs: 31 %
MCH: 28.6 pg (ref 26.6–33.0)
MCHC: 32.3 g/dL (ref 31.5–35.7)
MCV: 89 fL (ref 79–97)
MONOS ABS: 0.6 10*3/uL (ref 0.1–0.9)
Monocytes: 10 %
Neutrophils Absolute: 3.3 10*3/uL (ref 1.4–7.0)
Neutrophils: 54 %
Platelets: 276 10*3/uL (ref 150–450)
RBC: 4.97 x10E6/uL (ref 3.77–5.28)
RDW: 13.1 % (ref 12.3–15.4)
WBC: 6.1 10*3/uL (ref 3.4–10.8)

## 2018-04-13 LAB — COMPREHENSIVE METABOLIC PANEL
A/G RATIO: 1.9 (ref 1.2–2.2)
ALBUMIN: 4.4 g/dL (ref 3.6–4.8)
ALK PHOS: 92 IU/L (ref 39–117)
ALT: 24 IU/L (ref 0–32)
AST: 23 IU/L (ref 0–40)
BUN / CREAT RATIO: 16 (ref 12–28)
BUN: 18 mg/dL (ref 8–27)
Bilirubin Total: 0.4 mg/dL (ref 0.0–1.2)
CHLORIDE: 100 mmol/L (ref 96–106)
CO2: 22 mmol/L (ref 20–29)
Calcium: 9.5 mg/dL (ref 8.7–10.3)
Creatinine, Ser: 1.1 mg/dL — ABNORMAL HIGH (ref 0.57–1.00)
GFR calc non Af Amer: 53 mL/min/{1.73_m2} — ABNORMAL LOW (ref >59)
GFR, EST AFRICAN AMERICAN: 61 mL/min/{1.73_m2} (ref >59)
GLOBULIN, TOTAL: 2.3 g/dL (ref 1.5–4.5)
Glucose: 87 mg/dL (ref 65–99)
Potassium: 4.4 mmol/L (ref 3.5–5.2)
SODIUM: 140 mmol/L (ref 134–144)
Total Protein: 6.7 g/dL (ref 6.0–8.5)

## 2018-04-13 LAB — LIPID PANEL W/O CHOL/HDL RATIO
Cholesterol, Total: 163 mg/dL (ref 100–199)
HDL: 58 mg/dL (ref >39)
LDL Calculated: 93 mg/dL (ref 0–99)
Triglycerides: 60 mg/dL (ref 0–149)
VLDL CHOLESTEROL CAL: 12 mg/dL (ref 5–40)

## 2018-04-13 LAB — TSH: TSH: 3.23 u[IU]/mL (ref 0.450–4.500)

## 2018-04-15 ENCOUNTER — Encounter: Payer: Self-pay | Admitting: Family Medicine

## 2018-04-15 NOTE — Assessment & Plan Note (Signed)
Under good control on current regimen. Continue current regimen. Continue to monitor. Call with any concerns. Refills given. Labs drawn today.   

## 2018-04-15 NOTE — Assessment & Plan Note (Signed)
Checking labs today, await results.

## 2018-04-16 ENCOUNTER — Encounter: Payer: Self-pay | Admitting: Family Medicine

## 2018-04-16 LAB — CYTOLOGY - PAP
Diagnosis: NEGATIVE
HPV: NOT DETECTED

## 2018-04-18 ENCOUNTER — Telehealth: Payer: Self-pay | Admitting: *Deleted

## 2018-04-18 NOTE — Telephone Encounter (Signed)
Received referral for low dose lung cancer screening CT scan. Attempted to leave message at phone number listed in EMR for patient to call me back to facilitate scheduling scan. However, this option is not available. Will try to contact patient at a later date.

## 2018-04-20 ENCOUNTER — Telehealth: Payer: Self-pay | Admitting: *Deleted

## 2018-04-20 DIAGNOSIS — Z87891 Personal history of nicotine dependence: Secondary | ICD-10-CM

## 2018-04-20 DIAGNOSIS — Z122 Encounter for screening for malignant neoplasm of respiratory organs: Secondary | ICD-10-CM

## 2018-04-20 NOTE — Telephone Encounter (Signed)
Received referral for initial lung cancer screening scan. Contacted patient and obtained smoking history,(former, quit 2006, 33 pack year) as well as answering questions related to screening process. Patient denies signs of lung cancer such as weight loss or hemoptysis. Patient denies comorbidity that would prevent curative treatment if lung cancer were found. Patient is scheduled for shared decision making visit and CT scan on 05/14/18 at [place].

## 2018-04-24 DIAGNOSIS — Z1211 Encounter for screening for malignant neoplasm of colon: Secondary | ICD-10-CM | POA: Diagnosis not present

## 2018-04-24 DIAGNOSIS — Z1212 Encounter for screening for malignant neoplasm of rectum: Secondary | ICD-10-CM | POA: Diagnosis not present

## 2018-04-26 ENCOUNTER — Other Ambulatory Visit: Payer: Self-pay

## 2018-04-26 MED ORDER — LISINOPRIL 10 MG PO TABS: 5.0000 mg | ORAL_TABLET | Freq: Every day | ORAL | 1 refills | Status: DC

## 2018-05-04 ENCOUNTER — Ambulatory Visit: Payer: Self-pay | Admitting: *Deleted

## 2018-05-04 MED ORDER — LISINOPRIL 5 MG PO TABS: 5.0000 mg | ORAL_TABLET | Freq: Every day | ORAL | 1 refills | Status: DC

## 2018-05-04 NOTE — Telephone Encounter (Signed)
Please see intital encounter note, "Pt states that its hard to cut the lisinopril (PRINIVIL,ZESTRIL) 10 MG tablet in half it crumbles up and want to know if a RX for 5mg  can be sent to her pharmacy [facility], [place] [phone] (Phone) [phone] (Fax)'; last office visit 04/12/18 with Dr Park Liter;  Will route request to office for final disposition.  Reason for Disposition . Caller has URGENT medication question about med that PCP prescribed and triager unable to answer question  Answer Assessment - Initial Assessment Questions 1. SYMPTOMS: "Do you have any symptoms?"     no 2. SEVERITY: If symptoms are present, ask "Are they mild, moderate or severe?"      no  Protocols used: MEDICATION QUESTION CALL-A-AH

## 2018-05-04 NOTE — Addendum Note (Signed)
Addended by: Valerie Roys on: 05/04/2018 12:12 PM   Modules accepted: Orders

## 2018-05-14 ENCOUNTER — Ambulatory Visit
Admission: RE | Admit: 2018-05-14 | Discharge: 2018-05-14 | Disposition: A | Discharge status: Home / Self Care | Payer: Medicare Other | Source: Ambulatory Visit | Attending: Oncology | Admitting: Oncology

## 2018-05-14 ENCOUNTER — Encounter: Payer: Self-pay | Admitting: Oncology

## 2018-05-14 ENCOUNTER — Inpatient Hospital Stay: Payer: Medicare Other | Attending: Oncology | Admitting: Nurse Practitioner

## 2018-05-14 DIAGNOSIS — Z87891 Personal history of nicotine dependence: Secondary | ICD-10-CM | POA: Diagnosis not present

## 2018-05-14 DIAGNOSIS — Z122 Encounter for screening for malignant neoplasm of respiratory organs: Secondary | ICD-10-CM | POA: Diagnosis not present

## 2018-05-14 NOTE — Progress Notes (Signed)
In accordance with CMS guidelines, patient has met eligibility criteria including age, absence of signs or symptoms of lung cancer.  Social History   Tobacco Use  . Smoking status: Former Smoker    Packs/day: 1.00    Years: 33.00    Pack years: 33.00    Types: Cigarettes    Last attempt to quit: 06/06/2004    Years since quitting: 13.9  . Smokeless tobacco: Never Used  Substance Use Topics  . Alcohol use: No  . Drug use: No      A shared decision-making session was conducted prior to the performance of CT scan. This includes one or more decision aids, includes benefits and harms of screening, follow-up diagnostic testing, over-diagnosis, false positive rate, and total radiation exposure.   Counseling on the importance of adherence to annual lung cancer LDCT screening, impact of co-morbidities, and ability or willingness to undergo diagnosis and treatment is imperative for compliance of the program.   Counseling on the importance of continued smoking cessation for former smokers; the importance of smoking cessation for current smokers, and information about tobacco cessation interventions have been given to patient including [place] [facility] and [facility] quit [place] programs.   Written order for lung cancer screening with LDCT has been given to the patient and any and all questions have been answered to the best of my abilities.    Yearly follow up will be coordinated by Shawn Perkins, Thoracic Navigator.  Lauren Allen, DNP, AGNP-C [facility] [phone] (work cell) [phone] (office) 05/14/18 10:23 AM   

## 2018-05-15 ENCOUNTER — Other Ambulatory Visit: Payer: Self-pay

## 2018-05-15 MED ORDER — LISINOPRIL 5 MG PO TABS: 5.0000 mg | ORAL_TABLET | Freq: Every day | ORAL | 1 refills | Status: DC

## 2018-05-15 NOTE — Telephone Encounter (Signed)
Patient last seen 04/12/18 and has appointment 10/11/18.

## 2018-05-16 ENCOUNTER — Telehealth: Payer: Self-pay | Admitting: *Deleted

## 2018-05-16 NOTE — Telephone Encounter (Signed)
Notified patient of LDCT lung cancer screening program results with recommendation for 12 month follow up imaging. Also notified of incidental findings noted below and is encouraged to discuss further with PCP who will receive a copy of this note and/or the CT report. Patient verbalizes understanding.   IMPRESSION: 1. Lung-RADS 2S, benign appearance or behavior. Continue annual screening with low-dose chest CT without contrast in 12 months. 2. The "S" modifier above refers to potentially clinically significant non lung cancer related findings. Specifically, there is aortic atherosclerosis, in addition to left main and 2 vessel coronary artery disease. Please note that although the presence of coronary artery calcium documents the presence of coronary artery disease, the severity of this disease and any potential stenosis cannot be assessed on this non-gated CT examination. Assessment for potential risk factor modification, dietary therapy or pharmacologic therapy may be warranted, if clinically indicated. 3. Mild diffuse bronchial wall thickening with mild centrilobular and paraseptal emphysema; imaging findings suggestive of underlying COPD. 4. Moderate-sized hiatal hernia.  Aortic Atherosclerosis (ICD10-I70.0) and Emphysema (ICD10-J43.9).

## 2018-05-17 ENCOUNTER — Encounter: Payer: Self-pay | Admitting: Family Medicine

## 2018-05-17 ENCOUNTER — Other Ambulatory Visit: Payer: Self-pay

## 2018-05-17 DIAGNOSIS — J449 Chronic obstructive pulmonary disease, unspecified: Secondary | ICD-10-CM | POA: Insufficient documentation

## 2018-05-17 DIAGNOSIS — K449 Diaphragmatic hernia without obstruction or gangrene: Secondary | ICD-10-CM | POA: Insufficient documentation

## 2018-05-17 DIAGNOSIS — I2510 Atherosclerotic heart disease of native coronary artery without angina pectoris: Secondary | ICD-10-CM | POA: Insufficient documentation

## 2018-05-17 DIAGNOSIS — Z1211 Encounter for screening for malignant neoplasm of colon: Secondary | ICD-10-CM

## 2018-05-17 NOTE — Telephone Encounter (Signed)
Thank you :)

## 2018-05-17 NOTE — Telephone Encounter (Signed)
Please let her know that she has some plaque in the arteries of her heart on her CT- I'd like to get her into the cardiologist to make sure her heart is doing OK if she's OK with that. If she is I'll put the referral in. Not a rush at all.

## 2018-05-17 NOTE — Telephone Encounter (Signed)
Message relayed to patient. Verbalized understanding and denied questions. Patient denied referral to cardiology stated, "I think I'm okay"

## 2018-06-05 ENCOUNTER — Ambulatory Visit
Admission: RE | Admit: 2018-06-05 | Discharge: 2018-06-05 | Disposition: A | Discharge status: Home / Self Care | Payer: Medicare Other | Source: Ambulatory Visit | Attending: Family Medicine | Admitting: Family Medicine

## 2018-06-05 DIAGNOSIS — M810 Age-related osteoporosis without current pathological fracture: Secondary | ICD-10-CM | POA: Diagnosis not present

## 2018-06-05 DIAGNOSIS — M85832 Other specified disorders of bone density and structure, left forearm: Secondary | ICD-10-CM | POA: Insufficient documentation

## 2018-06-05 DIAGNOSIS — Z13820 Encounter for screening for osteoporosis: Secondary | ICD-10-CM | POA: Diagnosis not present

## 2018-06-07 ENCOUNTER — Ambulatory Visit (INDEPENDENT_AMBULATORY_CARE_PROVIDER_SITE_OTHER): Payer: Medicare Other | Admitting: Family Medicine

## 2018-06-07 ENCOUNTER — Encounter: Payer: Self-pay | Admitting: Family Medicine

## 2018-06-07 VITALS — BP 139/88 | HR 73 | Temp 97.8°F | Ht 62.0 in | Wt 147.2 lb

## 2018-06-07 DIAGNOSIS — B029 Zoster without complications: Secondary | ICD-10-CM

## 2018-06-07 DIAGNOSIS — M810 Age-related osteoporosis without current pathological fracture: Secondary | ICD-10-CM | POA: Insufficient documentation

## 2018-06-07 DIAGNOSIS — M816 Localized osteoporosis [Lequesne]: Secondary | ICD-10-CM

## 2018-06-07 DIAGNOSIS — K5900 Constipation, unspecified: Secondary | ICD-10-CM

## 2018-06-07 MED ORDER — VALACYCLOVIR HCL 1 G PO TABS: 1000.0000 mg | ORAL_TABLET | Freq: Three times a day (TID) | ORAL | 0 refills | Status: AC

## 2018-06-07 MED ORDER — POLYETHYLENE GLYCOL 3350 17 GM/SCOOP PO POWD: 17.0000 g | Freq: Three times a day (TID) | ORAL | 1 refills | Status: AC | PRN

## 2018-06-07 NOTE — Patient Instructions (Addendum)
Osteoporosis  Osteoporosis happens when your bones get thin and weak. This can cause your bones to break (fracture) more easily. You can do things at home to make your bones stronger. Follow these instructions at home:  Activity  Exercise as told by your doctor. Ask your doctor what activities are safe for you. You should do: ? Exercises that make your muscles work to hold your body weight up (weight-bearing exercises). These include tai chi, yoga, and walking. ? Exercises to make your muscles stronger. One example is lifting weights. Lifestyle  Limit alcohol intake to no more than 1 drink a day for nonpregnant women and 2 drinks a day for men. One drink equals 12 oz of beer, 5 oz of wine, or 1 oz of hard liquor.  Do not use any products that have nicotine or tobacco in them. These include cigarettes and e-cigarettes. If you need help quitting, ask your doctor. Preventing falls  Use tools to help you move around (mobility aids) as needed. These include canes, walkers, scooters, and crutches.  Keep rooms well-lit and free of clutter.  Put away things that could make you trip. These include cords and rugs.  Install safety rails on stairs. Install grab bars in bathrooms.  Use rubber mats in slippery areas, like bathrooms.  Wear shoes that: ? Fit you well. ? Support your feet. ? Have closed toes. ? Have rubber soles or low heels.  Tell your doctor about all of the medicines you are taking. Some medicines can make you more likely to fall. General instructions  Eat plenty of calcium and vitamin D. These nutrients are good for your bones. Good sources of calcium and vitamin D include: ? Some fatty fish, such as salmon and tuna. ? Foods that have calcium and vitamin D added to them (fortified foods). For example, some breakfast cereals are fortified with calcium and vitamin D. ? Egg yolks. ? Cheese. ? Liver.  Take over-the-counter and prescription medicines only as told by your  doctor.  Keep all follow-up visits as told by your doctor. This is important. Contact a doctor if:  You have not been tested (screened) for osteoporosis and you are: ? A woman who is age 37 or older. ? A man who is age 68 or older. Get help right away if:  You fall.  You get hurt. Summary  Osteoporosis happens when your bones get thin and weak.  Weak bones can break (fracture) more easily.  Eat plenty of calcium and vitamin D. These nutrients are good for your bones.  Tell your doctor about all of the medicines that you take. This information is not intended to replace advice given to you by your health care provider. Make sure you discuss any questions you have with your health care provider. Document Released: 08/15/2011 Document Revised: 03/17/2017 Document Reviewed: 03/17/2017 Elsevier Interactive Patient Education  2019 [employer], which is also known as herpes zoster, is an infection that causes a painful skin rash and fluid-filled blisters. It is caused by a virus. Shingles only develops in people who:  Have had chickenpox.  Have been given a medicine to protect against chickenpox (have been vaccinated). Shingles is rare in this group. What are the causes? Shingles is caused by varicella-zoster virus (VZV). This is the same virus that causes chickenpox. After a person is exposed to VZV, the virus stays in the body in an inactive (dormant) state. Shingles develops if the virus is reactivated. This can happen  many years after the first (initial) exposure to VZV. It is not known what causes this virus to be reactivated. What increases the risk? People who have had chickenpox or received the chickenpox vaccine are at risk for shingles. Shingles infection is more common in people who:  Are older than age 57.  Have a weakened disease-fighting system (immune system), such as people with: ? HIV. ? AIDS. ? Cancer.  Are taking medicines that weaken  the immune system, such as transplant medicines.  Are experiencing a lot of stress. What are the signs or symptoms? Early symptoms of this condition include itching, tingling, and pain in an area on your skin. Pain may be described as burning, stabbing, or throbbing. A few days or weeks after early symptoms start, a painful red rash appears. The rash is usually on one side of the body and has a band-like or belt-like pattern. The rash eventually turns into fluid-filled blisters that break open, change into scabs, and dry up in about 2-3 weeks. At any time during the infection, you may also develop:  A fever.  Chills.  A headache.  An upset stomach. How is this diagnosed? This condition is diagnosed with a skin exam. Skin or fluid samples may be taken from the blisters before a diagnosis is made. These samples are examined under a microscope or sent to a lab for testing. How is this treated? The rash may last for several weeks. There is not a specific cure for this condition. Your health care provider will probably prescribe medicines to help you manage pain, recover more quickly, and avoid long-term problems. Medicines may include:  Antiviral drugs.  Anti-inflammatory drugs.  Pain medicines.  Anti-itching medicines (antihistamines). If the area involved is on your face, you may be referred to a specialist, such as an eye doctor (ophthalmologist) or an ear, nose, and throat (ENT) doctor (otolaryngologist) to help you avoid eye problems, chronic pain, or disability. Follow these instructions at home: Medicines  Take over-the-counter and prescription medicines only as told by your health care provider.  Apply an anti-itch cream or numbing cream to the affected area as told by your health care provider. Relieving itching and discomfort   Apply cold, wet cloths (cold compresses) to the area of the rash or blisters as told by your health care provider.  Cool baths can be soothing.  Try adding baking soda or dry oatmeal to the water to reduce itching. Do not bathe in hot water. Blister and rash care  Keep your rash covered with a loose bandage (dressing). Wear loose-fitting clothing to help ease the pain of material rubbing against the rash.  Keep your rash and blisters clean by washing the area with mild soap and cool water as told by your health care provider.  Check your rash every day for signs of infection. Check for: ? More redness, swelling, or pain. ? Fluid or blood. ? Warmth. ? Pus or a bad smell.  Do not scratch your rash or pick at your blisters. To help avoid scratching: ? Keep your fingernails clean and cut short. ? Wear gloves or mittens while you sleep, if scratching is a problem. General instructions  Rest as told by your health care provider.  Keep all follow-up visits as told by your health care provider. This is important.  Wash your hands often with soap and water. If soap and water are not available, use hand sanitizer. Doing this lowers your chance of getting a bacterial skin infection.  Before your blisters change into scabs, your shingles infection can cause chickenpox in people who have never had it or have never been vaccinated against it. To prevent this from happening, avoid contact with other people, especially: ? Babies. ? Pregnant women. ? Children who have eczema. ? Elderly people who have transplants. ? People who have chronic illnesses, such as cancer or AIDS. Contact a health care provider if:  Your pain is not relieved with prescribed medicines.  Your pain does not get better after the rash heals.  You have signs of infection in the rash area, such as: ? More redness, swelling, or pain around the rash. ? Fluid or blood coming from the rash. ? The rash area feeling warm to the touch. ? Pus or a bad smell coming from the rash. Get help right away if:  The rash is on your face or nose.  You have facial pain, pain  around your eye area, or loss of feeling on one side of your face.  You have difficulty seeing.  You have ear pain or have ringing in your ear.  You have a loss of taste.  Your condition gets worse. Summary  Shingles, which is also known as herpes zoster, is an infection that causes a painful skin rash and fluid-filled blisters.  This condition is diagnosed with a skin exam. Skin or fluid samples may be taken from the blisters and examined before the diagnosis is made.  Keep your rash covered with a loose bandage (dressing). Wear loose-fitting clothing to help ease the pain of material rubbing against the rash.  Before your blisters change into scabs, your shingles infection can cause chickenpox in people who have never had it or have never been vaccinated against it. This information is not intended to replace advice given to you by your health care provider. Make sure you discuss any questions you have with your health care provider. Document Released: 05/23/2005 Document Revised: 01/25/2017 Document Reviewed: 01/25/2017 Elsevier Interactive Patient Education  2019 [employer].  Constipation, Adult Constipation is when a person has fewer bowel movements in a week than normal, has difficulty having a bowel movement, or has stools that are dry, hard, or larger than normal. Constipation may be caused by an underlying condition. It may become worse with age if a person takes certain medicines and does not take in enough fluids. Follow these instructions at home: Eating and drinking   Eat foods that have a lot of fiber, such as fresh fruits and vegetables, whole grains, and beans.  Limit foods that are high in fat, low in fiber, or overly processed, such as french fries, hamburgers, cookies, candies, and soda.  Drink enough fluid to keep your urine clear or pale yellow. General instructions  Exercise regularly or as told by your health care provider.  Go to the restroom when you  have the urge to go. Do not hold it in.  Take over-the-counter and prescription medicines only as told by your health care provider. These include any fiber supplements.  Practice pelvic floor retraining exercises, such as deep breathing while relaxing the lower abdomen and pelvic floor relaxation during bowel movements.  Watch your condition for any changes.  Keep all follow-up visits as told by your health care provider. This is important. Contact a health care provider if:  You have pain that gets worse.  You have a fever.  You do not have a bowel movement after 4 days.  You vomit.  You are not  hungry.  You lose weight.  You are bleeding from the anus.  You have thin, pencil-like stools. Get help right away if:  You have a fever and your symptoms suddenly get worse.  You leak stool or have blood in your stool.  Your abdomen is bloated.  You have severe pain in your abdomen.  You feel dizzy or you faint. This information is not intended to replace advice given to you by your health care provider. Make sure you discuss any questions you have with your health care provider. Document Released: 02/19/2004 Document Revised: 12/11/2015 Document Reviewed: 11/11/2015 Elsevier Interactive Patient Education  2019 [employer].

## 2018-06-07 NOTE — Progress Notes (Signed)
BP 139/88 (BP Location: Left Arm, Patient Position: Sitting, Cuff Size: Normal)   Pulse 73   Temp 97.8 F (36.6 C) (Oral)   Ht 5\' 2"  (1.575 m)   Wt 147 lb 3.2 oz (66.8 kg)   SpO2 97%   BMI 26.92 kg/m    Subjective:    Patient ID: Jasmine Church, female    DOB: [birth year], 68 y.o.   MRN: [id number]  HPI: Jasmine Church is a 68 y.o. female  Chief Complaint  Patient presents with  . Rash    Patient states there was nothing there, but she'd touch the area and it was very sensivtive. Then a rash appeared. Ongoing about 3 days.   . Constipation    Patient complains of constipation since Thanksgiving.   RASH Duration:  3 days  Location: under bra line- on the R  Itching: no Burning: yes Redness: yes Oozing: no Scaling: no Blisters: yes Painful: yes Fevers: no Change in detergents/soaps/personal care products: no Recent illness: no Recent travel:no History of same: no Context: better Alleviating factors: lotion/moisturizer Treatments attempted:lotion/moisturizer Shortness of breath: no  Throat/tongue swelling: no Myalgias/arthralgias: no   Has been constipated- has been drinking more egg nog which seems to constipate her. Has been using fiber with mild benefit.    Relevant past medical, surgical, family and social history reviewed and updated as indicated. Interim medical history since our last visit reviewed. Allergies and medications reviewed and updated.  Review of Systems  Constitutional: Negative.   Respiratory: Negative.   Cardiovascular: Negative.   Gastrointestinal: Positive for constipation. Negative for abdominal distention, abdominal pain, anal bleeding, blood in stool, diarrhea, nausea, rectal pain and vomiting.  Musculoskeletal: Negative.   Skin: Positive for rash. Negative for color change, pallor and wound.  Psychiatric/Behavioral: Negative.     Per HPI unless specifically indicated above     Objective:    BP 139/88 (BP Location: Left Arm,  Patient Position: Sitting, Cuff Size: Normal)   Pulse 73   Temp 97.8 F (36.6 C) (Oral)   Ht 5\' 2"  (1.575 m)   Wt 147 lb 3.2 oz (66.8 kg)   SpO2 97%   BMI 26.92 kg/m   Wt Readings from Last 3 Encounters:  06/07/18 147 lb 3.2 oz (66.8 kg)  05/14/18 143 lb (64.9 kg)  04/12/18 143 lb 8 oz (65.1 kg)    Physical Exam Vitals signs and nursing note reviewed.  Constitutional:      General: She is not in acute distress.    Appearance: Normal appearance. She is not ill-appearing, toxic-appearing or diaphoretic.  HENT:     Head: Normocephalic and atraumatic.     Right Ear: External ear normal.     Left Ear: External ear normal.     Nose: Nose normal.     Mouth/Throat:     Mouth: Mucous membranes are moist.     Pharynx: Oropharynx is clear.  Eyes:     General: No scleral icterus.       Right eye: No discharge.        Left eye: No discharge.     Extraocular Movements: Extraocular movements intact.     Conjunctiva/sclera: Conjunctivae normal.     Pupils: Pupils are equal, round, and reactive to light.  Neck:     Musculoskeletal: Normal range of motion and neck supple.  Cardiovascular:     Rate and Rhythm: Normal rate and regular rhythm.     Pulses: Normal pulses.  Heart sounds: Normal heart sounds. No murmur. No friction rub. No gallop.   Pulmonary:     Effort: Pulmonary effort is normal. No respiratory distress.     Breath sounds: Normal breath sounds. No stridor. No wheezing, rhonchi or rales.  Chest:     Chest wall: No tenderness.  Musculoskeletal: Normal range of motion.  Skin:    General: Skin is warm and dry.     Capillary Refill: Capillary refill takes less than 2 seconds.     Coloration: Skin is not jaundiced or pale.     Findings: Rash (vesicular rash around R side of body under breast) present. No bruising, erythema or lesion.  Neurological:     General: No focal deficit present.     Mental Status: She is alert and oriented to person, place, and time. Mental status  is at baseline.  Psychiatric:        Mood and Affect: Mood normal.        Behavior: Behavior normal.        Thought Content: Thought content normal.        Judgment: Judgment normal.     Results for orders placed or performed in visit on 05/17/18  Cologuard  Result Value Ref Range   Cologuard Negative       Assessment & Plan:   Problem List Items Addressed This Visit      Musculoskeletal and Integument   Osteoporosis    Newly diagnosed on DEXA- refuses medication. Will repeat DEXA in 3 years.        Other Visit Diagnoses    Herpes zoster without complication    -  Primary   Relevant Medications   valACYclovir (VALTREX) 1000 MG tablet   Constipation, unspecified constipation type       WIll treat with miralax. Call if not getting better or getting worse.        Follow up plan: Return if symptoms worsen or fail to improve.

## 2018-06-07 NOTE — Assessment & Plan Note (Signed)
Newly diagnosed on DEXA- refuses medication. Will repeat DEXA in 3 years.

## 2018-10-04 ENCOUNTER — Other Ambulatory Visit: Payer: Self-pay | Admitting: Family Medicine

## 2018-10-04 MED ORDER — ATORVASTATIN CALCIUM 20 MG PO TABS: ORAL_TABLET | ORAL | 0 refills | Status: DC

## 2018-10-11 ENCOUNTER — Encounter: Payer: Self-pay | Admitting: Family Medicine

## 2018-10-11 ENCOUNTER — Other Ambulatory Visit: Payer: Self-pay

## 2018-10-11 ENCOUNTER — Ambulatory Visit (INDEPENDENT_AMBULATORY_CARE_PROVIDER_SITE_OTHER): Payer: Medicare Other | Admitting: Family Medicine

## 2018-10-11 VITALS — Wt 146.0 lb

## 2018-10-11 DIAGNOSIS — I129 Hypertensive chronic kidney disease with stage 1 through stage 4 chronic kidney disease, or unspecified chronic kidney disease: Secondary | ICD-10-CM

## 2018-10-11 DIAGNOSIS — N183 Chronic kidney disease, stage 3 unspecified: Secondary | ICD-10-CM

## 2018-10-11 DIAGNOSIS — E782 Mixed hyperlipidemia: Secondary | ICD-10-CM | POA: Diagnosis not present

## 2018-10-11 DIAGNOSIS — J449 Chronic obstructive pulmonary disease, unspecified: Secondary | ICD-10-CM

## 2018-10-11 MED ORDER — ATORVASTATIN CALCIUM 20 MG PO TABS: ORAL_TABLET | ORAL | 1 refills | Status: DC

## 2018-10-11 MED ORDER — LISINOPRIL 5 MG PO TABS: 5.0000 mg | ORAL_TABLET | Freq: Every day | ORAL | 1 refills | Status: DC

## 2018-10-11 NOTE — Progress Notes (Signed)
Wt 146 lb (66.2 kg)   BMI 26.70 kg/m    Subjective:    Patient ID: Jasmine Church, female    DOB: 06/13/1950, 68 y.o.   MRN: [id number]  HPI: Jasmine Church is a 68 y.o. female  Chief Complaint  Patient presents with  . Follow-up  . Hypertension  . Hyperlipidemia  . Chronic Kidney Disease   HYPERTENSION / [place] Satisfied with current treatment? yes Duration of hypertension: chronic BP monitoring frequency: not checking BP medication side effects: no Past BP meds: lisinopril Duration of hyperlipidemia: chronic Cholesterol medication side effects: no Cholesterol supplements: none Past cholesterol medications: atorvastin Medication compliance: excellent compliance Aspirin: yes Recent stressors: no Recurrent headaches: no Visual changes: no Palpitations: no Dyspnea: no Chest pain: no Lower extremity edema: no Dizzy/lightheaded: no  COPD COPD status: controlled Satisfied with current treatment?: yes Oxygen use: no Dyspnea frequency: rarely Cough frequency: rarely Rescue inhaler frequency: rarely  Limitation of activity: no Productive cough: no   Relevant past medical, surgical, family and social history reviewed and updated as indicated. Interim medical history since our last visit reviewed. Allergies and medications reviewed and updated.  Review of Systems  Constitutional: Negative.   Respiratory: Negative.   Cardiovascular: Negative.   Gastrointestinal: Negative.   Psychiatric/Behavioral: Negative.     Per HPI unless specifically indicated above     Objective:    Wt 146 lb (66.2 kg)   BMI 26.70 kg/m   Wt Readings from Last 3 Encounters:  10/11/18 146 lb (66.2 kg)  06/07/18 147 lb 3.2 oz (66.8 kg)  05/14/18 143 lb (64.9 kg)    Physical Exam Vitals signs and nursing note reviewed.  Pulmonary:     Effort: Pulmonary effort is normal. No respiratory distress.     Comments: Speaking in full sentences Neurological:     Mental Status: She  is alert.  Psychiatric:        Mood and Affect: Mood normal.        Behavior: Behavior normal.        Thought Content: Thought content normal.        Judgment: Judgment normal.     Results for orders placed or performed in visit on 05/17/18  Cologuard  Result Value Ref Range   Cologuard Negative       Assessment & Plan:   Problem List Items Addressed This Visit      Respiratory   COPD (chronic obstructive pulmonary disease) ([place])    Under good control on current regimen. Continue current regimen. Continue to monitor. Call with any concerns. Refills given.        Relevant Orders   Comprehensive metabolic panel     Genitourinary   Benign hypertensive renal disease - Primary    Feeling well. Under good control on current regimen. Continue current regimen. Continue to monitor. Call with any concerns. Refills given. Will get labs and check BP ASAP      Relevant Orders   Comprehensive metabolic panel   Microalbumin, Urine Waived   TSH   CKD (chronic kidney disease) stage 3, GFR 30-59 ml/min ([facility])    Under good control on current regimen. Continue current regimen. Continue to monitor. Call with any concerns. Refills given. Will get labs ASAP       Relevant Orders   Comprehensive metabolic panel   Microalbumin, Urine Waived   CBC with Differential/Platelet   UA/M w/rflx Culture, Routine     Other   Hyperlipidemia    Under  good control on current regimen. Continue current regimen. Continue to monitor. Call with any concerns. Refills given. Will get labs ASAP      Relevant Medications   lisinopril (ZESTRIL) 5 MG tablet   atorvastatin (LIPITOR) 20 MG tablet   Other Relevant Orders   Comprehensive metabolic panel   Lipid Panel w/o Chol/HDL Ratio       Follow up plan: Return in about 6 months (around 04/13/2019) for Physical/Wellness.   . This visit was completed via telephone due to the restrictions of the COVID-19 pandemic. All issues as above were discussed and  addressed but no physical exam was performed. If it was felt that the patient should be evaluated in the office, they were directed there. The patient verbally consented to this visit. Patient was unable to complete an audio/visual visit due to Lack of equipment. Due to the catastrophic nature of the COVID-19 pandemic, this visit was done through audio contact only. . Location of the patient: home . Location of the provider: home . Those involved with this call:  . Provider: Park Liter, DO . CMA: Gerda Diss, CMA . Front Desk/Registration: Don Perking  . Time spent on call: 25 minutes on the phone discussing health concerns. 40 minutes total spent in review of patient's record and preparation of their chart.

## 2018-10-11 NOTE — Assessment & Plan Note (Signed)
Feeling well. Under good control on current regimen. Continue current regimen. Continue to monitor. Call with any concerns. Refills given. Will get labs and check BP ASAP

## 2018-10-11 NOTE — Assessment & Plan Note (Signed)
Under good control on current regimen. Continue current regimen. Continue to monitor. Call with any concerns. Refills given.   

## 2018-10-11 NOTE — Assessment & Plan Note (Signed)
Under good control on current regimen. Continue current regimen. Continue to monitor. Call with any concerns. Refills given. Will get labs ASAP

## 2018-10-15 ENCOUNTER — Other Ambulatory Visit: Payer: Medicare Other

## 2018-10-15 ENCOUNTER — Other Ambulatory Visit: Payer: Self-pay

## 2018-10-15 DIAGNOSIS — I129 Hypertensive chronic kidney disease with stage 1 through stage 4 chronic kidney disease, or unspecified chronic kidney disease: Secondary | ICD-10-CM

## 2018-10-15 DIAGNOSIS — E782 Mixed hyperlipidemia: Secondary | ICD-10-CM | POA: Diagnosis not present

## 2018-10-15 DIAGNOSIS — N183 Chronic kidney disease, stage 3 unspecified: Secondary | ICD-10-CM

## 2018-10-15 DIAGNOSIS — R8290 Unspecified abnormal findings in urine: Secondary | ICD-10-CM | POA: Diagnosis not present

## 2018-10-15 DIAGNOSIS — J449 Chronic obstructive pulmonary disease, unspecified: Secondary | ICD-10-CM

## 2018-10-16 ENCOUNTER — Telehealth: Payer: Self-pay | Admitting: Family Medicine

## 2018-10-16 LAB — COMPREHENSIVE METABOLIC PANEL
ALT: 19 IU/L (ref 0–32)
AST: 20 IU/L (ref 0–40)
Albumin/Globulin Ratio: 2 (ref 1.2–2.2)
Albumin: 4.3 g/dL (ref 3.8–4.8)
Alkaline Phosphatase: 93 IU/L (ref 39–117)
BUN/Creatinine Ratio: 16 (ref 12–28)
BUN: 17 mg/dL (ref 8–27)
Bilirubin Total: 0.3 mg/dL (ref 0.0–1.2)
CO2: 24 mmol/L (ref 20–29)
Calcium: 9.8 mg/dL (ref 8.7–10.3)
Chloride: 102 mmol/L (ref 96–106)
Creatinine, Ser: 1.05 mg/dL — ABNORMAL HIGH (ref 0.57–1.00)
GFR calc Af Amer: 64 mL/min/{1.73_m2} (ref >59)
GFR calc non Af Amer: 55 mL/min/{1.73_m2} — ABNORMAL LOW (ref >59)
Globulin, Total: 2.2 g/dL (ref 1.5–4.5)
Glucose: 105 mg/dL — ABNORMAL HIGH (ref 65–99)
Potassium: 4.5 mmol/L (ref 3.5–5.2)
Sodium: 140 mmol/L (ref 134–144)
Total Protein: 6.5 g/dL (ref 6.0–8.5)

## 2018-10-16 LAB — CBC WITH DIFFERENTIAL/PLATELET
Basophils Absolute: 0 10*3/uL (ref 0.0–0.2)
Basos: 1 %
EOS (ABSOLUTE): 0.3 10*3/uL (ref 0.0–0.4)
Eos: 5 %
Hematocrit: 44.3 % (ref 34.0–46.6)
Hemoglobin: 14.5 g/dL (ref 11.1–15.9)
Immature Grans (Abs): 0 10*3/uL (ref 0.0–0.1)
Immature Granulocytes: 0 %
Lymphocytes Absolute: 1.8 10*3/uL (ref 0.7–3.1)
Lymphs: 33 %
MCH: 29.1 pg (ref 26.6–33.0)
MCHC: 32.7 g/dL (ref 31.5–35.7)
MCV: 89 fL (ref 79–97)
Monocytes Absolute: 0.5 10*3/uL (ref 0.1–0.9)
Monocytes: 9 %
Neutrophils Absolute: 3 10*3/uL (ref 1.4–7.0)
Neutrophils: 52 %
Platelets: 249 10*3/uL (ref 150–450)
RBC: 4.99 x10E6/uL (ref 3.77–5.28)
RDW: 13 % (ref 11.7–15.4)
WBC: 5.6 10*3/uL (ref 3.4–10.8)

## 2018-10-16 LAB — TSH: TSH: 4.88 u[IU]/mL — ABNORMAL HIGH (ref 0.450–4.500)

## 2018-10-16 LAB — LIPID PANEL W/O CHOL/HDL RATIO
Cholesterol, Total: 171 mg/dL (ref 100–199)
HDL: 62 mg/dL (ref >39)
LDL Calculated: 95 mg/dL (ref 0–99)
Triglycerides: 69 mg/dL (ref 0–149)
VLDL Cholesterol Cal: 14 mg/dL (ref 5–40)

## 2018-10-16 NOTE — Telephone Encounter (Signed)
Patient notified

## 2018-10-16 NOTE — Telephone Encounter (Signed)
Please let her know that her labs came back nice and normal except her thyroid was off a bit. We'll recheck it next time she comes in. Everything else looks nice and normal. Thanks!

## 2018-10-17 LAB — MICROSCOPIC EXAMINATION

## 2018-10-17 LAB — URINE CULTURE, REFLEX

## 2018-10-17 LAB — UA/M W/RFLX CULTURE, ROUTINE
Bilirubin, UA: NEGATIVE
Glucose, UA: NEGATIVE
Ketones, UA: NEGATIVE
Nitrite, UA: NEGATIVE
Protein,UA: NEGATIVE
Specific Gravity, UA: 1.015 (ref 1.005–1.030)
Urobilinogen, Ur: 0.2 mg/dL (ref 0.2–1.0)
pH, UA: 5 (ref 5.0–7.5)

## 2018-10-17 LAB — MICROALBUMIN, URINE WAIVED
Creatinine, Urine Waived: 50 mg/dL (ref 10–300)
Microalb, Ur Waived: 10 mg/L (ref 0–19)

## 2019-01-04 ENCOUNTER — Other Ambulatory Visit: Payer: Self-pay | Admitting: Family Medicine

## 2019-01-04 DIAGNOSIS — Z1231 Encounter for screening mammogram for malignant neoplasm of breast: Secondary | ICD-10-CM

## 2019-02-18 ENCOUNTER — Ambulatory Visit (INDEPENDENT_AMBULATORY_CARE_PROVIDER_SITE_OTHER): Payer: Medicare Other

## 2019-02-18 ENCOUNTER — Other Ambulatory Visit: Payer: Self-pay

## 2019-02-18 DIAGNOSIS — Z23 Encounter for immunization: Secondary | ICD-10-CM | POA: Diagnosis not present

## 2019-02-20 ENCOUNTER — Other Ambulatory Visit: Payer: Self-pay

## 2019-02-20 ENCOUNTER — Encounter: Payer: Self-pay | Admitting: Family Medicine

## 2019-02-20 ENCOUNTER — Ambulatory Visit
Admission: RE | Admit: 2019-02-20 | Discharge: 2019-02-20 | Disposition: A | Discharge status: Home / Self Care | Payer: Medicare Other | Source: Ambulatory Visit | Attending: Family Medicine | Admitting: Family Medicine

## 2019-02-20 DIAGNOSIS — Z1231 Encounter for screening mammogram for malignant neoplasm of breast: Secondary | ICD-10-CM

## 2019-03-06 ENCOUNTER — Other Ambulatory Visit: Payer: Self-pay

## 2019-03-06 ENCOUNTER — Ambulatory Visit (INDEPENDENT_AMBULATORY_CARE_PROVIDER_SITE_OTHER): Payer: Medicare Other

## 2019-03-06 DIAGNOSIS — Z23 Encounter for immunization: Secondary | ICD-10-CM

## 2019-03-16 ENCOUNTER — Other Ambulatory Visit: Payer: Self-pay | Admitting: Family Medicine

## 2019-03-16 NOTE — Telephone Encounter (Signed)
Forwarding medication refill requesting to PCP for review.

## 2019-03-16 NOTE — Telephone Encounter (Signed)
For

## 2019-04-17 ENCOUNTER — Encounter: Payer: Self-pay | Admitting: Family Medicine

## 2019-04-22 ENCOUNTER — Other Ambulatory Visit: Payer: Self-pay

## 2019-04-23 ENCOUNTER — Other Ambulatory Visit: Payer: Self-pay

## 2019-04-23 ENCOUNTER — Ambulatory Visit (INDEPENDENT_AMBULATORY_CARE_PROVIDER_SITE_OTHER): Payer: Medicare Other | Admitting: Family Medicine

## 2019-04-23 ENCOUNTER — Encounter: Payer: Self-pay | Admitting: Family Medicine

## 2019-04-23 VITALS — BP 139/82 | HR 73 | Temp 98.6°F | Ht 62.17 in | Wt 149.1 lb

## 2019-04-23 DIAGNOSIS — I129 Hypertensive chronic kidney disease with stage 1 through stage 4 chronic kidney disease, or unspecified chronic kidney disease: Secondary | ICD-10-CM

## 2019-04-23 DIAGNOSIS — N183 Chronic kidney disease, stage 3 unspecified: Secondary | ICD-10-CM | POA: Diagnosis not present

## 2019-04-23 DIAGNOSIS — J449 Chronic obstructive pulmonary disease, unspecified: Secondary | ICD-10-CM

## 2019-04-23 DIAGNOSIS — E782 Mixed hyperlipidemia: Secondary | ICD-10-CM

## 2019-04-23 DIAGNOSIS — I2510 Atherosclerotic heart disease of native coronary artery without angina pectoris: Secondary | ICD-10-CM

## 2019-04-23 DIAGNOSIS — M816 Localized osteoporosis [Lequesne]: Secondary | ICD-10-CM

## 2019-04-23 DIAGNOSIS — Z0000 Encounter for general adult medical examination without abnormal findings: Secondary | ICD-10-CM

## 2019-04-23 LAB — UA/M W/RFLX CULTURE, ROUTINE
Bilirubin, UA: NEGATIVE
Glucose, UA: NEGATIVE
Ketones, UA: NEGATIVE
Leukocytes,UA: NEGATIVE
Nitrite, UA: NEGATIVE
Protein,UA: NEGATIVE
Specific Gravity, UA: 1.02 (ref 1.005–1.030)
Urobilinogen, Ur: 0.2 mg/dL (ref 0.2–1.0)
pH, UA: 5 (ref 5.0–7.5)

## 2019-04-23 LAB — MICROALBUMIN, URINE WAIVED
Creatinine, Urine Waived: 100 mg/dL (ref 10–300)
Microalb, Ur Waived: 10 mg/L (ref 0–19)
Microalb/Creat Ratio: <30 mg/g (ref <30)

## 2019-04-23 LAB — MICROSCOPIC EXAMINATION
Bacteria, UA: NONE SEEN
WBC, UA: NONE SEEN /hpf (ref 0–5)

## 2019-04-23 MED ORDER — OMEPRAZOLE 20 MG PO CPDR: 20.0000 mg | DELAYED_RELEASE_CAPSULE | Freq: Every day | ORAL | 3 refills | Status: DC

## 2019-04-23 MED ORDER — ATORVASTATIN CALCIUM 20 MG PO TABS: ORAL_TABLET | ORAL | 1 refills | Status: DC

## 2019-04-23 MED ORDER — LISINOPRIL 5 MG PO TABS: 5.0000 mg | ORAL_TABLET | Freq: Every day | ORAL | 1 refills | Status: DC

## 2019-04-23 NOTE — Progress Notes (Signed)
BP 139/82   Pulse 73   Temp 98.6 F (37 C)   Ht 5' 2.17" (1.579 m)   Wt 149 lb 2 oz (67.6 kg)   SpO2 95%   BMI 27.13 kg/m    Subjective:    Patient ID: Jasmine Church, female    DOB: 03-Jan-1951, 68 y.o.   MRN: [id number]  HPI: Jasmine Church is a 68 y.o. female presenting on 04/23/2019 for comprehensive medical examination. Current medical complaints include:  HYPERTENSION / HYPERLIPIDEMIA Satisfied with current treatment? yes Duration of hypertension: chronic BP monitoring frequency: not checking BP medication side effects: no Past BP meds: lisinopril Duration of hyperlipidemia: chronic Cholesterol medication side effects: no Cholesterol supplements: none Past cholesterol medications: atorvastatin Medication compliance: excellent compliance Aspirin: yes Recent stressors: no Recurrent headaches: no Visual changes: no Palpitations: no Dyspnea: no Chest pain: no Lower extremity edema: no Dizzy/lightheaded: no  COPD COPD status: controlled Satisfied with current treatment?: yes Oxygen use: no Dyspnea frequency: rarely Cough frequency: rarely Rescue inhaler frequency: never   Limitation of activity: no Productive cough: no Pneumovax: Up to Date Influenza: Up to Date  Menopausal Symptoms: yes  Functional Status Survey: Is the patient deaf or have difficulty hearing?: No Does the patient have difficulty seeing, even when wearing glasses/contacts?: No Does the patient have difficulty concentrating, remembering, or making decisions?: No Does the patient have difficulty walking or climbing stairs?: No Does the patient have difficulty dressing or bathing?: No Does the patient have difficulty doing errands alone such as visiting a doctor's office or shopping?: No  Fall Risk  04/23/2019 10/11/2018 04/12/2018 03/07/2017 03/02/2015  Falls in the past year? 0 0 0 No No  Number falls in past yr: 0 - - - -  Injury with Fall? 0 - - - -  Follow up - Falls evaluation completed  - - -    Depression Screen Depression screen [facility] 2/9 04/23/2019 03/07/2017 03/02/2016 03/02/2016 03/02/2015  Decreased Interest 0 0 0 0 0  Down, Depressed, Hopeless 0 0 0 0 0  PHQ - 2 Score 0 0 0 0 0    Past Medical History:  Past Medical History:  Diagnosis Date  . Arthritis   . CKD (chronic kidney disease) stage 3, GFR 30-59 ml/min   . Hyperlipidemia   . Hypertension   . Vaginal atrophy     Surgical History:  Past Surgical History:  Procedure Laterality Date  . HEMORROIDECTOMY    . HERNIA REPAIR      Medications:  Current Outpatient Medications on File Prior to Visit  Medication Sig  . aspirin EC 81 MG tablet Take 81 mg by mouth daily.  . Calcium Carbonate-Vitamin D (CALCIUM 500 + D) 500-125 MG-UNIT TABS Take by mouth daily. Patient takes it only 3 days a week.  . clobetasol cream (TEMOVATE) 4.85 % Apply 1 application topically 2 (two) times a week.  . polyethylene glycol powder (GLYCOLAX/MIRALAX) powder Take 17 g by mouth 3 (three) times daily as needed.   No current facility-administered medications on file prior to visit.     Allergies:  No Known Allergies  Social History:  Social History   Socioeconomic History  . Marital status: Married    Spouse name: Not on file  . Number of children: Not on file  . Years of education: Not on file  . Highest education level: Not on file  Occupational History  . Not on file  Social Needs  . Financial resource strain: Not on  file  . Food insecurity    Worry: Not on file    Inability: Not on file  . Transportation needs    Medical: Not on file    Non-medical: Not on file  Tobacco Use  . Smoking status: Former Smoker    Packs/day: 1.00    Years: 33.00    Pack years: 33.00    Types: Cigarettes    Quit date: 06/06/2004    Years since quitting: 14.8  . Smokeless tobacco: Never Used  Substance and Sexual Activity  . Alcohol use: No  . Drug use: No  . Sexual activity: Never  Lifestyle  . Physical activity    Days  per week: Not on file    Minutes per session: Not on file  . Stress: Not on file  Relationships  . Social Herbalist on phone: Not on file    Gets together: Not on file    Attends religious service: Not on file    Active member of club or organization: Not on file    Attends meetings of clubs or organizations: Not on file    Relationship status: Not on file  . Intimate partner violence    Fear of current or ex partner: Not on file    Emotionally abused: Not on file    Physically abused: Not on file    Forced sexual activity: Not on file  Other Topics Concern  . Not on file  Social History Narrative  . Not on file   Social History   Tobacco Use  Smoking Status Former Smoker  . Packs/day: 1.00  . Years: 33.00  . Pack years: 33.00  . Types: Cigarettes  . Quit date: 06/06/2004  . Years since quitting: 14.8  Smokeless Tobacco Never Used   Social History   Substance and Sexual Activity  Alcohol Use No    Family History:  Family History  Problem Relation Age of Onset  . Arthritis Mother   . Hyperlipidemia Mother   . Hypertension Mother   . Heart disease Father   . Hypertension Father   . Heart attack Father   . Cancer Sister        uterus  . Hypertension Sister   . Heart disease Brother   . Heart attack Brother   . Hypertension Brother   . Stroke Maternal Grandmother   . Heart attack Brother   . Hypertension Brother   . Diabetes Neg Hx   . Breast cancer Neg Hx     Past medical history, surgical history, medications, allergies, family history and social history reviewed with patient today and changes made to appropriate areas of the chart.   Review of Systems  Constitutional: Negative.   HENT: Negative.   Eyes: Negative.   Respiratory: Negative.   Cardiovascular: Negative.   Gastrointestinal: Positive for constipation. Negative for abdominal pain, blood in stool, diarrhea, heartburn, melena, nausea and vomiting.  Genitourinary: Negative.    Musculoskeletal: Positive for back pain. Negative for falls, joint pain, myalgias and neck pain.  Skin: Negative.   Neurological: Negative.   Endo/Heme/Allergies: Negative for environmental allergies and polydipsia. Bruises/bleeds easily.  Psychiatric/Behavioral: Negative.     All other ROS negative except what is listed above and in the HPI.      Objective:    BP 139/82   Pulse 73   Temp 98.6 F (37 C)   Ht 5' 2.17" (1.579 m)   Wt 149 lb 2 oz (67.6 kg)  SpO2 95%   BMI 27.13 kg/m   Wt Readings from Last 3 Encounters:  04/23/19 149 lb 2 oz (67.6 kg)  10/11/18 146 lb (66.2 kg)  06/07/18 147 lb 3.2 oz (66.8 kg)    Physical Exam Vitals signs and nursing note reviewed.  Constitutional:      General: She is not in acute distress.    Appearance: Normal appearance. She is not ill-appearing, toxic-appearing or diaphoretic.  HENT:     Head: Normocephalic and atraumatic.     Right Ear: Tympanic membrane, ear canal and external ear normal. There is no impacted cerumen.     Left Ear: Tympanic membrane, ear canal and external ear normal. There is no impacted cerumen.     Nose: Nose normal. No congestion or rhinorrhea.     Mouth/Throat:     Mouth: Mucous membranes are moist.     Pharynx: Oropharynx is clear. No oropharyngeal exudate or posterior oropharyngeal erythema.  Eyes:     General: No scleral icterus.       Right eye: No discharge.        Left eye: No discharge.     Extraocular Movements: Extraocular movements intact.     Conjunctiva/sclera: Conjunctivae normal.     Pupils: Pupils are equal, round, and reactive to light.  Neck:     Musculoskeletal: Normal range of motion and neck supple. No neck rigidity or muscular tenderness.     Vascular: No carotid bruit.  Cardiovascular:     Rate and Rhythm: Normal rate and regular rhythm.     Pulses: Normal pulses.     Heart sounds: No murmur. No friction rub. No gallop.   Pulmonary:     Effort: Pulmonary effort is normal. No  respiratory distress.     Breath sounds: Normal breath sounds. No stridor. No wheezing, rhonchi or rales.  Chest:     Chest wall: No tenderness.  Abdominal:     General: Abdomen is flat. Bowel sounds are normal. There is no distension.     Palpations: Abdomen is soft. There is no mass.     Tenderness: There is no abdominal tenderness. There is no right CVA tenderness, left CVA tenderness, guarding or rebound.     Hernia: No hernia is present.  Genitourinary:    Comments: Breast and pelvic exams deferred with shared decision making Musculoskeletal:        General: No swelling, tenderness, deformity or signs of injury.     Right lower leg: No edema.     Left lower leg: No edema.  Lymphadenopathy:     Cervical: No cervical adenopathy.  Skin:    General: Skin is warm and dry.     Capillary Refill: Capillary refill takes less than 2 seconds.     Coloration: Skin is not jaundiced or pale.     Findings: No bruising, erythema, lesion or rash.  Neurological:     General: No focal deficit present.     Mental Status: She is alert and oriented to person, place, and time. Mental status is at baseline.     Cranial Nerves: No cranial nerve deficit.     Sensory: No sensory deficit.     Motor: No weakness.     Coordination: Coordination normal.     Gait: Gait normal.     Deep Tendon Reflexes: Reflexes normal.  Psychiatric:        Mood and Affect: Mood normal.        Behavior: Behavior normal.  Thought Content: Thought content normal.        Judgment: Judgment normal.     6CIT Screen 04/23/2019 04/12/2018  What Year? 0 points 0 points  What month? 0 points 0 points  What time? 0 points 0 points  Count back from 20 0 points 0 points  Months in reverse 0 points 0 points  Repeat phrase 0 points 2 points  Total Score 0 2      Results for orders placed or performed in visit on 10/15/18  Microscopic Examination   URINE  Result Value Ref Range   WBC, UA 11-30 (A) 0 - 5 /hpf   RBC  0-2 0 - 2 /hpf   Epithelial Cells (non renal) 0-10 0 - 10 /hpf   Bacteria, UA Few (A) None seen/Few  Urine Culture, Reflex   URINE  Result Value Ref Range   Urine Culture, Routine Final report    Organism ID, Bacteria Comment   UA/M w/rflx Culture, Routine   Specimen: Urine   URINE  Result Value Ref Range   Specific Gravity, UA 1.015 1.005 - 1.030   pH, UA 5.0 5.0 - 7.5   Color, UA Yellow Yellow   Appearance Ur Clear Clear   Leukocytes,UA 3+ (A) Negative   Protein,UA Negative Negative/Trace   Glucose, UA Negative Negative   Ketones, UA Negative Negative   RBC, UA 2+ (A) Negative   Bilirubin, UA Negative Negative   Urobilinogen, Ur 0.2 0.2 - 1.0 mg/dL   Nitrite, UA Negative Negative   Microscopic Examination See below:    Urinalysis Reflex Comment   TSH  Result Value Ref Range   TSH 4.880 (H) 0.450 - 4.500 uIU/mL  CBC with Differential/Platelet  Result Value Ref Range   WBC 5.6 3.4 - 10.8 x10E3/uL   RBC 4.99 3.77 - 5.28 x10E6/uL   Hemoglobin 14.5 11.1 - 15.9 g/dL   Hematocrit 44.3 34.0 - 46.6 %   MCV 89 79 - 97 fL   MCH 29.1 26.6 - 33.0 pg   MCHC 32.7 31.5 - 35.7 g/dL   RDW 13.0 11.7 - 15.4 %   Platelets 249 150 - 450 x10E3/uL   Neutrophils 52 Not Estab. %   Lymphs 33 Not Estab. %   Monocytes 9 Not Estab. %   Eos 5 Not Estab. %   Basos 1 Not Estab. %   Neutrophils Absolute 3.0 1.4 - 7.0 x10E3/uL   Lymphocytes Absolute 1.8 0.7 - 3.1 x10E3/uL   Monocytes Absolute 0.5 0.1 - 0.9 x10E3/uL   EOS (ABSOLUTE) 0.3 0.0 - 0.4 x10E3/uL   Basophils Absolute 0.0 0.0 - 0.2 x10E3/uL   Immature Granulocytes 0 Not Estab. %   Immature Grans (Abs) 0.0 0.0 - 0.1 x10E3/uL  Microalbumin, Urine Waived  Result Value Ref Range   Microalb, Ur Waived 10 0 - 19 mg/L   Creatinine, Urine Waived 50 10 - 300 mg/dL   Microalb/Creat Ratio 30-300 (H) <30 mg/g  Lipid Panel w/o Chol/HDL Ratio  Result Value Ref Range   Cholesterol, Total 171 100 - 199 mg/dL   Triglycerides 69 0 - 149 mg/dL   HDL  62 >39 mg/dL   VLDL Cholesterol Cal 14 5 - 40 mg/dL   LDL Calculated 95 0 - 99 mg/dL  Comprehensive metabolic panel  Result Value Ref Range   Glucose 105 (H) 65 - 99 mg/dL   BUN 17 8 - 27 mg/dL   Creatinine, Ser 1.05 (H) 0.57 - 1.00 mg/dL   GFR calc  non Af Amer 55 (L) >59 mL/min/1.73   GFR calc Af Amer 64 >59 mL/min/1.73   BUN/Creatinine Ratio 16 12 - 28   Sodium 140 134 - 144 mmol/L   Potassium 4.5 3.5 - 5.2 mmol/L   Chloride 102 96 - 106 mmol/L   CO2 24 20 - 29 mmol/L   Calcium 9.8 8.7 - 10.3 mg/dL   Total Protein 6.5 6.0 - 8.5 g/dL   Albumin 4.3 3.8 - 4.8 g/dL   Globulin, Total 2.2 1.5 - 4.5 g/dL   Albumin/Globulin Ratio 2.0 1.2 - 2.2   Bilirubin Total 0.3 0.0 - 1.2 mg/dL   Alkaline Phosphatase 93 39 - 117 IU/L   AST 20 0 - 40 IU/L   ALT 19 0 - 32 IU/L      Assessment & Plan:   Problem List Items Addressed This Visit      Cardiovascular and Mediastinum   CAD (coronary artery disease)    Will keep BP and cholesterol under good control. Continue to monitor. Labs drawn today.      Relevant Medications   atorvastatin (LIPITOR) 20 MG tablet   lisinopril (ZESTRIL) 5 MG tablet   Other Relevant Orders   CBC with Differential OUT   Comp Met (CMET)   TSH   UA/M w/rflx Culture, Routine     Respiratory   COPD (chronic obstructive pulmonary disease) (HCC)    Stable off medicine. Continue to monitor. Call with any concerns.       Relevant Orders   CBC with Differential OUT   Comp Met (CMET)   TSH   UA/M w/rflx Culture, Routine     Musculoskeletal and Integument   Osteoporosis    Will check vitamin D. Refused medication. Recheck 2 years.       Relevant Orders   CBC with Differential OUT   Comp Met (CMET)   TSH   UA/M w/rflx Culture, Routine   Vit D  25 hydroxy (rtn osteoporosis monitoring)     Genitourinary   Benign hypertensive renal disease    Under good control on current regimen. Continue current regimen. Continue to monitor. Call with any concerns.  Refills given. Labs drawn today.        Relevant Orders   CBC with Differential OUT   Comp Met (CMET)   Microalbumin, Urine Waived   TSH   UA/M w/rflx Culture, Routine   CKD (chronic kidney disease) stage 3, GFR 30-59 ml/min    Rechecking labs today. Await results.       Relevant Orders   CBC with Differential OUT   Comp Met (CMET)   TSH   UA/M w/rflx Culture, Routine     Other   Hyperlipidemia    Under good control on current regimen. Continue current regimen. Continue to monitor. Call with any concerns. Refills given. Labs drawn today.        Relevant Medications   atorvastatin (LIPITOR) 20 MG tablet   lisinopril (ZESTRIL) 5 MG tablet   Other Relevant Orders   CBC with Differential OUT   Comp Met (CMET)   Lipid Panel w/o Chol/HDL Ratio OUT   TSH   UA/M w/rflx Culture, Routine    Other Visit Diagnoses    Wellness examination    -  Primary   Preventative care discussed today as below.    Routine general medical examination at a health care facility       Vaccines up to date. Screening labs checked today. Pap N/A. Mammogram, cologuard and  DEXA up to date. Continue diet and exercise. Call with any concerns.        Preventative Services:  Health Risk Assessment and Personalized Prevention Plan: Done today Bone Mass Measurements: up to date Breast Cancer Screening: up to date CVD Screening: done today Cervical Cancer Screening: N/A Colon Cancer Screening: up to date Depression Screening: done today Diabetes Screening: done today Glaucoma Screening: see your eye doctor Hepatitis B vaccine: N/A Hepatitis C screening: up to date HIV Screening: up to date Flu Vaccine: up to date Lung cancer Screening: up to date Obesity Screening: done today Pneumonia Vaccines (2): up to date STI Screening: N/A  Follow up plan: Return in about 6 months (around 10/21/2019).   LABORATORY TESTING:  - Pap smear: not applicable  IMMUNIZATIONS:   - Tdap: Tetanus vaccination  status reviewed: last tetanus booster within 10 years. - Influenza: Up to date - Pneumovax: Not applicable - Prevnar: Up to date  SCREENING: -Mammogram: Up to date  - Colonoscopy: Up to date  - Bone Density: Up to date   PATIENT COUNSELING:   Advised to take 1 mg of folate supplement per day if capable of pregnancy.   Sexuality: Discussed sexually transmitted diseases, partner selection, use of condoms, avoidance of unintended pregnancy  and contraceptive alternatives.   Advised to avoid cigarette smoking.  I discussed with the patient that most people either abstain from alcohol or drink within safe limits (<=14/week and <=4 drinks/occasion for males, <=7/weeks and <= 3 drinks/occasion for females) and that the risk for alcohol disorders and other health effects rises proportionally with the number of drinks per week and how often a drinker exceeds daily limits.  Discussed cessation/primary prevention of drug use and availability of treatment for abuse.   Diet: Encouraged to adjust caloric intake to maintain  or achieve ideal body weight, to reduce intake of dietary saturated fat and total fat, to limit sodium intake by avoiding high sodium foods and not adding table salt, and to maintain adequate dietary potassium and calcium preferably from fresh fruits, vegetables, and low-fat dairy products.    stressed the importance of regular exercise  Injury prevention: Discussed safety belts, safety helmets, smoke detector, smoking near bedding or upholstery.   Dental health: Discussed importance of regular tooth brushing, flossing, and dental visits.    NEXT PREVENTATIVE PHYSICAL DUE IN 1 YEAR. Return in about 6 months (around 10/21/2019).

## 2019-04-23 NOTE — Assessment & Plan Note (Signed)
Under good control on current regimen. Continue current regimen. Continue to monitor. Call with any concerns. Refills given. Labs drawn today.   

## 2019-04-23 NOTE — Assessment & Plan Note (Signed)
Will keep BP and cholesterol under good control. Continue to monitor. Labs drawn today.  ?

## 2019-04-23 NOTE — Assessment & Plan Note (Signed)
Will check vitamin D. Refused medication. Recheck 2 years.

## 2019-04-23 NOTE — Assessment & Plan Note (Signed)
Stable off medicine. Continue to monitor. Call with any concerns.

## 2019-04-23 NOTE — Assessment & Plan Note (Signed)
Rechecking labs today. Await results.  

## 2019-04-23 NOTE — Patient Instructions (Addendum)
Preventative Services:  Health Risk Assessment and Personalized Prevention Plan: Done today Bone Mass Measurements: up to date Breast Cancer Screening: up to date CVD Screening: done today Cervical Cancer Screening: N/A Colon Cancer Screening: up to date Depression Screening: done today Diabetes Screening: done today Glaucoma Screening: see your eye doctor Hepatitis B vaccine: N/A Hepatitis C screening: up to date HIV Screening: up to date Flu Vaccine: up to date Lung cancer Screening: up to date Obesity Screening: done today Pneumonia Vaccines (2): up to date STI Screening: [place] Maintenance After Age 31 After age 12, you are at a higher risk for certain long-term diseases and infections as well as injuries from falls. Falls are a major cause of broken bones and head injuries in people who are older than age 33. Getting regular preventive care can help to keep you healthy and well. Preventive care includes getting regular testing and making lifestyle changes as recommended by your health care provider. Talk with your health care provider about:  Which screenings and tests you should have. A screening is a test that checks for a disease when you have no symptoms.  A diet and exercise plan that is right for you. What should I know about screenings and tests to prevent falls? Screening and testing are the best ways to find a health problem early. Early diagnosis and treatment give you the best chance of managing medical conditions that are common after age 41. Certain conditions and lifestyle choices may make you more likely to have a fall. Your health care provider may recommend:  Regular vision checks. Poor vision and conditions such as cataracts can make you more likely to have a fall. If you wear glasses, make sure to get your prescription updated if your vision changes.  Medicine review. Work with your health care provider to regularly review all of the medicines you are  taking, including over-the-counter medicines. Ask your health care provider about any side effects that may make you more likely to have a fall. Tell your health care provider if any medicines that you take make you feel dizzy or sleepy.  Osteoporosis screening. Osteoporosis is a condition that causes the bones to get weaker. This can make the bones weak and cause them to break more easily.  Blood pressure screening. Blood pressure changes and medicines to control blood pressure can make you feel dizzy.  Strength and balance checks. Your health care provider may recommend certain tests to check your strength and balance while standing, walking, or changing positions.  Foot health exam. Foot pain and numbness, as well as not wearing proper footwear, can make you more likely to have a fall.  Depression screening. You may be more likely to have a fall if you have a fear of falling, feel emotionally low, or feel unable to do activities that you used to do.  Alcohol use screening. Using too much alcohol can affect your balance and may make you more likely to have a fall. What actions can I take to lower my risk of falls? General instructions  Talk with your health care provider about your risks for falling. Tell your health care provider if: ? You fall. Be sure to tell your health care provider about all falls, even ones that seem minor. ? You feel dizzy, sleepy, or off-balance.  Take over-the-counter and prescription medicines only as told by your health care provider. These include any supplements.  Eat a healthy diet and maintain a healthy weight. A  healthy diet includes low-fat dairy products, low-fat (lean) meats, and fiber from whole grains, beans, and lots of fruits and vegetables. Home safety  Remove any tripping hazards, such as rugs, cords, and clutter.  Install safety equipment such as grab bars in bathrooms and safety rails on stairs.  Keep rooms and walkways  well-lit. Activity   Follow a regular exercise program to stay fit. This will help you maintain your balance. Ask your health care provider what types of exercise are appropriate for you.  If you need a cane or walker, use it as recommended by your health care provider.  Wear supportive shoes that have nonskid soles. Lifestyle  Do not drink alcohol if your health care provider tells you not to drink.  If you drink alcohol, limit how much you have: ? 0-1 drink a day for women. ? 0-2 drinks a day for men.  Be aware of how much alcohol is in your drink. In the [place] one drink equals one typical bottle of beer (12 oz), one-half glass of wine (5 oz), or one shot of hard liquor (1 oz).  Do not use any products that contain nicotine or tobacco, such as cigarettes and e-cigarettes. If you need help quitting, ask your health care provider. Summary  Having a healthy lifestyle and getting preventive care can help to protect your health and wellness after age 13.  Screening and testing are the best way to find a health problem early and help you avoid having a fall. Early diagnosis and treatment give you the best chance for managing medical conditions that are more common for people who are older than age 72.  Falls are a major cause of broken bones and head injuries in people who are older than age 3. Take precautions to prevent a fall at home.  Work with your health care provider to learn what changes you can make to improve your health and wellness and to prevent falls. This information is not intended to replace advice given to you by your health care provider. Make sure you discuss any questions you have with your health care provider. Document Released: 04/05/2017 Document Revised: 09/13/2018 Document Reviewed: 04/05/2017 Elsevier Patient Education  2020 [employer].

## 2019-04-24 LAB — CBC WITH DIFFERENTIAL/PLATELET
Basophils Absolute: 0.1 10*3/uL (ref 0.0–0.2)
Basos: 1 %
EOS (ABSOLUTE): 0.2 10*3/uL (ref 0.0–0.4)
Eos: 4 %
Hematocrit: 44.6 % (ref 34.0–46.6)
Hemoglobin: 14.5 g/dL (ref 11.1–15.9)
Immature Grans (Abs): 0 10*3/uL (ref 0.0–0.1)
Immature Granulocytes: 0 %
Lymphocytes Absolute: 1.8 10*3/uL (ref 0.7–3.1)
Lymphs: 33 %
MCH: 27.9 pg (ref 26.6–33.0)
MCHC: 32.5 g/dL (ref 31.5–35.7)
MCV: 86 fL (ref 79–97)
Monocytes Absolute: 0.5 10*3/uL (ref 0.1–0.9)
Monocytes: 10 %
Neutrophils Absolute: 2.7 10*3/uL (ref 1.4–7.0)
Neutrophils: 52 %
Platelets: 257 10*3/uL (ref 150–450)
RBC: 5.2 x10E6/uL (ref 3.77–5.28)
RDW: 13.5 % (ref 11.7–15.4)
WBC: 5.3 10*3/uL (ref 3.4–10.8)

## 2019-04-24 LAB — LIPID PANEL W/O CHOL/HDL RATIO
Cholesterol, Total: 159 mg/dL (ref 100–199)
HDL: 60 mg/dL (ref >39)
LDL Chol Calc (NIH): 88 mg/dL (ref 0–99)
Triglycerides: 54 mg/dL (ref 0–149)
VLDL Cholesterol Cal: 11 mg/dL (ref 5–40)

## 2019-04-24 LAB — VITAMIN D 25 HYDROXY (VIT D DEFICIENCY, FRACTURES): Vit D, 25-Hydroxy: 38.1 ng/mL (ref 30.0–100.0)

## 2019-04-24 LAB — COMPREHENSIVE METABOLIC PANEL
ALT: 22 IU/L (ref 0–32)
AST: 21 IU/L (ref 0–40)
Albumin/Globulin Ratio: 1.7 (ref 1.2–2.2)
Albumin: 4.3 g/dL (ref 3.8–4.8)
Alkaline Phosphatase: 87 IU/L (ref 39–117)
BUN/Creatinine Ratio: 15 (ref 12–28)
BUN: 16 mg/dL (ref 8–27)
Bilirubin Total: 0.4 mg/dL (ref 0.0–1.2)
CO2: 23 mmol/L (ref 20–29)
Calcium: 9.7 mg/dL (ref 8.7–10.3)
Chloride: 102 mmol/L (ref 96–106)
Creatinine, Ser: 1.08 mg/dL — ABNORMAL HIGH (ref 0.57–1.00)
GFR calc Af Amer: 61 mL/min/{1.73_m2} (ref >59)
GFR calc non Af Amer: 53 mL/min/{1.73_m2} — ABNORMAL LOW (ref >59)
Globulin, Total: 2.6 g/dL (ref 1.5–4.5)
Glucose: 86 mg/dL (ref 65–99)
Potassium: 5.5 mmol/L — ABNORMAL HIGH (ref 3.5–5.2)
Sodium: 141 mmol/L (ref 134–144)
Total Protein: 6.9 g/dL (ref 6.0–8.5)

## 2019-04-24 LAB — TSH: TSH: 3.25 u[IU]/mL (ref 0.450–4.500)

## 2019-04-28 ENCOUNTER — Encounter: Payer: Self-pay | Admitting: Family Medicine

## 2019-05-01 ENCOUNTER — Telehealth: Payer: Self-pay

## 2019-05-01 NOTE — Telephone Encounter (Signed)
Attempted to call pt to inform her that it is time for her annual lung cancer screening. Unable to leave voicemail due to there not being a voicemail option.

## 2019-05-15 ENCOUNTER — Encounter: Payer: Self-pay | Admitting: *Deleted

## 2019-05-15 NOTE — Telephone Encounter (Signed)
Attempted to contact to schedule lung screening scan. However, the phone is disconnected as soon as answered on 2 attempts. Will mail notification.

## 2019-07-05 ENCOUNTER — Inpatient Hospital Stay: Payer: Medicare Other

## 2019-07-05 ENCOUNTER — Telehealth: Payer: Self-pay | Admitting: *Deleted

## 2019-07-05 ENCOUNTER — Other Ambulatory Visit: Payer: Self-pay

## 2019-07-05 ENCOUNTER — Ambulatory Visit
Admission: RE | Admit: 2019-07-05 | Discharge: 2019-07-05 | Disposition: A | Discharge status: Home / Self Care | Payer: Medicare Other | Source: Ambulatory Visit | Attending: Oncology | Admitting: Oncology

## 2019-07-05 DIAGNOSIS — Z87891 Personal history of nicotine dependence: Secondary | ICD-10-CM

## 2019-07-05 NOTE — Telephone Encounter (Signed)
Patient has been notified that annual lung cancer screening low dose CT scan is due currently or will be in near future. Confirmed that patient is within the age range of 55-77, and asymptomatic, (no signs or symptoms of lung cancer). Patient denies illness that would prevent curative treatment for lung cancer if found. Verified smoking history, (former, quit 2006, 33 pack year). The shared decision making visit was done 05/14/18. Patient is agreeable for CT scan being scheduled.

## 2019-07-10 ENCOUNTER — Encounter: Payer: Self-pay | Admitting: *Deleted

## 2019-08-05 ENCOUNTER — Ambulatory Visit (INDEPENDENT_AMBULATORY_CARE_PROVIDER_SITE_OTHER): Payer: Medicare Other

## 2019-08-05 VITALS — Ht 62.7 in | Wt 150.0 lb

## 2019-08-05 DIAGNOSIS — Z0000 Encounter for general adult medical examination without abnormal findings: Secondary | ICD-10-CM

## 2019-08-05 NOTE — Patient Instructions (Signed)
Jasmine Church , Thank you for taking time to come for your Medicare Wellness Visit. I appreciate your ongoing commitment to your health goals. Please review the following plan we discussed and let me know if I can assist you in the future.   Screening recommendations/referrals: Colonoscopy: completed cologuard  Mammogram: completed 02/2019 Bone Density: completed 2019 Recommended yearly ophthalmology/optometry visit for glaucoma screening and checkup Recommended yearly dental visit for hygiene and checkup  Vaccinations: Influenza vaccine: up to date  Pneumococcal vaccine: up to date  Tdap vaccine: up to date  Shingles vaccine: shingrix eligible    Covid-19: declined   Advanced directives: Advance directive discussed with you today. Even though you declined this today please call our office should you change your mind and we can give you the proper paperwork for you to fill out.  Conditions/risks identified: none   Next appointment: Follow up in one year for your annual wellness visit.    Preventive Care 31 Years and Older, Female Preventive care refers to lifestyle choices and visits with your health care provider that can promote health and wellness. What does preventive care include?  A yearly physical exam. This is also called an annual well check.  Dental exams once or twice a year.  Routine eye exams. Ask your health care provider how often you should have your eyes checked.  Personal lifestyle choices, including:  Daily care of your teeth and gums.  Regular physical activity.  Eating a healthy diet.  Avoiding tobacco and drug use.  Limiting alcohol use.  Practicing safe sex.  Taking low-dose aspirin every day.  Taking vitamin and mineral supplements as recommended by your health care provider. What happens during an annual well check? The services and screenings done by your health care provider during your annual well check will depend on your age, overall  health, lifestyle risk factors, and family history of disease. Counseling  Your health care provider may ask you questions about your:  Alcohol use.  Tobacco use.  Drug use.  Emotional well-being.  Home and relationship well-being.  Sexual activity.  Eating habits.  History of falls.  Memory and ability to understand (cognition).  Work and work Statistician.  Reproductive health. Screening  You may have the following tests or measurements:  Height, weight, and BMI.  Blood pressure.  Lipid and cholesterol levels. These may be checked every 5 years, or more frequently if you are over 5 years old.  Skin check.  Lung cancer screening. You may have this screening every year starting at age 62 if you have a 30-pack-year history of smoking and currently smoke or have quit within the past 15 years.  Fecal occult blood test (FOBT) of the stool. You may have this test every year starting at age 58.  Flexible sigmoidoscopy or colonoscopy. You may have a sigmoidoscopy every 5 years or a colonoscopy every 10 years starting at age 32.  Hepatitis C blood test.  Hepatitis B blood test.  Sexually transmitted disease (STD) testing.  Diabetes screening. This is done by checking your blood sugar (glucose) after you have not eaten for a while (fasting). You may have this done every 1-3 years.  Bone density scan. This is done to screen for osteoporosis. You may have this done starting at age 18.  Mammogram. This may be done every 1-2 years. Talk to your health care provider about how often you should have regular mammograms. Talk with your health care provider about your test results, treatment options, and  if necessary, the need for more tests. Vaccines  Your health care provider may recommend certain vaccines, such as:  Influenza vaccine. This is recommended every year.  Tetanus, diphtheria, and acellular pertussis (Tdap, Td) vaccine. You may need a Td booster every 10  years.  Zoster vaccine. You may need this after age 52.  Pneumococcal 13-valent conjugate (PCV13) vaccine. One dose is recommended after age 82.  Pneumococcal polysaccharide (PPSV23) vaccine. One dose is recommended after age 40. Talk to your health care provider about which screenings and vaccines you need and how often you need them. This information is not intended to replace advice given to you by your health care provider. Make sure you discuss any questions you have with your health care provider. Document Released: 06/19/2015 Document Revised: 02/10/2016 Document Reviewed: 03/24/2015 Elsevier Interactive Patient Education  2017 [place] Prevention in the Home Falls can cause injuries. They can happen to people of all ages. There are many things you can do to make your home safe and to help prevent falls. What can I do on the outside of my home?  Regularly fix the edges of walkways and driveways and fix any cracks.  Remove anything that might make you trip as you walk through a door, such as a raised step or threshold.  Trim any bushes or trees on the path to your home.  Use bright outdoor lighting.  Clear any walking paths of anything that might make someone trip, such as rocks or tools.  Regularly check to see if handrails are loose or broken. Make sure that both sides of any steps have handrails.  Any raised decks and porches should have guardrails on the edges.  Have any leaves, snow, or ice cleared regularly.  Use sand or salt on walking paths during winter.  Clean up any spills in your garage right away. This includes oil or grease spills. What can I do in the bathroom?  Use night lights.  Install grab bars by the toilet and in the tub and shower. Do not use towel bars as grab bars.  Use non-skid mats or decals in the tub or shower.  If you need to sit down in the shower, use a plastic, non-slip stool.  Keep the floor dry. Clean up any water that  spills on the floor as soon as it happens.  Remove soap buildup in the tub or shower regularly.  Attach bath mats securely with double-sided non-slip rug tape.  Do not have throw rugs and other things on the floor that can make you trip. What can I do in the bedroom?  Use night lights.  Make sure that you have a light by your bed that is easy to reach.  Do not use any sheets or blankets that are too big for your bed. They should not hang down onto the floor.  Have a firm chair that has side arms. You can use this for support while you get dressed.  Do not have throw rugs and other things on the floor that can make you trip. What can I do in the kitchen?  Clean up any spills right away.  Avoid walking on wet floors.  Keep items that you use a lot in easy-to-reach places.  If you need to reach something above you, use a strong step stool that has a grab bar.  Keep electrical cords out of the way.  Do not use floor polish or wax that makes floors slippery. If you  must use wax, use non-skid floor wax.  Do not have throw rugs and other things on the floor that can make you trip. What can I do with my stairs?  Do not leave any items on the stairs.  Make sure that there are handrails on both sides of the stairs and use them. Fix handrails that are broken or loose. Make sure that handrails are as long as the stairways.  Check any carpeting to make sure that it is firmly attached to the stairs. Fix any carpet that is loose or worn.  Avoid having throw rugs at the top or bottom of the stairs. If you do have throw rugs, attach them to the floor with carpet tape.  Make sure that you have a light switch at the top of the stairs and the bottom of the stairs. If you do not have them, ask someone to add them for you. What else can I do to help prevent falls?  Wear shoes that:  Do not have high heels.  Have rubber bottoms.  Are comfortable and fit you well.  Are closed at the  toe. Do not wear sandals.  If you use a stepladder:  Make sure that it is fully opened. Do not climb a closed stepladder.  Make sure that both sides of the stepladder are locked into place.  Ask someone to hold it for you, if possible.  Clearly mark and make sure that you can see:  Any grab bars or handrails.  First and last steps.  Where the edge of each step is.  Use tools that help you move around (mobility aids) if they are needed. These include:  Canes.  Walkers.  Scooters.  Crutches.  Turn on the lights when you go into a dark area. Replace any light bulbs as soon as they burn out.  Set up your furniture so you have a clear path. Avoid moving your furniture around.  If any of your floors are uneven, fix them.  If there are any pets around you, be aware of where they are.  Review your medicines with your doctor. Some medicines can make you feel dizzy. This can increase your chance of falling. Ask your doctor what other things that you can do to help prevent falls. This information is not intended to replace advice given to you by your health care provider. Make sure you discuss any questions you have with your health care provider. Document Released: 03/19/2009 Document Revised: 10/29/2015 Document Reviewed: 06/27/2014 Elsevier Interactive Patient Education  2017 [employer].

## 2019-08-05 NOTE — Progress Notes (Signed)
Subjective:   Jasmine Church is a 69 y.o. female who presents for Medicare Annual (Subsequent) preventive examination.  This visit is being conducted via phone call  - after an attmept to do on video chat - due to the COVID-19 pandemic. This patient has given me verbal consent via phone to conduct this visit, patient states they are participating from their home address. Some vital signs may be absent or patient reported.   Patient identification: identified by name, DOB, and current address.    Review of Systems:   Cardiac Risk Factors include: advanced age (>70men, >34 women);dyslipidemia;hypertension     Objective:     Vitals: Ht 5' 2.7" (1.593 m)   Wt 150 lb (68 kg)   BMI 26.83 kg/m   Body mass index is 26.83 kg/m.  Advanced Directives 08/05/2019  Does Patient Have a Medical Advance Directive? No    Tobacco Social History   Tobacco Use  Smoking Status Former Smoker  . Packs/day: 1.00  . Years: 33.00  . Pack years: 33.00  . Types: Cigarettes  . Quit date: 06/06/2004  . Years since quitting: 15.1  Smokeless Tobacco Never Used     Counseling given: Not Answered   Clinical Intake:  Pre-visit preparation completed: No  Pain : No/denies pain     Nutritional Risks: None Diabetes: No  How often do you need to have someone help you when you read instructions, pamphlets, or other written materials from your doctor or pharmacy?: 1 - Never  Interpreter Needed?: No  Information entered by :: Jasmine Humphrey,LPN  Past Medical History:  Diagnosis Date  . Arthritis   . CKD (chronic kidney disease) stage 3, GFR 30-59 ml/min   . Hyperlipidemia   . Hypertension   . Vaginal atrophy    Past Surgical History:  Procedure Laterality Date  . HEMORROIDECTOMY    . HERNIA REPAIR     Family History  Problem Relation Age of Onset  . Arthritis Mother   . Hyperlipidemia Mother   . Hypertension Mother   . Heart disease Father   . Hypertension Father   . Heart attack  Father   . Cancer Sister        uterus  . Hypertension Sister   . Heart disease Brother   . Heart attack Brother   . Hypertension Brother   . Stroke Maternal Grandmother   . Heart attack Brother   . Hypertension Brother   . Diabetes Neg Hx   . Breast cancer Neg Hx    Social History   Socioeconomic History  . Marital status: Married    Spouse name: Not on file  . Number of children: Not on file  . Years of education: Not on file  . Highest education level: Not on file  Occupational History  . Not on file  Tobacco Use  . Smoking status: Former Smoker    Packs/day: 1.00    Years: 33.00    Pack years: 33.00    Types: Cigarettes    Quit date: 06/06/2004    Years since quitting: 15.1  . Smokeless tobacco: Never Used  Substance and Sexual Activity  . Alcohol use: No  . Drug use: No  . Sexual activity: Never  Other Topics Concern  . Not on file  Social History Narrative  . Not on file   Social Determinants of Health   Financial Resource Strain:   . Difficulty of Paying Living Expenses: Not on file  Food Insecurity:   .  Worried About Charity fundraiser in the Last Year: Not on file  . Ran Out of Food in the Last Year: Not on file  Transportation Needs:   . Lack of Transportation (Medical): Not on file  . Lack of Transportation (Non-Medical): Not on file  Physical Activity:   . Days of Exercise per Week: Not on file  . Minutes of Exercise per Session: Not on file  Stress:   . Feeling of Stress : Not on file  Social Connections:   . Frequency of Communication with Friends and Family: Not on file  . Frequency of Social Gatherings with Friends and Family: Not on file  . Attends Religious Services: Not on file  . Active Member of Clubs or Organizations: Not on file  . Attends Archivist Meetings: Not on file  . Marital Status: Not on file    Outpatient Encounter Medications as of 08/05/2019  Medication Sig  . aspirin EC 81 MG tablet Take 81 mg by mouth  daily.  Marland Kitchen atorvastatin (LIPITOR) 20 MG tablet TAKE 1 TABLET BY MOUTH AT  BEDTIME  . Calcium Carbonate-Vitamin D (CALCIUM 500 + D) 500-125 MG-UNIT TABS Take by mouth daily. Patient takes it only 3 days a week.  Marland Kitchen lisinopril (ZESTRIL) 5 MG tablet Take 1 tablet (5 mg total) by mouth daily.  Marland Kitchen omeprazole (PRILOSEC) 20 MG capsule Take 1 capsule (20 mg total) by mouth daily.  . polyethylene glycol powder (GLYCOLAX/MIRALAX) powder Take 17 g by mouth 3 (three) times daily as needed.  . clobetasol cream (TEMOVATE) [id number] % Apply 1 application topically 2 (two) times a week. (Patient not taking: Reported on 08/05/2019)   No facility-administered encounter medications on file as of 08/05/2019.    Activities of Daily Living In your present state of health, do you have any difficulty performing the following activities: 08/05/2019 04/23/2019  Hearing? N N  Comment no hearing aids -  Vision? N N  Comment reading glasses, no eye dr currently. went to dr.woodard previously -  Difficulty concentrating or making decisions? N N  Walking or climbing stairs? N N  Dressing or bathing? N N  Doing errands, shopping? N N  Preparing Food and eating ? N -  Using the Toilet? N -  In the past six months, have you accidently leaked urine? N -  Do you have problems with loss of bowel control? N -  Managing your Medications? N -  Managing your Finances? N -  Housekeeping or managing your Housekeeping? N -  Some recent data might be hidden    Patient Care Team: Jasmine Roys, DO as PCP - General (Family Medicine)    Assessment:   This is a routine wellness examination for Jasmine Church.  Exercise Activities and Dietary recommendations Current Exercise Habits: The patient does not participate in regular exercise at present, Exercise limited by: None identified  Goals Addressed   None     Fall Risk: Fall Risk  08/05/2019 04/23/2019 10/11/2018 04/12/2018 03/07/2017  Falls in the past year? 1 0 0 0 No  Number falls in past  yr: 0 0 - - -  Injury with Fall? 1 0 - - -  Follow up - - Falls evaluation completed - -    FALL RISK PREVENTION PERTAINING TO THE HOME:  Any stairs in or around the home? Yes  If so, are there any without handrails? No   Home free of loose throw rugs in walkways, pet beds, electrical cords, etc?  Yes  Adequate lighting in your home to reduce risk of falls? Yes   ASSISTIVE DEVICES UTILIZED TO PREVENT FALLS:  Life alert? No  Use of a cane, walker or w/c? No  Grab bars in the bathroom? No  Shower chair or bench in shower? No  Elevated toilet seat or a handicapped toilet? No   DME ORDERS:  DME order needed?  No   TIMED UP AND GO:  Unable to perform    Depression Screen PHQ 2/9 Scores 08/05/2019 04/23/2019 03/07/2017 03/02/2016  PHQ - 2 Score 0 0 0 0     Cognitive Function     6CIT Screen 04/23/2019 04/12/2018  What Year? 0 points 0 points  What month? 0 points 0 points  What time? 0 points 0 points  Count back from 20 0 points 0 points  Months in reverse 0 points 0 points  Repeat phrase 0 points 2 points  Total Score 0 2    Immunization History  Administered Date(s) Administered  . Fluad Quad(high Dose 65+) 02/18/2019  . Influenza, High Dose Seasonal PF 03/12/2018  . Influenza,inj,Quad PF,6+ Mos 04/21/2016, 03/07/2017  . Pneumococcal Conjugate-13 03/06/2019  . Tdap 03/02/2016    Qualifies for Shingles Vaccine? Yes  Zostavax completed n/a. Due for Shingrix. Education has been provided regarding the importance of this vaccine. Pt has been advised to call insurance company to determine out of pocket expense. Advised may also receive vaccine at local pharmacy or Health Dept. Verbalized acceptance and understanding.  Tdap: up to date   Flu Vaccine: up to date   Pneumococcal Vaccine: due for pneumococcal 23 in 02/2020.   Covid-19 Vaccine:  Information provided.  Declined   Screening Tests Health Maintenance  Topic Date Due  . PNA vac Low Risk Adult (2 of 2 -  PPSV23) 03/05/2020  . MAMMOGRAM  02/19/2021  . Fecal DNA (Cologuard)  04/12/2021  . DEXA SCAN  06/05/2021  . TETANUS/TDAP  03/02/2026  . INFLUENZA VACCINE  Completed  . Hepatitis C Screening  Completed    Cancer Screenings:  Colorectal Screening: Completed cologuard 04/12/2018. Repeat every 3 years Mammogram: Completed 02/20/2019.  Bone Density: Completed 06/05/2018  Lung Cancer Screening: (Low Dose CT Chest recommended if Age 33-80 years, 30 pack-year currently smoking OR have quit w/in 15years.) does qualify.   Completed 07/05/2019  Additional Screening:  Hepatitis C Screening: does qualify; Completed 2018  Vision Screening: Recommended annual ophthalmology exams for early detection of glaucoma and other disorders of the eye. Is the patient up to date with their annual eye exam?  Yes  Who is the provider or what is the name of the office in which the pt attends annual eye exams? Dr.Woodard   Dental Screening: Recommended annual dental exams for proper oral hygiene  Community Resource Referral:  CRR required this visit?  No       Plan:  I have personally reviewed and addressed the Medicare Annual Wellness questionnaire and have noted the following in the patient's chart:  A. Medical and social history B. Use of alcohol, tobacco or illicit drugs  C. Current medications and supplements D. Functional ability and status E.  Nutritional status F.  Physical activity G. Advance directives H. List of other physicians I.  Hospitalizations, surgeries, and ER visits in previous 12 months J.  [place] such as hearing and vision if needed, cognitive and depression L. Referrals and appointments   In addition, I have reviewed and discussed with patient certain preventive protocols, quality  metrics, and best practice recommendations. A written personalized care plan for preventive services as well as general preventive health recommendations were provided to  patient.  Signed,    Bevelyn Ngo, LPN  [id number] Nurse Health Advisor   Nurse Notes: none

## 2019-08-16 ENCOUNTER — Other Ambulatory Visit: Payer: Self-pay | Admitting: Family Medicine

## 2019-08-16 NOTE — Telephone Encounter (Signed)
Requested Prescriptions  Pending Prescriptions Disp Refills  . omeprazole (PRILOSEC) 20 MG capsule [Pharmacy Med Name: OMEPRAZOLE 20MG  CAPSULES] 90 capsule 0    Sig: TAKE 1 CAPSULE BY MOUTH DAILY     Gastroenterology: Proton Pump Inhibitors Passed - 08/16/2019 10:28 AM      Passed - Valid encounter within last 12 months    Recent Outpatient Visits          3 months ago Wellness examination   [place], Megan P, DO   10 months ago Benign hypertensive renal disease   [facility] [place], Megan P, DO   1 year ago Herpes zoster without complication   [place], Megan P, DO   1 year ago [employer] [facility] [employer] preventive visit   [place], [place], DO   1 year ago Lichen sclerosus et atrophicus   [place], [place], DO      Future Appointments            In 2 months Wynetta Emery, Barb Merino, DO [place], [place]   In 22 months  [employer], [place]

## 2019-09-27 ENCOUNTER — Other Ambulatory Visit: Payer: Self-pay | Admitting: Family Medicine

## 2019-11-01 ENCOUNTER — Ambulatory Visit (INDEPENDENT_AMBULATORY_CARE_PROVIDER_SITE_OTHER): Payer: Medicare Other | Admitting: Family Medicine

## 2019-11-01 ENCOUNTER — Other Ambulatory Visit: Payer: Self-pay

## 2019-11-01 ENCOUNTER — Encounter: Payer: Self-pay | Admitting: Family Medicine

## 2019-11-01 VITALS — BP 172/84 | HR 66 | Temp 97.5°F | Ht 61.22 in | Wt 153.8 lb

## 2019-11-01 DIAGNOSIS — I129 Hypertensive chronic kidney disease with stage 1 through stage 4 chronic kidney disease, or unspecified chronic kidney disease: Secondary | ICD-10-CM | POA: Diagnosis not present

## 2019-11-01 DIAGNOSIS — J449 Chronic obstructive pulmonary disease, unspecified: Secondary | ICD-10-CM | POA: Diagnosis not present

## 2019-11-01 DIAGNOSIS — E782 Mixed hyperlipidemia: Secondary | ICD-10-CM

## 2019-11-01 DIAGNOSIS — D229 Melanocytic nevi, unspecified: Secondary | ICD-10-CM

## 2019-11-01 DIAGNOSIS — N183 Chronic kidney disease, stage 3 unspecified: Secondary | ICD-10-CM

## 2019-11-01 DIAGNOSIS — I2510 Atherosclerotic heart disease of native coronary artery without angina pectoris: Secondary | ICD-10-CM

## 2019-11-01 DIAGNOSIS — N3946 Mixed incontinence: Secondary | ICD-10-CM

## 2019-11-01 LAB — MICROALBUMIN, URINE WAIVED
Creatinine, Urine Waived: 100 mg/dL (ref 10–300)
Microalb, Ur Waived: 10 mg/L (ref 0–19)
Microalb/Creat Ratio: <30 mg/g (ref <30)

## 2019-11-01 MED ORDER — OMEPRAZOLE 20 MG PO CPDR: 20.0000 mg | DELAYED_RELEASE_CAPSULE | Freq: Every day | ORAL | 1 refills | Status: DC

## 2019-11-01 MED ORDER — ATORVASTATIN CALCIUM 20 MG PO TABS: 20.0000 mg | ORAL_TABLET | Freq: Every day | ORAL | 1 refills | Status: DC

## 2019-11-01 MED ORDER — LISINOPRIL 10 MG PO TABS: 10.0000 mg | ORAL_TABLET | Freq: Every day | ORAL | 1 refills | Status: DC

## 2019-11-01 NOTE — Progress Notes (Signed)
BP (!) 172/84 (BP Location: Left Arm, Cuff Size: Normal)   Pulse 66   Temp (!) 97.5 F (36.4 C) (Oral)   Ht 5' 1.22" (1.555 m)   Wt 153 lb 12.8 oz (69.8 kg)   SpO2 97%   BMI 28.85 kg/m    Subjective:    Patient ID: Jasmine Church, female    DOB: [birth year], 69 y.o.   MRN: [id number]  HPI: Jasmine Church is a 69 y.o. female  Chief Complaint  Patient presents with  . Hypertension  . Hyperlipidemia  . Skin Problem    FOREHEAD  . Leg Injury   HYPERTENSION / HYPERLIPIDEMIA Satisfied with current treatment? no Duration of hypertension: chronic BP monitoring frequency: not checking BP medication side effects: no Past BP meds: lisinopril Duration of hyperlipidemia: chronic Cholesterol medication side effects: no Cholesterol supplements: none Past cholesterol medications: atorvastatin Medication compliance: excellent compliance Aspirin: yes Recent stressors: no Recurrent headaches: no Visual changes: no Palpitations: no Dyspnea: no Chest pain: no Lower extremity edema: no Dizzy/lightheaded: no   URINARY SYMPTOMS- has been having accidents. 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 She notes that she has no control on her urine and also wets when she coughs.  Duration: Months Dysuria: no Urinary frequency: yes Urgency: yes Small volume voids: no Symptom severity: moderate Urinary incontinence: yes Foul odor: no Hematuria: no Abdominal pain: no Back pain: no Suprapubic pain/pressure: no Flank pain: no Fever:  no Vomiting: no Relief with cranberry juice: no Relief with pyridium: no Status:stable Previous urinary tract infection: no Recurrent urinary tract infection: no History of sexually transmitted disease: no Vaginal discharge: no Treatments attempted: none    Relevant past medical, surgical, family and social history reviewed and updated as indicated. Interim medical  history since our last visit reviewed. Allergies and medications reviewed and updated.  Review of Systems  Constitutional: Negative.   HENT: Negative.   Respiratory: Negative.   Cardiovascular: Negative.   Gastrointestinal: Positive for abdominal pain (about 3 months ago. Gone now). Negative for abdominal distention, anal bleeding, blood in stool, constipation, diarrhea, nausea, rectal pain and vomiting.  Musculoskeletal: Negative.   Neurological: Negative.   Psychiatric/Behavioral: Negative.     Per HPI unless specifically indicated above     Objective:    BP (!) 172/84 (BP Location: Left Arm, Cuff Size: Normal)   Pulse 66   Temp (!) 97.5 F (36.4 C) (Oral)   Ht 5' 1.22" (1.555 m)   Wt 153 lb 12.8 oz (69.8 kg)   SpO2 97%   BMI 28.85 kg/m   Wt Readings from Last 3 Encounters:  11/01/19 153 lb 12.8 oz (69.8 kg)  08/05/19 150 lb (68 kg)  07/05/19 150 lb (68 kg)    Physical Exam Vitals and nursing note reviewed.  Constitutional:      General: She is not in acute distress.    Appearance: Normal appearance. She is not ill-appearing, toxic-appearing or diaphoretic.  HENT:     Head: Normocephalic and atraumatic.     Right Ear: External ear normal.     Left Ear: External ear normal.     Nose: Nose normal.     Mouth/Throat:     Mouth: Mucous membranes are moist.     Pharynx: Oropharynx is clear.  Eyes:     General: No scleral icterus.       Right eye: No discharge.  Left eye: No discharge.     Extraocular Movements: Extraocular movements intact.     Conjunctiva/sclera: Conjunctivae normal.     Pupils: Pupils are equal, round, and reactive to light.  Cardiovascular:     Rate and Rhythm: Normal rate and regular rhythm.     Pulses: Normal pulses.     Heart sounds: Normal heart sounds. No murmur. No friction rub. No gallop.   Pulmonary:     Effort: Pulmonary effort is normal. No respiratory distress.     Breath sounds: Normal breath sounds. No stridor. No wheezing,  rhonchi or rales.  Chest:     Chest wall: No tenderness.  Musculoskeletal:        General: Normal range of motion.     Cervical back: Normal range of motion and neck supple.  Skin:    General: Skin is warm and dry.     Capillary Refill: Capillary refill takes less than 2 seconds.     Coloration: Skin is not jaundiced or pale.     Findings: No bruising, erythema, lesion or rash.     Comments: Numerous moles  Neurological:     General: No focal deficit present.     Mental Status: She is alert and oriented to person, place, and time. Mental status is at baseline.  Psychiatric:        Mood and Affect: Mood normal.        Behavior: Behavior normal.        Thought Content: Thought content normal.        Judgment: Judgment normal.     Results for orders placed or performed in visit on 11/01/19  Microalbumin, Urine Waived  Result Value Ref Range   Microalb, Ur Waived 10 0 - 19 mg/L   Creatinine, Urine Waived 100 10 - 300 mg/dL   Microalb/Creat Ratio <30 <30 mg/g      Assessment & Plan:   Problem List Items Addressed This Visit      Cardiovascular and Mediastinum   CAD (coronary artery disease) - Primary    Will keep BP and cholesterol under good control. Continue to monitor. Call with any concerns.       Relevant Medications   lisinopril (ZESTRIL) 10 MG tablet   atorvastatin (LIPITOR) 20 MG tablet   Other Relevant Orders   CBC with Differential/Platelet   Comprehensive metabolic panel     Respiratory   COPD (chronic obstructive pulmonary disease) ([place])    Under good control on current regimen. Continue current regimen. Continue to monitor. Call with any concerns. Refills given.        Relevant Orders   CBC with Differential/Platelet   Comprehensive metabolic panel     Genitourinary   Benign hypertensive renal disease    Not under good control. Will increase her lisinopril to 10mg  and recheck 1 month. Call with any concerns.       Relevant Orders   Comprehensive  metabolic panel   Microalbumin, Urine Waived (Completed)   CKD (chronic kidney disease) stage 3, GFR 30-59 ml/min    Checking labs today. Await results. Treat as needed.       Relevant Orders   Comprehensive metabolic panel     Other   Hyperlipidemia    Under good control on current regimen. Continue current regimen. Continue to monitor. Call with any concerns. Refills given. Labs drawn today.       Relevant Medications   lisinopril (ZESTRIL) 10 MG tablet   atorvastatin (LIPITOR) 20 MG  tablet   Other Relevant Orders   Comprehensive metabolic panel   Lipid Panel w/o Chol/HDL Ratio   Mixed stress and urge urinary incontinence    Referral to urology made today. Call with any concerns. Continue to monitor.       Relevant Orders   Ambulatory referral to Urology    Other Visit Diagnoses    Multiple nevi       Referral to dermatology made today. Await their input.   Relevant Orders   Ambulatory referral to Dermatology       Follow up plan: Return in about 4 weeks (around 11/29/2019) for BP.

## 2019-11-01 NOTE — Assessment & Plan Note (Signed)
Checking labs today. Await results. Treat as needed.  

## 2019-11-01 NOTE — Assessment & Plan Note (Signed)
Under good control on current regimen. Continue current regimen. Continue to monitor. Call with any concerns. Refills given.   

## 2019-11-01 NOTE — Assessment & Plan Note (Signed)
Referral to urology made today. Call with any concerns. Continue to monitor.

## 2019-11-01 NOTE — Assessment & Plan Note (Signed)
Will keep BP and cholesterol under good control. Continue to monitor. Call with any concerns.  

## 2019-11-01 NOTE — Assessment & Plan Note (Signed)
Not under good control. Will increase her lisinopril to 10mg  and recheck 1 month. Call with any concerns.

## 2019-11-01 NOTE — Assessment & Plan Note (Signed)
Under good control on current regimen. Continue current regimen. Continue to monitor. Call with any concerns. Refills given. Labs drawn today.   

## 2019-11-02 LAB — COMPREHENSIVE METABOLIC PANEL
ALT: 16 IU/L (ref 0–32)
AST: 19 IU/L (ref 0–40)
Albumin/Globulin Ratio: 1.9 (ref 1.2–2.2)
Albumin: 4.3 g/dL (ref 3.8–4.8)
Alkaline Phosphatase: 85 IU/L (ref 48–121)
BUN/Creatinine Ratio: 12 (ref 12–28)
BUN: 13 mg/dL (ref 8–27)
Bilirubin Total: 0.3 mg/dL (ref 0.0–1.2)
CO2: 25 mmol/L (ref 20–29)
Calcium: 9.7 mg/dL (ref 8.7–10.3)
Chloride: 106 mmol/L (ref 96–106)
Creatinine, Ser: 1.09 mg/dL — ABNORMAL HIGH (ref 0.57–1.00)
GFR calc Af Amer: 61 mL/min/{1.73_m2} (ref >59)
GFR calc non Af Amer: 53 mL/min/{1.73_m2} — ABNORMAL LOW (ref >59)
Globulin, Total: 2.3 g/dL (ref 1.5–4.5)
Glucose: 90 mg/dL (ref 65–99)
Potassium: 4.5 mmol/L (ref 3.5–5.2)
Sodium: 143 mmol/L (ref 134–144)
Total Protein: 6.6 g/dL (ref 6.0–8.5)

## 2019-11-02 LAB — CBC WITH DIFFERENTIAL/PLATELET
Basophils Absolute: 0 10*3/uL (ref 0.0–0.2)
Basos: 1 %
EOS (ABSOLUTE): 0.3 10*3/uL (ref 0.0–0.4)
Eos: 6 %
Hematocrit: 45.8 % (ref 34.0–46.6)
Hemoglobin: 14.3 g/dL (ref 11.1–15.9)
Immature Grans (Abs): 0 10*3/uL (ref 0.0–0.1)
Immature Granulocytes: 0 %
Lymphocytes Absolute: 1.6 10*3/uL (ref 0.7–3.1)
Lymphs: 37 %
MCH: 27.3 pg (ref 26.6–33.0)
MCHC: 31.2 g/dL — ABNORMAL LOW (ref 31.5–35.7)
MCV: 87 fL (ref 79–97)
Monocytes Absolute: 0.5 10*3/uL (ref 0.1–0.9)
Monocytes: 12 %
Neutrophils Absolute: 1.9 10*3/uL (ref 1.4–7.0)
Neutrophils: 44 %
Platelets: 252 10*3/uL (ref 150–450)
RBC: 5.24 x10E6/uL (ref 3.77–5.28)
RDW: 13.9 % (ref 11.7–15.4)
WBC: 4.2 10*3/uL (ref 3.4–10.8)

## 2019-11-02 LAB — LIPID PANEL W/O CHOL/HDL RATIO
Cholesterol, Total: 156 mg/dL (ref 100–199)
HDL: 57 mg/dL (ref >39)
LDL Chol Calc (NIH): 84 mg/dL (ref 0–99)
Triglycerides: 76 mg/dL (ref 0–149)
VLDL Cholesterol Cal: 15 mg/dL (ref 5–40)

## 2019-11-07 ENCOUNTER — Encounter: Payer: Self-pay | Admitting: Family Medicine

## 2019-11-12 ENCOUNTER — Other Ambulatory Visit
Admission: RE | Admit: 2019-11-12 | Discharge: 2019-11-12 | Disposition: A | Discharge status: Home / Self Care | Payer: Medicare Other | Source: Ambulatory Visit | Attending: Urology | Admitting: Urology

## 2019-11-12 ENCOUNTER — Other Ambulatory Visit: Payer: Self-pay | Admitting: *Deleted

## 2019-11-12 ENCOUNTER — Encounter: Payer: Self-pay | Admitting: Urology

## 2019-11-12 ENCOUNTER — Telehealth: Payer: Self-pay

## 2019-11-12 ENCOUNTER — Ambulatory Visit: Payer: Medicare Other | Admitting: Urology

## 2019-11-12 ENCOUNTER — Other Ambulatory Visit: Payer: Self-pay

## 2019-11-12 VITALS — BP 167/98 | HR 69 | Ht 61.0 in | Wt 155.0 lb

## 2019-11-12 DIAGNOSIS — N3941 Urge incontinence: Secondary | ICD-10-CM | POA: Diagnosis not present

## 2019-11-12 DIAGNOSIS — N3281 Overactive bladder: Secondary | ICD-10-CM | POA: Diagnosis not present

## 2019-11-12 LAB — URINALYSIS, COMPLETE (UACMP) WITH MICROSCOPIC
Bacteria, UA: NONE SEEN
Bilirubin Urine: NEGATIVE
Glucose, UA: NEGATIVE mg/dL
Ketones, ur: NEGATIVE mg/dL
Leukocytes,Ua: NEGATIVE
Nitrite: NEGATIVE
Protein, ur: NEGATIVE mg/dL
Specific Gravity, Urine: 1.015 (ref 1.005–1.030)
WBC, UA: NONE SEEN WBC/hpf (ref 0–5)
pH: 5 (ref 5.0–8.0)

## 2019-11-12 LAB — BLADDER SCAN AMB NON-IMAGING: Scan Result: 0

## 2019-11-12 NOTE — Progress Notes (Signed)
11/12/19 1:03 PM   Jasmine Church [birth year] [id number]  CC: Groin itching, urinary urgency and frequency  HPI: I saw Jasmine Church in urology clinic today for the above issues.  She is very frustrated to be in clinic and feels that she does not require urology evaluation, but was referred here by her PCP.  She is a 69 year old female with CKD and reported lichen sclerosus/vaginal atrophy.  She reports that she started having vaginal and groin itching about 2 years ago and was started on a topical cream by her PCP which improved her itching, however gave her severe urinary symptoms of urgency, frequency, and urge incontinence.  She has intermittently discontinued the cream secondary to those side effects, with resolution of her urinary symptoms when off the cream.  She denies any history of UTI or gross hematuria.  She has very mild stress incontinence with coughing or sneezing that is not particularly bothersome.  She has coffee every morning, but mostly water during the day.  She has nocturia 1-2 times per night.  Urinalysis is benign today with 0-5 squamous cells, 0 WBCs, 0-5 RBCs, no bacteria.  PVR 0 mL.  PMH: Past Medical History:  Diagnosis Date  . Arthritis   . CKD (chronic kidney disease) stage 3, GFR 30-59 ml/min   . Hyperlipidemia   . Hypertension   . Vaginal atrophy     Surgical History: Past Surgical History:  Procedure Laterality Date  . HEMORROIDECTOMY    . HERNIA REPAIR      Family History: Family History  Problem Relation Age of Onset  . Arthritis Mother   . Hyperlipidemia Mother   . Hypertension Mother   . Heart disease Father   . Hypertension Father   . Heart attack Father   . Cancer Sister        uterus  . Hypertension Sister   . Heart disease Brother   . Heart attack Brother   . Hypertension Brother   . Stroke Maternal Grandmother   . Heart attack Brother   . Hypertension Brother   . Diabetes Neg Hx   . Breast cancer Neg Hx     Social History:   reports that she quit smoking about 15 years ago. Her smoking use included cigarettes. She has a 33.00 pack-year smoking history. She has never used smokeless tobacco. She reports that she does not drink alcohol or use drugs.  Physical Exam: BP (!) 167/98   Pulse 69   Ht 5\' 1"  (1.549 m)   Wt 155 lb (70.3 kg)   BMI 29.29 kg/m    Constitutional:  Alert and oriented, No acute distress. Cardiovascular: No clubbing, cyanosis, or edema. Respiratory: Normal respiratory effort, no increased work of breathing. GU: Patient adamantly refused pelvic exam  Laboratory Data: Reviewed  Assessment & Plan:   In summary, she is a 69 year old female with symptoms of urgency and frequency when using a specific anti-itch groin cream.  She adamantly refuses physical exam today.  I discussed with her at length that without performing exam it is difficult to provide recommendations regarding her itching and bladder symptoms.  I did recommend considering trying a powder or other medications regarding her itching to see if this avoids the urinary side effects of urgency and frequency.  We also discussed behavioral strategies including avoiding bladder irritants of caffeine, sodas, diet drinks, timed voiding, double voiding prior to bed, and minimizing fluids in the evening.  We discussed return precautions at length including gross hematuria, recurrent  UTIs, worsening urinary symptoms.  Patient prefers to follow-up as needed  Nickolas Madrid, MD 11/12/2019  [facility] [address] [place], [place] [address] [phone]

## 2019-11-12 NOTE — Telephone Encounter (Signed)
Called pt, line hung up. 1st attempt.

## 2019-11-12 NOTE — Telephone Encounter (Signed)
-----   Message from Billey Co, MD sent at 11/12/2019  2:47 PM EDT ----- UA completely normal, no evidence of infection or blood  Nickolas Madrid, MD 11/12/2019

## 2019-11-12 NOTE — Patient Instructions (Signed)

## 2019-11-14 NOTE — Telephone Encounter (Signed)
Called pt no answer. Unable to LM per DPR. 2nd attempt.

## 2019-11-15 NOTE — Telephone Encounter (Signed)
Called pt no answer. 3rd attempt.  °

## 2019-12-02 ENCOUNTER — Other Ambulatory Visit: Payer: Self-pay

## 2019-12-02 ENCOUNTER — Ambulatory Visit (INDEPENDENT_AMBULATORY_CARE_PROVIDER_SITE_OTHER): Payer: Medicare Other | Admitting: Family Medicine

## 2019-12-02 ENCOUNTER — Encounter: Payer: Self-pay | Admitting: Family Medicine

## 2019-12-02 DIAGNOSIS — I129 Hypertensive chronic kidney disease with stage 1 through stage 4 chronic kidney disease, or unspecified chronic kidney disease: Secondary | ICD-10-CM | POA: Diagnosis not present

## 2019-12-02 MED ORDER — LISINOPRIL 20 MG PO TABS: 20.0000 mg | ORAL_TABLET | Freq: Every day | ORAL | 2 refills | Status: DC

## 2019-12-02 MED ORDER — CLOBETASOL PROPIONATE 0.05 % EX CREA: 1.0000 "application " | TOPICAL_CREAM | CUTANEOUS | 1 refills | Status: DC

## 2019-12-02 NOTE — Progress Notes (Signed)
BP (!) 155/97   Pulse 75   Temp 98.3 F (36.8 C)   Wt 156 lb 2 oz (70.8 kg)   SpO2 96%   BMI 29.50 kg/m    Subjective:    Patient ID: Jasmine Church, female    DOB: 1950-10-13, 69 y.o.   MRN: [id number]  HPI: Jasmine Church is a 69 y.o. female  Chief Complaint  Patient presents with  . Hypertension   HYPERTENSION Hypertension status: better but still uncontrolled Satisfied with current treatment? no Duration of hypertension: chronic BP monitoring frequency:  rarely BP medication side effects:  no Medication compliance: excellent compliance Previous BP meds:lisinopril Aspirin: yes Recurrent headaches: no Visual changes: no Palpitations: no Dyspnea: no Chest pain: no Lower extremity edema: no Dizzy/lightheaded: no   Relevant past medical, surgical, family and social history reviewed and updated as indicated. Interim medical history since our last visit reviewed. Allergies and medications reviewed and updated.  Review of Systems  Constitutional: Negative.   Respiratory: Negative.   Cardiovascular: Negative.   Gastrointestinal: Negative.   Musculoskeletal: Negative.   Psychiatric/Behavioral: Negative.     Per HPI unless specifically indicated above     Objective:    BP (!) 155/97   Pulse 75   Temp 98.3 F (36.8 C)   Wt 156 lb 2 oz (70.8 kg)   SpO2 96%   BMI 29.50 kg/m   Wt Readings from Last 3 Encounters:  12/02/19 156 lb 2 oz (70.8 kg)  11/12/19 155 lb (70.3 kg)  11/01/19 153 lb 12.8 oz (69.8 kg)    Physical Exam Vitals and nursing note reviewed.  Constitutional:      General: She is not in acute distress.    Appearance: Normal appearance. She is not ill-appearing, toxic-appearing or diaphoretic.  HENT:     Head: Normocephalic and atraumatic.     Right Ear: External ear normal.     Left Ear: External ear normal.     Nose: Nose normal.     Mouth/Throat:     Mouth: Mucous membranes are moist.     Pharynx: Oropharynx is clear.  Eyes:      General: No scleral icterus.       Right eye: No discharge.        Left eye: No discharge.     Extraocular Movements: Extraocular movements intact.     Conjunctiva/sclera: Conjunctivae normal.     Pupils: Pupils are equal, round, and reactive to light.  Cardiovascular:     Rate and Rhythm: Normal rate and regular rhythm.     Pulses: Normal pulses.     Heart sounds: Normal heart sounds. No murmur heard.  No friction rub. No gallop.   Pulmonary:     Effort: Pulmonary effort is normal. No respiratory distress.     Breath sounds: Normal breath sounds. No stridor. No wheezing, rhonchi or rales.  Chest:     Chest wall: No tenderness.  Musculoskeletal:        General: Normal range of motion.     Cervical back: Normal range of motion and neck supple.  Skin:    General: Skin is warm and dry.     Capillary Refill: Capillary refill takes less than 2 seconds.     Coloration: Skin is not jaundiced or pale.     Findings: No bruising, erythema, lesion or rash.  Neurological:     General: No focal deficit present.     Mental Status: She is alert and oriented  to person, place, and time. Mental status is at baseline.  Psychiatric:        Mood and Affect: Mood normal.        Behavior: Behavior normal.        Thought Content: Thought content normal.        Judgment: Judgment normal.     Results for orders placed or performed during the hospital encounter of 11/12/19  Urinalysis, Complete w Microscopic  Result Value Ref Range   Color, Urine STRAW (A) YELLOW   APPearance CLEAR CLEAR   Specific Gravity, Urine 1.015 1.005 - 1.030   pH 5.0 5.0 - 8.0   Glucose, UA NEGATIVE NEGATIVE mg/dL   Hgb urine dipstick MODERATE (A) NEGATIVE   Bilirubin Urine NEGATIVE NEGATIVE   Ketones, ur NEGATIVE NEGATIVE mg/dL   Protein, ur NEGATIVE NEGATIVE mg/dL   Nitrite NEGATIVE NEGATIVE   Leukocytes,Ua NEGATIVE NEGATIVE   Squamous Epithelial / LPF 0-5 0 - 5   WBC, UA NONE SEEN 0 - 5 WBC/hpf   RBC / HPF 0-5 0 -  5 RBC/hpf   Bacteria, UA NONE SEEN NONE SEEN      Assessment & Plan:   Problem List Items Addressed This Visit      Genitourinary   Benign hypertensive renal disease    Better, but still not under good control. Will increase her lisinopril to 20mg  and recheck 1 month. Will hold on BMP until that time. Call with any concerns.           Follow up plan: Return in about 4 weeks (around 12/30/2019).

## 2019-12-02 NOTE — Assessment & Plan Note (Signed)
Better, but still not under good control. Will increase her lisinopril to 20mg  and recheck 1 month. Will hold on BMP until that time. Call with any concerns.

## 2019-12-31 ENCOUNTER — Other Ambulatory Visit: Payer: Self-pay

## 2019-12-31 ENCOUNTER — Encounter: Payer: Self-pay | Admitting: Family Medicine

## 2019-12-31 ENCOUNTER — Ambulatory Visit (INDEPENDENT_AMBULATORY_CARE_PROVIDER_SITE_OTHER): Payer: Medicare Other | Admitting: Family Medicine

## 2019-12-31 VITALS — BP 118/82 | HR 71 | Temp 98.4°F | Wt 155.8 lb

## 2019-12-31 DIAGNOSIS — I129 Hypertensive chronic kidney disease with stage 1 through stage 4 chronic kidney disease, or unspecified chronic kidney disease: Secondary | ICD-10-CM

## 2019-12-31 MED ORDER — ATORVASTATIN CALCIUM 20 MG PO TABS: 20.0000 mg | ORAL_TABLET | Freq: Every day | ORAL | 0 refills | Status: DC

## 2019-12-31 MED ORDER — LISINOPRIL 20 MG PO TABS: 20.0000 mg | ORAL_TABLET | Freq: Every day | ORAL | 1 refills | Status: DC

## 2019-12-31 MED ORDER — OMEPRAZOLE 20 MG PO CPDR: 20.0000 mg | DELAYED_RELEASE_CAPSULE | Freq: Every day | ORAL | 0 refills | Status: DC

## 2019-12-31 NOTE — Assessment & Plan Note (Signed)
Under good control on current regimen. Continue current regimen. Continue to monitor. Call with any concerns. Refills given. Labs drawn today.   

## 2019-12-31 NOTE — Progress Notes (Signed)
BP 118/82 (BP Location: Left Arm, Cuff Size: Normal)   Pulse 71   Temp 98.4 F (36.9 C) (Oral)   Wt 155 lb 12.8 oz (70.7 kg)   SpO2 99%   BMI 29.44 kg/m    Subjective:    Patient ID: Jasmine Church, female    DOB: April 15, 1951, 69 y.o.   MRN: [id number]  HPI: Jasmine Church is a 69 y.o. female  Chief Complaint  Patient presents with  . Hypertension    4 week f/up   HYPERTENSION Hypertension status: better  Satisfied with current treatment? yes Duration of hypertension: chronic BP monitoring frequency:  not checking BP medication side effects:  no Medication compliance: excellent compliance Previous BP meds: lisinopril Aspirin: no Recurrent headaches: no Visual changes: no Palpitations: no Dyspnea: no Chest pain: no Lower extremity edema: no Dizzy/lightheaded: no   Relevant past medical, surgical, family and social history reviewed and updated as indicated. Interim medical history since our last visit reviewed. Allergies and medications reviewed and updated.  Review of Systems  Constitutional: Negative.   Respiratory: Negative.   Cardiovascular: Negative.   Gastrointestinal: Negative.   Musculoskeletal: Negative.   Psychiatric/Behavioral: Negative.     Per HPI unless specifically indicated above     Objective:    BP 118/82 (BP Location: Left Arm, Cuff Size: Normal)   Pulse 71   Temp 98.4 F (36.9 C) (Oral)   Wt 155 lb 12.8 oz (70.7 kg)   SpO2 99%   BMI 29.44 kg/m   Wt Readings from Last 3 Encounters:  12/31/19 155 lb 12.8 oz (70.7 kg)  12/02/19 156 lb 2 oz (70.8 kg)  11/12/19 155 lb (70.3 kg)    Physical Exam Vitals and nursing note reviewed.  Constitutional:      General: She is not in acute distress.    Appearance: Normal appearance. She is not ill-appearing, toxic-appearing or diaphoretic.  HENT:     Head: Normocephalic and atraumatic.     Right Ear: External ear normal.     Left Ear: External ear normal.     Nose: Nose normal.      Mouth/Throat:     Mouth: Mucous membranes are moist.     Pharynx: Oropharynx is clear.  Eyes:     General: No scleral icterus.       Right eye: No discharge.        Left eye: No discharge.     Extraocular Movements: Extraocular movements intact.     Conjunctiva/sclera: Conjunctivae normal.     Pupils: Pupils are equal, round, and reactive to light.  Cardiovascular:     Rate and Rhythm: Normal rate and regular rhythm.     Pulses: Normal pulses.     Heart sounds: Normal heart sounds. No murmur heard.  No friction rub. No gallop.   Pulmonary:     Effort: Pulmonary effort is normal. No respiratory distress.     Breath sounds: Normal breath sounds. No stridor. No wheezing, rhonchi or rales.  Chest:     Chest wall: No tenderness.  Musculoskeletal:        General: Normal range of motion.     Cervical back: Normal range of motion and neck supple.  Skin:    General: Skin is warm and dry.     Capillary Refill: Capillary refill takes less than 2 seconds.     Coloration: Skin is not jaundiced or pale.     Findings: No bruising, erythema, lesion or rash.  Neurological:  General: No focal deficit present.     Mental Status: She is alert and oriented to person, place, and time. Mental status is at baseline.  Psychiatric:        Mood and Affect: Mood normal.        Behavior: Behavior normal.        Thought Content: Thought content normal.        Judgment: Judgment normal.     Results for orders placed or performed during the hospital encounter of 11/12/19  Urinalysis, Complete w Microscopic  Result Value Ref Range   Color, Urine STRAW (A) YELLOW   APPearance CLEAR CLEAR   Specific Gravity, Urine 1.015 1.005 - 1.030   pH 5.0 5.0 - 8.0   Glucose, UA NEGATIVE NEGATIVE mg/dL   Hgb urine dipstick MODERATE (A) NEGATIVE   Bilirubin Urine NEGATIVE NEGATIVE   Ketones, ur NEGATIVE NEGATIVE mg/dL   Protein, ur NEGATIVE NEGATIVE mg/dL   Nitrite NEGATIVE NEGATIVE   Leukocytes,Ua NEGATIVE  NEGATIVE   Squamous Epithelial / LPF 0-5 0 - 5   WBC, UA NONE SEEN 0 - 5 WBC/hpf   RBC / HPF 0-5 0 - 5 RBC/hpf   Bacteria, UA NONE SEEN NONE SEEN      Assessment & Plan:   Problem List Items Addressed This Visit      Genitourinary   Benign hypertensive renal disease - Primary    Under good control on current regimen. Continue current regimen. Continue to monitor. Call with any concerns. Refills given. Labs drawn today.       Relevant Orders   Basic metabolic panel       Follow up plan: Return in about 6 months (around 07/02/2020) for wellness/physical.

## 2020-01-01 ENCOUNTER — Other Ambulatory Visit: Payer: Self-pay | Admitting: Family Medicine

## 2020-01-01 DIAGNOSIS — Z1231 Encounter for screening mammogram for malignant neoplasm of breast: Secondary | ICD-10-CM

## 2020-01-01 LAB — BASIC METABOLIC PANEL
BUN/Creatinine Ratio: 19 (ref 12–28)
BUN: 19 mg/dL (ref 8–27)
CO2: 26 mmol/L (ref 20–29)
Calcium: 9.3 mg/dL (ref 8.7–10.3)
Chloride: 104 mmol/L (ref 96–106)
Creatinine, Ser: 1.01 mg/dL — ABNORMAL HIGH (ref 0.57–1.00)
GFR calc Af Amer: 67 mL/min/{1.73_m2} (ref >59)
GFR calc non Af Amer: 58 mL/min/{1.73_m2} — ABNORMAL LOW (ref >59)
Glucose: 80 mg/dL (ref 65–99)
Potassium: 4.7 mmol/L (ref 3.5–5.2)
Sodium: 142 mmol/L (ref 134–144)

## 2020-01-02 ENCOUNTER — Encounter: Payer: Self-pay | Admitting: Family Medicine

## 2020-02-05 ENCOUNTER — Ambulatory Visit: Payer: Medicare Other | Admitting: Dermatology

## 2020-02-17 ENCOUNTER — Encounter: Payer: Self-pay | Admitting: Dermatology

## 2020-02-17 ENCOUNTER — Ambulatory Visit: Payer: Medicare Other | Admitting: Dermatology

## 2020-02-17 ENCOUNTER — Other Ambulatory Visit: Payer: Self-pay

## 2020-02-17 DIAGNOSIS — D229 Melanocytic nevi, unspecified: Secondary | ICD-10-CM | POA: Diagnosis not present

## 2020-02-17 DIAGNOSIS — L820 Inflamed seborrheic keratosis: Secondary | ICD-10-CM

## 2020-02-17 DIAGNOSIS — L821 Other seborrheic keratosis: Secondary | ICD-10-CM

## 2020-02-17 DIAGNOSIS — L814 Other melanin hyperpigmentation: Secondary | ICD-10-CM

## 2020-02-17 DIAGNOSIS — Z1283 Encounter for screening for malignant neoplasm of skin: Secondary | ICD-10-CM | POA: Diagnosis not present

## 2020-02-17 DIAGNOSIS — L578 Other skin changes due to chronic exposure to nonionizing radiation: Secondary | ICD-10-CM

## 2020-02-17 DIAGNOSIS — D1800 Hemangioma unspecified site: Secondary | ICD-10-CM | POA: Diagnosis not present

## 2020-02-17 NOTE — Patient Instructions (Addendum)
Recommend daily broad spectrum sunscreen SPF 30+ to sun-exposed areas, reapply every 2 hours as needed. Call for new or changing lesions.  Seborrheic Keratosis  What causes seborrheic keratoses? Seborrheic keratoses are harmless, common skin growths that first appear during adult life.  As time goes by, more growths appear.  Some people may develop a large number of them.  Seborrheic keratoses appear on both covered and uncovered body parts.  They are not caused by sunlight.  The tendency to develop seborrheic keratoses can be inherited.  They vary in color from skin-colored to gray, brown, or even black.  They can be either smooth or have a rough, warty surface.   Seborrheic keratoses are superficial and look as if they were stuck on the skin.  Under the microscope this type of keratosis looks like layers upon layers of skin.  That is why at times the top layer may seem to fall off, but the rest of the growth remains and re-grows.    Treatment Seborrheic keratoses do not need to be treated, but can easily be removed in the office.  Seborrheic keratoses often cause symptoms when they rub on clothing or jewelry.  Lesions can be in the way of shaving.  If they become inflamed, they can cause itching, soreness, or burning.  Removal of a seborrheic keratosis can be accomplished by freezing, burning, or surgery. If any spot bleeds, scabs, or grows rapidly, please return to have it checked, as these can be an indication of a skin cancer.  

## 2020-02-17 NOTE — Progress Notes (Deleted)
° °  New Patient Visit  Subjective  Jasmine Church is a 69 y.o. female who presents for the following: New Patient (Initial Visit).  Patient presents today UBSE, patient does have a few areas of concern on her head, bl/l arms, and under her right breast. Patient does not have a h/o skin cancer to her knowledge  New Patient Visit  Subjective  Jasmine Church is a 69 y.o. female who presents for the following: New Patient (Initial Visit).  ***  The following portions of the chart were reviewed this encounter and updated as appropriate:      Review of Systems:  No other skin or systemic complaints except as noted in HPI or Assessment and Plan.  Objective  Well appearing patient in no apparent distress; mood and affect are within normal limits.  {[id number]::"O full examination was performed including scalp, head, eyes, ears, nose, lips, neck, chest, axillae, abdomen, back, buttocks, bilateral upper extremities, bilateral lower extremities, hands, feet, fingers, toes, fingernails, and toenails. All findings within normal limits unless otherwise noted below."}    Assessment & Plan   No follow-ups on file.  .   The following portions of the chart were reviewed this encounter and updated as appropriate:      Review of Systems:  No other skin or systemic complaints except as noted in HPI or Assessment and Plan.  Objective  Well appearing patient in no apparent distress; mood and affect are within normal limits.  A focused examination was performed including {sites:[id number]}. Relevant physical exam findings are noted in the Assessment and Plan.    Assessment & Plan   No follow-ups on file.

## 2020-02-17 NOTE — Progress Notes (Signed)
   New Patient Visit  Subjective  Jasmine Church is a 69 y.o. female who presents for the following: New Patient (Initial Visit). Patient presents today UBSE, patient does have a few areas of concern on her head, bl/l arms, and under her right breast. Patient does not have a h/o skin cancer to her knowledge The patient presents for Upper Body Skin Exam (UBSE) for skin cancer screening and mole check.  The following portions of the chart were reviewed this encounter and updated as appropriate:  Tobacco  Allergies  Meds  Problems  Med Hx  Surg Hx  Fam Hx     Review of Systems:  No other skin or systemic complaints except as noted in HPI or Assessment and Plan.  Objective  Well appearing patient in no apparent distress; mood and affect are within normal limits.  A focused examination was performed including upper extremities, including the arms, hands, fingers, and fingernails, lower extremities, including the legs, feet, toes, and toenails, head, including the scalp, face, neck, nose, ears, eyelids, and lips and face, neck, chest and back. Relevant physical exam findings are noted in the Assessment and Plan.  Objective  Left Forearm - Anterior, Left Upper Back, Right Medial Tricep: Erythematous keratotic or waxy stuck-on papule or plaque.   Assessment & Plan    Inflamed seborrheic keratosis (3) Right Medial Tricep; Left Forearm - Anterior; Left Upper Back  Destruction of lesion - Left Forearm - Anterior, Left Upper Back, Right Medial Tricep Complexity: simple   Destruction method: cryotherapy   Informed consent: discussed and consent obtained   Timeout:  patient name, date of birth, surgical site, and procedure verified Lesion destroyed using liquid nitrogen: Yes   Region frozen until ice ball extended beyond lesion: Yes   Outcome: patient tolerated procedure well with no complications   Post-procedure details: wound care instructions given    Skin cancer  screening  Lentigines - Scattered tan macules - Discussed due to sun exposure - Benign, observe - Call for any changes  Seborrheic Keratoses - Stuck-on, waxy, tan-brown papules and plaques  - Discussed benign etiology and prognosis. - Observe - Call for any changes  Melanocytic Nevi - Tan-brown and/or pink-flesh-colored symmetric macules and papules - Benign appearing on exam today - Observation - Call clinic for new or changing moles - Recommend daily use of broad spectrum spf 30+ sunscreen to sun-exposed areas.   Hemangiomas - Red papules - Discussed benign nature - Observe - Call for any changes  Actinic Damage - diffuse scaly erythematous macules with underlying dyspigmentation - Recommend daily broad spectrum sunscreen SPF 30+ to sun-exposed areas, reapply every 2 hours as needed.  - Call for new or changing lesions.  Skin cancer screening performed today.  Return if symptoms worsen or fail to improve.  Marene Lenz, CMA, am acting as scribe for Sarina Ser, MD . Documentation: I have reviewed the above documentation for accuracy and completeness, and I agree with the above.  Sarina Ser, MD

## 2020-02-21 ENCOUNTER — Other Ambulatory Visit: Payer: Self-pay

## 2020-02-21 ENCOUNTER — Ambulatory Visit
Admission: RE | Admit: 2020-02-21 | Discharge: 2020-02-21 | Disposition: A | Discharge status: Home / Self Care | Payer: Medicare Other | Source: Ambulatory Visit | Attending: Family Medicine | Admitting: Family Medicine

## 2020-02-21 DIAGNOSIS — Z1231 Encounter for screening mammogram for malignant neoplasm of breast: Secondary | ICD-10-CM | POA: Diagnosis not present

## 2020-02-24 ENCOUNTER — Encounter: Payer: Self-pay | Admitting: Family Medicine

## 2020-03-02 ENCOUNTER — Other Ambulatory Visit: Payer: Self-pay

## 2020-03-02 ENCOUNTER — Ambulatory Visit (INDEPENDENT_AMBULATORY_CARE_PROVIDER_SITE_OTHER): Payer: Medicare Other

## 2020-03-02 DIAGNOSIS — Z23 Encounter for immunization: Secondary | ICD-10-CM

## 2020-03-05 ENCOUNTER — Other Ambulatory Visit: Payer: Self-pay | Admitting: Family Medicine

## 2020-03-05 NOTE — Telephone Encounter (Signed)
Requested Prescriptions  Pending Prescriptions Disp Refills  . omeprazole (PRILOSEC) 20 MG capsule [Pharmacy Med Name: Omeprazole 20 MG Oral Capsule Delayed Release] 90 capsule 3    Sig: TAKE 1 CAPSULE BY MOUTH  DAILY     Gastroenterology: Proton Pump Inhibitors Passed - 03/05/2020  5:23 AM      Passed - Valid encounter within last 12 months    Recent Outpatient Visits          2 months ago Benign hypertensive renal disease   [facility] [place], Megan P, DO   3 months ago Benign hypertensive renal disease   [facility] [place], Megan P, DO   4 months ago Coronary artery disease involving native coronary artery of native heart without angina pectoris   [place], Megan P, DO   10 months ago Wellness examination   [employer], [place], DO   1 year ago Benign hypertensive renal disease   [facility] [place], Barb Merino, DO      Future Appointments            In 4 months Johnson, Barb Merino, DO [employer], [place]   In 5 months  [employer]

## 2020-05-04 ENCOUNTER — Other Ambulatory Visit: Payer: Self-pay | Admitting: Family Medicine

## 2020-05-20 ENCOUNTER — Other Ambulatory Visit: Payer: Self-pay | Admitting: Family Medicine

## 2020-07-05 NOTE — Progress Notes (Signed)
BP (!) 146/82 (BP Location: Left Arm, Patient Position: Sitting, Cuff Size: Normal)   Pulse 81   Temp 98.7 F (37.1 C)   Ht 5' 2.32" (1.583 m)   Wt 152 lb 8 oz (69.2 kg)   SpO2 97%   BMI 27.60 kg/m    Subjective:    Patient ID: Jasmine Church, female    DOB: 01-20-1951, 70 y.o.   MRN: [id number]  HPI: Jasmine Church is a 70 y.o. female presenting on 07/06/2020 for comprehensive medical examination. Current medical complaints include:  HYPERTENSION / HYPERLIPIDEMIA Satisfied with current treatment? yes Duration of hypertension: chronic BP monitoring frequency: not checking BP medication side effects: no Past BP meds: lisinopril,  Duration of hyperlipidemia: chronic Cholesterol medication side effects: no Cholesterol supplements: none Past cholesterol medications: atorvastatin Medication compliance: excellent compliance Aspirin: yes Recent stressors: no Recurrent headaches: no Visual changes: no Palpitations: no Dyspnea: no Chest pain: no Lower extremity edema: no Dizzy/lightheaded: no  COPD COPD status: stable Satisfied with current treatment?: yes Oxygen use: no Dyspnea frequency: none Cough frequency: occasionally Rescue inhaler frequency:   Limitation of activity: yes Productive cough: yes Pneumovax: Refused Influenza: Up to Date  She currently lives with: husband Menopausal Symptoms: yes- stable  Depression Screen done today and results listed below:  Depression screen [facility] 2/9 07/06/2020 08/05/2019 04/23/2019 03/07/2017 03/02/2016  Decreased Interest 0 0 0 0 0  Down, Depressed, Hopeless 0 0 0 0 0  PHQ - 2 Score 0 0 0 0 0    Past Medical History:  Past Medical History:  Diagnosis Date  . Arthritis   . CKD (chronic kidney disease) stage 3, GFR 30-59 ml/min (HCC)   . Hyperlipidemia   . Hypertension   . Vaginal atrophy     Surgical History:  Past Surgical History:  Procedure Laterality Date  . HEMORROIDECTOMY    . HERNIA REPAIR       Medications:  Current Outpatient Medications on File Prior to Visit  Medication Sig  . aspirin EC 81 MG tablet Take 81 mg by mouth daily.  . Calcium Carbonate-Vitamin D (CALCIUM 500 + D) 500-125 MG-UNIT TABS Take by mouth daily. Patient takes it only 3 days a week.  . polyethylene glycol powder (GLYCOLAX/MIRALAX) powder Take 17 g by mouth 3 (three) times daily as needed.   No current facility-administered medications on file prior to visit.    Allergies:  No Known Allergies  Social History:  Social History   Socioeconomic History  . Marital status: Married    Spouse name: Not on file  . Number of children: Not on file  . Years of education: Not on file  . Highest education level: Not on file  Occupational History  . Not on file  Tobacco Use  . Smoking status: Former Smoker    Packs/day: 1.00    Years: 33.00    Pack years: 33.00    Types: Cigarettes    Quit date: 06/06/2004    Years since quitting: 16.0  . Smokeless tobacco: Never Used  Vaping Use  . Vaping Use: Never used  Substance and Sexual Activity  . Alcohol use: No  . Drug use: No  . Sexual activity: Never  Other Topics Concern  . Not on file  Social History Narrative  . Not on file   Social Determinants of Health   Financial Resource Strain: Not on file  Food Insecurity: Not on file  Transportation Needs: Not on file  Physical Activity: Not on  file  Stress: Not on file  Social Connections: Not on file  Intimate Partner Violence: Not on file   Social History   Tobacco Use  Smoking Status Former Smoker  . Packs/day: 1.00  . Years: 33.00  . Pack years: 33.00  . Types: Cigarettes  . Quit date: 06/06/2004  . Years since quitting: 16.0  Smokeless Tobacco Never Used   Social History   Substance and Sexual Activity  Alcohol Use No    Family History:  Family History  Problem Relation Age of Onset  . Arthritis Mother   . Hyperlipidemia Mother   . Hypertension Mother   . Heart disease Father    . Hypertension Father   . Heart attack Father   . Cancer Sister        uterus  . Hypertension Sister   . Heart disease Brother   . Heart attack Brother   . Hypertension Brother   . Stroke Maternal Grandmother   . Heart attack Brother   . Hypertension Brother   . Diabetes Neg Hx   . Breast cancer Neg Hx     Past medical history, surgical history, medications, allergies, family history and social history reviewed with patient today and changes made to appropriate areas of the chart.   Review of Systems  Constitutional: Negative.   HENT: Negative.   Eyes: Negative.   Respiratory: Positive for cough. Negative for hemoptysis, sputum production, shortness of breath and wheezing.   Cardiovascular: Negative.   Gastrointestinal: Positive for heartburn (with food choices). Negative for abdominal pain, blood in stool, constipation, diarrhea, melena, nausea and vomiting.  Genitourinary: Negative.   Musculoskeletal: Negative.   Skin: Negative.   Neurological: Negative.   Endo/Heme/Allergies: Negative.   Psychiatric/Behavioral: Negative.    All other ROS negative except what is listed above and in the HPI.      Objective:    BP (!) 146/82 (BP Location: Left Arm, Patient Position: Sitting, Cuff Size: Normal)   Pulse 81   Temp 98.7 F (37.1 C)   Ht 5' 2.32" (1.583 m)   Wt 152 lb 8 oz (69.2 kg)   SpO2 97%   BMI 27.60 kg/m   Wt Readings from Last 3 Encounters:  07/06/20 152 lb 8 oz (69.2 kg)  12/31/19 155 lb 12.8 oz (70.7 kg)  12/02/19 156 lb 2 oz (70.8 kg)    Physical Exam Vitals and nursing note reviewed.  Constitutional:      General: She is not in acute distress.    Appearance: Normal appearance. She is not ill-appearing, toxic-appearing or diaphoretic.  HENT:     Head: Normocephalic and atraumatic.     Right Ear: Tympanic membrane, ear canal and external ear normal. There is no impacted cerumen.     Left Ear: Tympanic membrane, ear canal and external ear normal. There  is no impacted cerumen.     Nose: Nose normal. No congestion or rhinorrhea.     Mouth/Throat:     Mouth: Mucous membranes are moist.     Pharynx: Oropharynx is clear. No oropharyngeal exudate or posterior oropharyngeal erythema.  Eyes:     General: No scleral icterus.       Right eye: No discharge.        Left eye: No discharge.     Extraocular Movements: Extraocular movements intact.     Conjunctiva/sclera: Conjunctivae normal.     Pupils: Pupils are equal, round, and reactive to light.  Neck:     Vascular: No carotid  bruit.  Cardiovascular:     Rate and Rhythm: Normal rate and regular rhythm.     Pulses: Normal pulses.     Heart sounds: No murmur heard. No friction rub. No gallop.   Pulmonary:     Effort: Pulmonary effort is normal. No respiratory distress.     Breath sounds: Normal breath sounds. No stridor. No wheezing, rhonchi or rales.  Chest:     Chest wall: No tenderness.  Abdominal:     General: Abdomen is flat. Bowel sounds are normal. There is no distension.     Palpations: Abdomen is soft. There is no mass.     Tenderness: There is no abdominal tenderness. There is no right CVA tenderness, left CVA tenderness, guarding or rebound.     Hernia: No hernia is present.  Genitourinary:    Comments: Breast and pelvic exams deferred with shared decision making Musculoskeletal:        General: No swelling, tenderness, deformity or signs of injury.     Cervical back: Normal range of motion and neck supple. No rigidity. No muscular tenderness.     Right lower leg: No edema.     Left lower leg: No edema.  Lymphadenopathy:     Cervical: No cervical adenopathy.  Skin:    General: Skin is warm and dry.     Capillary Refill: Capillary refill takes less than 2 seconds.     Coloration: Skin is not jaundiced or pale.     Findings: No bruising, erythema, lesion or rash.  Neurological:     General: No focal deficit present.     Mental Status: She is alert and oriented to person,  place, and time. Mental status is at baseline.     Cranial Nerves: No cranial nerve deficit.     Sensory: No sensory deficit.     Motor: No weakness.     Coordination: Coordination normal.     Gait: Gait normal.     Deep Tendon Reflexes: Reflexes normal.  Psychiatric:        Mood and Affect: Mood normal.        Behavior: Behavior normal.        Thought Content: Thought content normal.        Judgment: Judgment normal.     Results for orders placed or performed in visit on [id number]  Basic metabolic panel  Result Value Ref Range   Glucose 80 65 - 99 mg/dL   BUN 19 8 - 27 mg/dL   Creatinine, Ser 1.01 (H) 0.57 - 1.00 mg/dL   GFR calc non Af Amer 58 (L) >59 mL/min/1.73   GFR calc Af Amer 67 >59 mL/min/1.73   BUN/Creatinine Ratio 19 12 - 28   Sodium 142 134 - 144 mmol/L   Potassium 4.7 3.5 - 5.2 mmol/L   Chloride 104 96 - 106 mmol/L   CO2 26 20 - 29 mmol/L   Calcium 9.3 8.7 - 10.3 mg/dL      Assessment & Plan:   Problem List Items Addressed This Visit      Cardiovascular and Mediastinum   CAD (coronary artery disease)    Will keep BP and cholesterol under good control. Continue to monitor. Call with any concerns.       Relevant Medications   lisinopril (ZESTRIL) 30 MG tablet   atorvastatin (LIPITOR) 20 MG tablet   Other Relevant Orders   CBC with Differential/Platelet   Comprehensive metabolic panel   Lipid Panel w/o Chol/HDL Ratio  Respiratory   COPD (chronic obstructive pulmonary disease) (HCC)    Under good control on current regimen. Continue current regimen. Continue to monitor. Call with any concerns. Declines inhalers.       Relevant Orders   CBC with Differential/Platelet   Comprehensive metabolic panel     Musculoskeletal and Integument   Osteoporosis    Due for recheck at the end of the year. Checking labs today. Await results.       Relevant Orders   CBC with Differential/Platelet   Comprehensive metabolic panel   TSH   VITAMIN D 25 Hydroxy  (Vit-D Deficiency, Fractures)     Genitourinary   Benign hypertensive renal disease    Running high. Will increase her lisinopril to 30mg  and recheck 1 month.       Relevant Orders   CBC with Differential/Platelet   Comprehensive metabolic panel   Microalbumin, Urine Waived   TSH   CKD (chronic kidney disease) stage 3, GFR 30-59 ml/min (HCC)    Rechecking labs today. Await results.       Relevant Orders   CBC with Differential/Platelet   Comprehensive metabolic panel   Microalbumin, Urine Waived   Urinalysis, Routine w reflex microscopic     Other   Hyperlipidemia    Under good control on current regimen. Continue current regimen. Continue to monitor. Call with any concerns. Refills given. Labs drawn today.        Relevant Medications   lisinopril (ZESTRIL) 30 MG tablet   atorvastatin (LIPITOR) 20 MG tablet   Other Relevant Orders   CBC with Differential/Platelet   Comprehensive metabolic panel   Lipid Panel w/o Chol/HDL Ratio    Other Visit Diagnoses    Routine general medical examination at a health care facility    -  Primary   Vaccines up to date/declined. Screening labs checked today. Mammo, DEXA and cologuard up to date. Continue diet and exercise. Call with any concerns.    Cough       Out of the window for covid testing. Lungs clear. Continue symptomatic care. Declines inhaler.       Follow up plan: Return in about 4 weeks (around 08/03/2020) for follow up BP.   LABORATORY TESTING:  - Pap smear: not applicable  IMMUNIZATIONS:   - Tdap: Tetanus vaccination status reviewed: last tetanus booster within 10 years. - Influenza: Up to date - Pneumovax: Refused - Prevnar: up to date - COVID: Refused  SCREENING: -Mammogram: Up to date  - Colonoscopy: Up to date  - Bone Density: Up to date   PATIENT COUNSELING:   Advised to take 1 mg of folate supplement per day if capable of pregnancy.   Sexuality: Discussed sexually transmitted diseases, partner  selection, use of condoms, avoidance of unintended pregnancy  and contraceptive alternatives.   Advised to avoid cigarette smoking.  I discussed with the patient that most people either abstain from alcohol or drink within safe limits (<=14/week and <=4 drinks/occasion for males, <=7/weeks and <= 3 drinks/occasion for females) and that the risk for alcohol disorders and other health effects rises proportionally with the number of drinks per week and how often a drinker exceeds daily limits.  Discussed cessation/primary prevention of drug use and availability of treatment for abuse.   Diet: Encouraged to adjust caloric intake to maintain  or achieve ideal body weight, to reduce intake of dietary saturated fat and total fat, to limit sodium intake by avoiding high sodium foods and not adding table salt, and  to maintain adequate dietary potassium and calcium preferably from fresh fruits, vegetables, and low-fat dairy products.    stressed the importance of regular exercise  Injury prevention: Discussed safety belts, safety helmets, smoke detector, smoking near bedding or upholstery.   Dental health: Discussed importance of regular tooth brushing, flossing, and dental visits.    NEXT PREVENTATIVE PHYSICAL DUE IN 1 YEAR. Return in about 4 weeks (around 08/03/2020) for follow up BP.

## 2020-07-06 ENCOUNTER — Other Ambulatory Visit: Payer: Self-pay

## 2020-07-06 ENCOUNTER — Ambulatory Visit (INDEPENDENT_AMBULATORY_CARE_PROVIDER_SITE_OTHER): Payer: Medicare Other | Admitting: Family Medicine

## 2020-07-06 ENCOUNTER — Encounter: Payer: Self-pay | Admitting: Family Medicine

## 2020-07-06 VITALS — BP 146/82 | HR 81 | Temp 98.7°F | Ht 62.32 in | Wt 152.5 lb

## 2020-07-06 DIAGNOSIS — I129 Hypertensive chronic kidney disease with stage 1 through stage 4 chronic kidney disease, or unspecified chronic kidney disease: Secondary | ICD-10-CM | POA: Diagnosis not present

## 2020-07-06 DIAGNOSIS — R059 Cough, unspecified: Secondary | ICD-10-CM | POA: Diagnosis not present

## 2020-07-06 DIAGNOSIS — E782 Mixed hyperlipidemia: Secondary | ICD-10-CM

## 2020-07-06 DIAGNOSIS — Z0000 Encounter for general adult medical examination without abnormal findings: Secondary | ICD-10-CM | POA: Diagnosis not present

## 2020-07-06 DIAGNOSIS — J449 Chronic obstructive pulmonary disease, unspecified: Secondary | ICD-10-CM

## 2020-07-06 DIAGNOSIS — N183 Chronic kidney disease, stage 3 unspecified: Secondary | ICD-10-CM | POA: Diagnosis not present

## 2020-07-06 DIAGNOSIS — I2510 Atherosclerotic heart disease of native coronary artery without angina pectoris: Secondary | ICD-10-CM | POA: Diagnosis not present

## 2020-07-06 DIAGNOSIS — M816 Localized osteoporosis [Lequesne]: Secondary | ICD-10-CM

## 2020-07-06 LAB — URINALYSIS, ROUTINE W REFLEX MICROSCOPIC
Bilirubin, UA: NEGATIVE
Glucose, UA: NEGATIVE
Leukocytes,UA: NEGATIVE
Nitrite, UA: NEGATIVE
Protein,UA: NEGATIVE
Specific Gravity, UA: 1.025 (ref 1.005–1.030)
Urobilinogen, Ur: 0.2 mg/dL (ref 0.2–1.0)
pH, UA: 5 (ref 5.0–7.5)

## 2020-07-06 LAB — MICROALBUMIN, URINE WAIVED
Creatinine, Urine Waived: 200 mg/dL (ref 10–300)
Microalb, Ur Waived: 30 mg/L — ABNORMAL HIGH (ref 0–19)
Microalb/Creat Ratio: <30 mg/g (ref <30)

## 2020-07-06 LAB — MICROSCOPIC EXAMINATION
Bacteria, UA: NONE SEEN
WBC, UA: NONE SEEN /hpf (ref 0–5)

## 2020-07-06 MED ORDER — OMEPRAZOLE 20 MG PO CPDR
20.0000 mg | DELAYED_RELEASE_CAPSULE | Freq: Every day | ORAL | 3 refills | Status: DC
Start: 2020-07-06 — End: 2021-07-08

## 2020-07-06 MED ORDER — LISINOPRIL 30 MG PO TABS: 30.0000 mg | ORAL_TABLET | Freq: Every day | ORAL | 1 refills | Status: DC

## 2020-07-06 MED ORDER — CLOBETASOL PROPIONATE 0.05 % EX CREA: 1.0000 "application " | TOPICAL_CREAM | CUTANEOUS | 1 refills | Status: DC

## 2020-07-06 MED ORDER — ATORVASTATIN CALCIUM 20 MG PO TABS: 20.0000 mg | ORAL_TABLET | Freq: Every day | ORAL | 1 refills | Status: DC

## 2020-07-06 NOTE — Assessment & Plan Note (Signed)
Under good control on current regimen. Continue current regimen. Continue to monitor. Call with any concerns. Declines inhalers.

## 2020-07-06 NOTE — Assessment & Plan Note (Signed)
Rechecking labs today. Await results.  

## 2020-07-06 NOTE — Assessment & Plan Note (Signed)
Under good control on current regimen. Continue current regimen. Continue to monitor. Call with any concerns. Refills given. Labs drawn today.   

## 2020-07-06 NOTE — Assessment & Plan Note (Signed)
Will keep BP and cholesterol under good control. Continue to monitor. Call with any concerns.  

## 2020-07-06 NOTE — Assessment & Plan Note (Signed)
Running high. Will increase her lisinopril to 30mg  and recheck 1 month.

## 2020-07-06 NOTE — Patient Instructions (Signed)
Health Maintenance After Age 69 After age 69, you are at a higher risk for certain long-term diseases and infections as well as injuries from falls. Falls are a major cause of broken bones and head injuries in people who are older than age 69. Getting regular preventive care can help to keep you healthy and well. Preventive care includes getting regular testing and making lifestyle changes as recommended by your health care provider. Talk with your health care provider about:  Which screenings and tests you should have. A screening is a test that checks for a disease when you have no symptoms.  A diet and exercise plan that is right for you. What should I know about screenings and tests to prevent falls? Screening and testing are the best ways to find a health problem early. Early diagnosis and treatment give you the best chance of managing medical conditions that are common after age 69. Certain conditions and lifestyle choices may make you more likely to have a fall. Your health care provider may recommend:  Regular vision checks. Poor vision and conditions such as cataracts can make you more likely to have a fall. If you wear glasses, make sure to get your prescription updated if your vision changes.  Medicine review. Work with your health care provider to regularly review all of the medicines you are taking, including over-the-counter medicines. Ask your health care provider about any side effects that may make you more likely to have a fall. Tell your health care provider if any medicines that you take make you feel dizzy or sleepy.  Osteoporosis screening. Osteoporosis is a condition that causes the bones to get weaker. This can make the bones weak and cause them to break more easily.  Blood pressure screening. Blood pressure changes and medicines to control blood pressure can make you feel dizzy.  Strength and balance checks. Your health care provider may recommend certain tests to check your  strength and balance while standing, walking, or changing positions.  Foot health exam. Foot pain and numbness, as well as not wearing proper footwear, can make you more likely to have a fall.  Depression screening. You may be more likely to have a fall if you have a fear of falling, feel emotionally low, or feel unable to do activities that you used to do.  Alcohol use screening. Using too much alcohol can affect your balance and may make you more likely to have a fall. What actions can I take to lower my risk of falls? General instructions  Talk with your health care provider about your risks for falling. Tell your health care provider if: ? You fall. Be sure to tell your health care provider about all falls, even ones that seem minor. ? You feel dizzy, sleepy, or off-balance.  Take over-the-counter and prescription medicines only as told by your health care provider. These include any supplements.  Eat a healthy diet and maintain a healthy weight. A healthy diet includes low-fat dairy products, low-fat (lean) meats, and fiber from whole grains, beans, and lots of fruits and vegetables. Home safety  Remove any tripping hazards, such as rugs, cords, and clutter.  Install safety equipment such as grab bars in bathrooms and safety rails on stairs.  Keep rooms and walkways well-lit. Activity  Follow a regular exercise program to stay fit. This will help you maintain your balance. Ask your health care provider what types of exercise are appropriate for you.  If you need a cane or walker,   use it as recommended by your health care provider.  Wear supportive shoes that have nonskid soles.   Lifestyle  Do not drink alcohol if your health care provider tells you not to drink.  If you drink alcohol, limit how much you have: ? 0-1 drink a day for women. ? 0-2 drinks a day for men.  Be aware of how much alcohol is in your drink. In the [place] one drink equals one typical bottle of beer (12  oz), one-half glass of wine (5 oz), or one shot of hard liquor (1 oz).  Do not use any products that contain nicotine or tobacco, such as cigarettes and e-cigarettes. If you need help quitting, ask your health care provider. Summary  Having a healthy lifestyle and getting preventive care can help to protect your health and wellness after age 69.  Screening and testing are the best way to find a health problem early and help you avoid having a fall. Early diagnosis and treatment give you the best chance for managing medical conditions that are more common for people who are older than age 69.  Falls are a major cause of broken bones and head injuries in people who are older than age 69. Take precautions to prevent a fall at home.  Work with your health care provider to learn what changes you can make to improve your health and wellness and to prevent falls. This information is not intended to replace advice given to you by your health care provider. Make sure you discuss any questions you have with your health care provider. Document Revised: 09/13/2018 Document Reviewed: 04/05/2017 Elsevier Patient Education  2021 [employer]  

## 2020-07-06 NOTE — Assessment & Plan Note (Signed)
Due for recheck at the end of the year. Checking labs today. Await results.

## 2020-07-07 LAB — COMPREHENSIVE METABOLIC PANEL
ALT: 20 IU/L (ref 0–32)
AST: 22 IU/L (ref 0–40)
Albumin/Globulin Ratio: 1.9 (ref 1.2–2.2)
Albumin: 3.8 g/dL (ref 3.8–4.8)
Alkaline Phosphatase: 85 IU/L (ref 44–121)
BUN/Creatinine Ratio: 8 — ABNORMAL LOW (ref 12–28)
BUN: 8 mg/dL (ref 8–27)
Bilirubin Total: 0.5 mg/dL (ref 0.0–1.2)
CO2: 24 mmol/L (ref 20–29)
Calcium: 8.7 mg/dL (ref 8.7–10.3)
Chloride: 105 mmol/L (ref 96–106)
Creatinine, Ser: 1.01 mg/dL — ABNORMAL HIGH (ref 0.57–1.00)
GFR calc Af Amer: 66 mL/min/{1.73_m2} (ref >59)
GFR calc non Af Amer: 57 mL/min/{1.73_m2} — ABNORMAL LOW (ref >59)
Globulin, Total: 2 g/dL (ref 1.5–4.5)
Glucose: 85 mg/dL (ref 65–99)
Potassium: 3.7 mmol/L (ref 3.5–5.2)
Sodium: 142 mmol/L (ref 134–144)
Total Protein: 5.8 g/dL — ABNORMAL LOW (ref 6.0–8.5)

## 2020-07-07 LAB — CBC WITH DIFFERENTIAL/PLATELET
Basophils Absolute: 0 10*3/uL (ref 0.0–0.2)
Basos: 1 %
EOS (ABSOLUTE): 0.1 10*3/uL (ref 0.0–0.4)
Eos: 2 %
Hematocrit: 41.8 % (ref 34.0–46.6)
Hemoglobin: 13.4 g/dL (ref 11.1–15.9)
Immature Grans (Abs): 0 10*3/uL (ref 0.0–0.1)
Immature Granulocytes: 0 %
Lymphocytes Absolute: 1 10*3/uL (ref 0.7–3.1)
Lymphs: 32 %
MCH: 27.3 pg (ref 26.6–33.0)
MCHC: 32.1 g/dL (ref 31.5–35.7)
MCV: 85 fL (ref 79–97)
Monocytes Absolute: 0.4 10*3/uL (ref 0.1–0.9)
Monocytes: 14 %
Neutrophils Absolute: 1.6 10*3/uL (ref 1.4–7.0)
Neutrophils: 51 %
Platelets: 166 10*3/uL (ref 150–450)
RBC: 4.9 x10E6/uL (ref 3.77–5.28)
RDW: 13.6 % (ref 11.7–15.4)
WBC: 3.1 10*3/uL — ABNORMAL LOW (ref 3.4–10.8)

## 2020-07-07 LAB — TSH: TSH: 6.71 u[IU]/mL — ABNORMAL HIGH (ref 0.450–4.500)

## 2020-07-07 LAB — LIPID PANEL W/O CHOL/HDL RATIO
Cholesterol, Total: 121 mg/dL (ref 100–199)
HDL: 39 mg/dL — ABNORMAL LOW (ref >39)
LDL Chol Calc (NIH): 68 mg/dL (ref 0–99)
Triglycerides: 69 mg/dL (ref 0–149)
VLDL Cholesterol Cal: 14 mg/dL (ref 5–40)

## 2020-07-07 LAB — VITAMIN D 25 HYDROXY (VIT D DEFICIENCY, FRACTURES): Vit D, 25-Hydroxy: 40.7 ng/mL (ref 30.0–100.0)

## 2020-07-08 ENCOUNTER — Other Ambulatory Visit: Payer: Self-pay | Admitting: Family Medicine

## 2020-07-08 DIAGNOSIS — R7989 Other specified abnormal findings of blood chemistry: Secondary | ICD-10-CM

## 2020-08-04 ENCOUNTER — Encounter: Payer: Self-pay | Admitting: Family Medicine

## 2020-08-04 ENCOUNTER — Other Ambulatory Visit: Payer: Self-pay

## 2020-08-04 ENCOUNTER — Ambulatory Visit (INDEPENDENT_AMBULATORY_CARE_PROVIDER_SITE_OTHER): Payer: Medicare Other | Admitting: Family Medicine

## 2020-08-04 VITALS — BP 132/88 | HR 76 | Temp 97.8°F | Wt 153.4 lb

## 2020-08-04 DIAGNOSIS — I129 Hypertensive chronic kidney disease with stage 1 through stage 4 chronic kidney disease, or unspecified chronic kidney disease: Secondary | ICD-10-CM

## 2020-08-04 DIAGNOSIS — R7989 Other specified abnormal findings of blood chemistry: Secondary | ICD-10-CM

## 2020-08-04 NOTE — Assessment & Plan Note (Addendum)
Under good control on current regimen. Continue current regimen. Continue to monitor. Call with any concerns. Refills up to date. Labs drawn today.  

## 2020-08-04 NOTE — Patient Instructions (Signed)
Good blood pressures: between 100/60 and 139/89

## 2020-08-04 NOTE — Progress Notes (Signed)
BP 132/88   Pulse 76   Temp 97.8 F (36.6 C)   Wt 153 lb 6.4 oz (69.6 kg)   SpO2 97%   BMI 27.77 kg/m    Subjective:    Patient ID: Jasmine Church, female    DOB: [birth year], 70 y.o.   MRN: [id number]  HPI: Jasmine Church is a 70 y.o. female  Chief Complaint  Patient presents with  . Hypertension   HYPERTENSION Hypertension status: controlled  Satisfied with current treatment? yes Duration of hypertension: chronic BP monitoring frequency:  daily BP range: lisinopril BP medication side effects:  no Medication compliance: excellent compliance Previous BP meds: lisinopril Aspirin: yes Recurrent headaches: no Visual changes: no Palpitations: no Dyspnea: no Chest pain: no Lower extremity edema: no Dizzy/lightheaded: no  Relevant past medical, surgical, family and social history reviewed and updated as indicated. Interim medical history since our last visit reviewed. Allergies and medications reviewed and updated.  Review of Systems  Constitutional: Negative.   Respiratory: Negative.   Cardiovascular: Negative.   Gastrointestinal: Negative.   Musculoskeletal: Negative.   Neurological: Negative.   Psychiatric/Behavioral: Negative.     Per HPI unless specifically indicated above     Objective:    BP 132/88   Pulse 76   Temp 97.8 F (36.6 C)   Wt 153 lb 6.4 oz (69.6 kg)   SpO2 97%   BMI 27.77 kg/m   Wt Readings from Last 3 Encounters:  08/04/20 153 lb 6.4 oz (69.6 kg)  07/06/20 152 lb 8 oz (69.2 kg)  12/31/19 155 lb 12.8 oz (70.7 kg)    Physical Exam Vitals and nursing note reviewed.  Constitutional:      General: She is not in acute distress.    Appearance: Normal appearance. She is not ill-appearing, toxic-appearing or diaphoretic.  HENT:     Head: Normocephalic and atraumatic.     Right Ear: External ear normal.     Left Ear: External ear normal.     Nose: Nose normal.     Mouth/Throat:     Mouth: Mucous membranes are moist.      Pharynx: Oropharynx is clear.  Eyes:     General: No scleral icterus.       Right eye: No discharge.        Left eye: No discharge.     Extraocular Movements: Extraocular movements intact.     Conjunctiva/sclera: Conjunctivae normal.     Pupils: Pupils are equal, round, and reactive to light.  Cardiovascular:     Rate and Rhythm: Normal rate and regular rhythm.     Pulses: Normal pulses.     Heart sounds: Normal heart sounds. No murmur heard. No friction rub. No gallop.   Pulmonary:     Effort: Pulmonary effort is normal. No respiratory distress.     Breath sounds: Normal breath sounds. No stridor. No wheezing, rhonchi or rales.  Chest:     Chest wall: No tenderness.  Musculoskeletal:        General: Normal range of motion.     Cervical back: Normal range of motion and neck supple.  Skin:    General: Skin is warm and dry.     Capillary Refill: Capillary refill takes less than 2 seconds.     Coloration: Skin is not jaundiced or pale.     Findings: No bruising, erythema, lesion or rash.  Neurological:     General: No focal deficit present.     Mental Status: She  is alert and oriented to person, place, and time. Mental status is at baseline.  Psychiatric:        Mood and Affect: Mood normal.        Behavior: Behavior normal.        Thought Content: Thought content normal.        Judgment: Judgment normal.     Results for orders placed or performed in visit on 07/06/20  Microscopic Examination   Urine  Result Value Ref Range   WBC, UA None seen 0 - 5 /hpf   RBC 0-2 0 - 2 /hpf   Epithelial Cells (non renal) 0-10 0 - 10 /hpf   Bacteria, UA None seen None seen/Few  CBC with Differential/Platelet  Result Value Ref Range   WBC 3.1 (L) 3.4 - 10.8 x10E3/uL   RBC 4.90 3.77 - 5.28 x10E6/uL   Hemoglobin 13.4 11.1 - 15.9 g/dL   Hematocrit 41.8 34.0 - 46.6 %   MCV 85 79 - 97 fL   MCH 27.3 26.6 - 33.0 pg   MCHC 32.1 31.5 - 35.7 g/dL   RDW 13.6 11.7 - 15.4 %   Platelets 166 150 -  450 x10E3/uL   Neutrophils 51 Not Estab. %   Lymphs 32 Not Estab. %   Monocytes 14 Not Estab. %   Eos 2 Not Estab. %   Basos 1 Not Estab. %   Neutrophils Absolute 1.6 1.4 - 7.0 x10E3/uL   Lymphocytes Absolute 1.0 0.7 - 3.1 x10E3/uL   Monocytes Absolute 0.4 0.1 - 0.9 x10E3/uL   EOS (ABSOLUTE) 0.1 0.0 - 0.4 x10E3/uL   Basophils Absolute 0.0 0.0 - 0.2 x10E3/uL   Immature Granulocytes 0 Not Estab. %   Immature Grans (Abs) 0.0 0.0 - 0.1 x10E3/uL  Comprehensive metabolic panel  Result Value Ref Range   Glucose 85 65 - 99 mg/dL   BUN 8 8 - 27 mg/dL   Creatinine, Ser 1.01 (H) 0.57 - 1.00 mg/dL   GFR calc non Af Amer 57 (L) >59 mL/min/1.73   GFR calc Af Amer 66 >59 mL/min/1.73   BUN/Creatinine Ratio 8 (L) 12 - 28   Sodium 142 134 - 144 mmol/L   Potassium 3.7 3.5 - 5.2 mmol/L   Chloride 105 96 - 106 mmol/L   CO2 24 20 - 29 mmol/L   Calcium 8.7 8.7 - 10.3 mg/dL   Total Protein 5.8 (L) 6.0 - 8.5 g/dL   Albumin 3.8 3.8 - 4.8 g/dL   Globulin, Total 2.0 1.5 - 4.5 g/dL   Albumin/Globulin Ratio 1.9 1.2 - 2.2   Bilirubin Total 0.5 0.0 - 1.2 mg/dL   Alkaline Phosphatase 85 44 - 121 IU/L   AST 22 0 - 40 IU/L   ALT 20 0 - 32 IU/L  Lipid Panel w/o Chol/HDL Ratio  Result Value Ref Range   Cholesterol, Total 121 100 - 199 mg/dL   Triglycerides 69 0 - 149 mg/dL   HDL 39 (L) >39 mg/dL   VLDL Cholesterol Cal 14 5 - 40 mg/dL   LDL Chol Calc (NIH) 68 0 - 99 mg/dL  Microalbumin, Urine Waived  Result Value Ref Range   Microalb, Ur Waived 30 (H) 0 - 19 mg/L   Creatinine, Urine Waived 200 10 - 300 mg/dL   Microalb/Creat Ratio <30 <30 mg/g  TSH  Result Value Ref Range   TSH 6.710 (H) 0.450 - 4.500 uIU/mL  Urinalysis, Routine w reflex microscopic  Result Value Ref Range   Specific Gravity,  UA 1.025 1.005 - 1.030   pH, UA 5.0 5.0 - 7.5   Color, UA Yellow Yellow   Appearance Ur Clear Clear   Leukocytes,UA Negative Negative   Protein,UA Negative Negative/Trace   Glucose, UA Negative Negative    Ketones, UA 1+ (A) Negative   RBC, UA 2+ (A) Negative   Bilirubin, UA Negative Negative   Urobilinogen, Ur 0.2 0.2 - 1.0 mg/dL   Nitrite, UA Negative Negative   Microscopic Examination See below:   VITAMIN D 25 Hydroxy (Vit-D Deficiency, Fractures)  Result Value Ref Range   Vit D, 25-Hydroxy 40.7 30.0 - 100.0 ng/mL      Assessment & Plan:   Problem List Items Addressed This Visit      Genitourinary   Benign hypertensive renal disease - Primary    Under good control on current regimen. Continue current regimen. Continue to monitor. Call with any concerns. Refills up to date. Labs drawn today.       Relevant Orders   Basic metabolic panel    Other Visit Diagnoses    Abnormal thyroid blood test       Rechecking labs today.        Follow up plan: Return in about 5 months (around 01/04/2021).

## 2020-08-05 LAB — BASIC METABOLIC PANEL
BUN/Creatinine Ratio: 11 — ABNORMAL LOW (ref 12–28)
BUN: 12 mg/dL (ref 8–27)
CO2: 22 mmol/L (ref 20–29)
Calcium: 9.4 mg/dL (ref 8.7–10.3)
Chloride: 105 mmol/L (ref 96–106)
Creatinine, Ser: 1.06 mg/dL — ABNORMAL HIGH (ref 0.57–1.00)
Glucose: 81 mg/dL (ref 65–99)
Potassium: 4.4 mmol/L (ref 3.5–5.2)
Sodium: 144 mmol/L (ref 134–144)
eGFR: 57 mL/min/{1.73_m2} — ABNORMAL LOW (ref >59)

## 2020-08-05 LAB — TSH: TSH: 2.95 u[IU]/mL (ref 0.450–4.500)

## 2020-08-06 ENCOUNTER — Ambulatory Visit: Payer: Self-pay

## 2020-08-07 ENCOUNTER — Ambulatory Visit (INDEPENDENT_AMBULATORY_CARE_PROVIDER_SITE_OTHER): Payer: Medicare Other

## 2020-08-07 VITALS — Ht 62.0 in | Wt 153.0 lb

## 2020-08-07 DIAGNOSIS — Z0000 Encounter for general adult medical examination without abnormal findings: Secondary | ICD-10-CM

## 2020-08-07 NOTE — Patient Instructions (Signed)
Jasmine Church , Thank you for taking time to come for your Medicare Wellness Visit. I appreciate your ongoing commitment to your health goals. Please review the following plan we discussed and let me know if I can assist you in the future.   Screening recommendations/referrals: Colonoscopy: cologuard 04/12/2018, due 04/12/2021 Mammogram: completed 02/21/2020 Bone Density: completed 06/05/2018 Recommended yearly ophthalmology/optometry visit for glaucoma screening and checkup Recommended yearly dental visit for hygiene and checkup  Vaccinations: Influenza vaccine: completed 03/02/2020, due 01/04/2021 Pneumococcal vaccine: due Tdap vaccine: completed 03/02/2016, due 03/02/2026 Shingles vaccine: discussed   Covid-19:decline  Advanced directives: Advance directive discussed with you today. Even though you declined this today please call our office should you change your mind and we can give you the proper paperwork for you to fill out.  Conditions/risks identified: none  Next appointment: Follow up in one year for your annual wellness visit    Preventive Care 65 Years and Older, Female Preventive care refers to lifestyle choices and visits with your health care provider that can promote health and wellness. What does preventive care include?  A yearly physical exam. This is also called an annual well check.  Dental exams once or twice a year.  Routine eye exams. Ask your health care provider how often you should have your eyes checked.  Personal lifestyle choices, including:  Daily care of your teeth and gums.  Regular physical activity.  Eating a healthy diet.  Avoiding tobacco and drug use.  Limiting alcohol use.  Practicing safe sex.  Taking low-dose aspirin every day.  Taking vitamin and mineral supplements as recommended by your health care provider. What happens during an annual well check? The services and screenings done by your health care provider during your annual  well check will depend on your age, overall health, lifestyle risk factors, and family history of disease. Counseling  Your health care provider may ask you questions about your:  Alcohol use.  Tobacco use.  Drug use.  Emotional well-being.  Home and relationship well-being.  Sexual activity.  Eating habits.  History of falls.  Memory and ability to understand (cognition).  Work and work Statistician.  Reproductive health. Screening  You may have the following tests or measurements:  Height, weight, and BMI.  Blood pressure.  Lipid and cholesterol levels. These may be checked every 5 years, or more frequently if you are over 9 years old.  Skin check.  Lung cancer screening. You may have this screening every year starting at age 29 if you have a 30-pack-year history of smoking and currently smoke or have quit within the past 15 years.  Fecal occult blood test (FOBT) of the stool. You may have this test every year starting at age 57.  Flexible sigmoidoscopy or colonoscopy. You may have a sigmoidoscopy every 5 years or a colonoscopy every 10 years starting at age 76.  Hepatitis C blood test.  Hepatitis B blood test.  Sexually transmitted disease (STD) testing.  Diabetes screening. This is done by checking your blood sugar (glucose) after you have not eaten for a while (fasting). You may have this done every 1-3 years.  Bone density scan. This is done to screen for osteoporosis. You may have this done starting at age 29.  Mammogram. This may be done every 1-2 years. Talk to your health care provider about how often you should have regular mammograms. Talk with your health care provider about your test results, treatment options, and if necessary, the need for more tests.  Vaccines  Your health care provider may recommend certain vaccines, such as:  Influenza vaccine. This is recommended every year.  Tetanus, diphtheria, and acellular pertussis (Tdap, Td) vaccine.  You may need a Td booster every 10 years.  Zoster vaccine. You may need this after age 76.  Pneumococcal 13-valent conjugate (PCV13) vaccine. One dose is recommended after age 13.  Pneumococcal polysaccharide (PPSV23) vaccine. One dose is recommended after age 77. Talk to your health care provider about which screenings and vaccines you need and how often you need them. This information is not intended to replace advice given to you by your health care provider. Make sure you discuss any questions you have with your health care provider. Document Released: 06/19/2015 Document Revised: 02/10/2016 Document Reviewed: 03/24/2015 Elsevier Interactive Patient Education  2017 [employer] Prevention in the Home Falls can cause injuries. They can happen to people of all ages. There are many things you can do to make your home safe and to help prevent falls. What can I do on the outside of my home?  Regularly fix the edges of walkways and driveways and fix any cracks.  Remove anything that might make you trip as you walk through a door, such as a raised step or threshold.  Trim any bushes or trees on the path to your home.  Use bright outdoor lighting.  Clear any walking paths of anything that might make someone trip, such as rocks or tools.  Regularly check to see if handrails are loose or broken. Make sure that both sides of any steps have handrails.  Any raised decks and porches should have guardrails on the edges.  Have any leaves, snow, or ice cleared regularly.  Use sand or salt on walking paths during winter.  Clean up any spills in your garage right away. This includes oil or grease spills. What can I do in the bathroom?  Use night lights.  Install grab bars by the toilet and in the tub and shower. Do not use towel bars as grab bars.  Use non-skid mats or decals in the tub or shower.  If you need to sit down in the shower, use a plastic, non-slip stool.  Keep the  floor dry. Clean up any water that spills on the floor as soon as it happens.  Remove soap buildup in the tub or shower regularly.  Attach bath mats securely with double-sided non-slip rug tape.  Do not have throw rugs and other things on the floor that can make you trip. What can I do in the bedroom?  Use night lights.  Make sure that you have a light by your bed that is easy to reach.  Do not use any sheets or blankets that are too big for your bed. They should not hang down onto the floor.  Have a firm chair that has side arms. You can use this for support while you get dressed.  Do not have throw rugs and other things on the floor that can make you trip. What can I do in the kitchen?  Clean up any spills right away.  Avoid walking on wet floors.  Keep items that you use a lot in easy-to-reach places.  If you need to reach something above you, use a strong step stool that has a grab bar.  Keep electrical cords out of the way.  Do not use floor polish or wax that makes floors slippery. If you must use wax, use non-skid floor wax.  Do not have throw rugs and other things on the floor that can make you trip. What can I do with my stairs?  Do not leave any items on the stairs.  Make sure that there are handrails on both sides of the stairs and use them. Fix handrails that are broken or loose. Make sure that handrails are as long as the stairways.  Check any carpeting to make sure that it is firmly attached to the stairs. Fix any carpet that is loose or worn.  Avoid having throw rugs at the top or bottom of the stairs. If you do have throw rugs, attach them to the floor with carpet tape.  Make sure that you have a light switch at the top of the stairs and the bottom of the stairs. If you do not have them, ask someone to add them for you. What else can I do to help prevent falls?  Wear shoes that:  Do not have high heels.  Have rubber bottoms.  Are comfortable and fit  you well.  Are closed at the toe. Do not wear sandals.  If you use a stepladder:  Make sure that it is fully opened. Do not climb a closed stepladder.  Make sure that both sides of the stepladder are locked into place.  Ask someone to hold it for you, if possible.  Clearly mark and make sure that you can see:  Any grab bars or handrails.  First and last steps.  Where the edge of each step is.  Use tools that help you move around (mobility aids) if they are needed. These include:  Canes.  Walkers.  Scooters.  Crutches.  Turn on the lights when you go into a dark area. Replace any light bulbs as soon as they burn out.  Set up your furniture so you have a clear path. Avoid moving your furniture around.  If any of your floors are uneven, fix them.  If there are any pets around you, be aware of where they are.  Review your medicines with your doctor. Some medicines can make you feel dizzy. This can increase your chance of falling. Ask your doctor what other things that you can do to help prevent falls. This information is not intended to replace advice given to you by your health care provider. Make sure you discuss any questions you have with your health care provider. Document Released: 03/19/2009 Document Revised: 10/29/2015 Document Reviewed: 06/27/2014 Elsevier Interactive Patient Education  2017 [employer].

## 2020-08-07 NOTE — Progress Notes (Signed)
I connected with Jasmine Church today by telephone and verified that I am speaking with the correct person using two identifiers. Location patient: home Location provider: work Persons participating in the virtual visit: Gloriajean, [place] LPN.   I discussed the limitations, risks, security and privacy concerns of performing an evaluation and management service by telephone and the availability of in person appointments. I also discussed with the patient that there may be a patient responsible charge related to this service. The patient expressed understanding and verbally consented to this telephonic visit.    Interactive audio and video telecommunications were attempted between this provider and patient, however failed, due to patient having technical difficulties OR patient did not have access to video capability.  We continued and completed visit with audio only.     Vital signs may be patient reported or missing.  Subjective:   Jasmine Church is a 70 y.o. female who presents for Medicare Annual (Subsequent) preventive examination.  Review of Systems     Cardiac Risk Factors include: advanced age (>69men, >22 women);dyslipidemia;hypertension;sedentary lifestyle     Objective:    Today's Vitals   08/07/20 1426  Weight: 153 lb (69.4 kg)  Height: 5\' 2"  (1.575 m)   Body mass index is 27.98 kg/m.  Advanced Directives 08/07/2020 08/05/2019  Does Patient Have a Medical Advance Directive? No No    Current Medications (verified) Outpatient Encounter Medications as of 08/07/2020  Medication Sig  . aspirin EC 81 MG tablet Take 81 mg by mouth daily.  Marland Kitchen atorvastatin (LIPITOR) 20 MG tablet Take 1 tablet (20 mg total) by mouth at bedtime.  . Calcium Carbonate-Vitamin D 500-125 MG-UNIT TABS Take by mouth daily. Patient takes it only 3 days a week.  . clobetasol cream (TEMOVATE) 0.99 % Apply 1 application topically 2 (two) times a week.  Marland Kitchen lisinopril (ZESTRIL) 30 MG tablet Take 1  tablet (30 mg total) by mouth daily.  Marland Kitchen omeprazole (PRILOSEC) 20 MG capsule Take 1 capsule (20 mg total) by mouth daily.  . polyethylene glycol powder (GLYCOLAX/MIRALAX) powder Take 17 g by mouth 3 (three) times daily as needed.   No facility-administered encounter medications on file as of 08/07/2020.    Allergies (verified) Patient has no known allergies.   History: Past Medical History:  Diagnosis Date  . Arthritis   . CKD (chronic kidney disease) stage 3, GFR 30-59 ml/min (HCC)   . Hyperlipidemia   . Hypertension   . Vaginal atrophy    Past Surgical History:  Procedure Laterality Date  . HEMORROIDECTOMY    . HERNIA REPAIR     Family History  Problem Relation Age of Onset  . Arthritis Mother   . Hyperlipidemia Mother   . Hypertension Mother   . Heart disease Father   . Hypertension Father   . Heart attack Father   . Cancer Sister        uterus  . Hypertension Sister   . Heart disease Brother   . Heart attack Brother   . Hypertension Brother   . Stroke Maternal Grandmother   . Heart attack Brother   . Hypertension Brother   . Diabetes Neg Hx   . Breast cancer Neg Hx    Social History   Socioeconomic History  . Marital status: Married    Spouse name: Not on file  . Number of children: Not on file  . Years of education: Not on file  . Highest education level: Not on file  Occupational History  .  Not on file  Tobacco Use  . Smoking status: Former Smoker    Packs/day: 1.00    Years: 33.00    Pack years: 33.00    Types: Cigarettes    Quit date: 06/06/2004    Years since quitting: 16.1  . Smokeless tobacco: Never Used  Vaping Use  . Vaping Use: Never used  Substance and Sexual Activity  . Alcohol use: No  . Drug use: No  . Sexual activity: Never  Other Topics Concern  . Not on file  Social History Narrative  . Not on file   Social Determinants of Health   Financial Resource Strain: Low Risk   . Difficulty of Paying Living Expenses: Not hard at all   Food Insecurity: No Food Insecurity  . Worried About Charity fundraiser in the Last Year: Never true  . Ran Out of Food in the Last Year: Never true  Transportation Needs: No Transportation Needs  . Lack of Transportation (Medical): No  . Lack of Transportation (Non-Medical): No  Physical Activity: Inactive  . Days of Exercise per Week: 0 days  . Minutes of Exercise per Session: 0 min  Stress: No Stress Concern Present  . Feeling of Stress : Not at all  Social Connections: Not on file    Tobacco Counseling Counseling given: Not Answered   Clinical Intake:  Pre-visit preparation completed: Yes  Pain : No/denies pain     Nutritional Status: BMI 25 -29 Overweight Nutritional Risks: None Diabetes: No  How often do you need to have someone help you when you read instructions, pamphlets, or other written materials from your doctor or pharmacy?: 1 - Never What is the last grade level you completed in school?: 12th grade  Diabetic? no  Interpreter Needed?: No  Information entered by :: NAllen LPN   Activities of Daily Living In your present state of health, do you have any difficulty performing the following activities: 08/07/2020 07/06/2020  Hearing? N N  Vision? N N  Difficulty concentrating or making decisions? N N  Walking or climbing stairs? N N  Dressing or bathing? N N  Doing errands, shopping? N N  Preparing Food and eating ? N -  Using the Toilet? N -  In the past six months, have you accidently leaked urine? Y -  Do you have problems with loss of bowel control? N -  Managing your Medications? N -  Managing your Finances? N -  Housekeeping or managing your Housekeeping? N -  Some recent data might be hidden    Patient Care Team: Valerie Roys, DO as PCP - General (Family Medicine)  Indicate any recent Medical Services you may have received from other than [facility] providers in the past year (date may be approximate).     Assessment:   This is a routine  wellness examination for Jasmine Church.  Hearing/Vision screen  Hearing Screening   125Hz  250Hz  500Hz  1000Hz  2000Hz  3000Hz  4000Hz  6000Hz  8000Hz   Right ear:           Left ear:           Vision Screening Comments: Regular eye exams, Dr. Waldemar Dickens  Dietary issues and exercise activities discussed: Current Exercise Habits: The patient does not participate in regular exercise at present  Goals    . Patient Stated     08/07/2020, no goals      Depression Screen PHQ 2/9 Scores 08/07/2020 07/06/2020 08/05/2019 04/23/2019 03/07/2017 03/02/2016 03/02/2016  PHQ - 2 Score 0 0  0 0 0 0 0    Fall Risk Fall Risk  08/07/2020 07/06/2020 08/05/2019 04/23/2019 10/11/2018  Falls in the past year? 0 0 1 0 0  Number falls in past yr: - 0 0 0 -  Injury with Fall? - 0 1 0 -  Risk for fall due to : Medication side effect - - - -  Follow up Falls evaluation completed;Education provided;Falls prevention discussed - - - Falls evaluation completed    FALL RISK PREVENTION PERTAINING TO THE HOME:  Any stairs in or around the home? Yes  If so, are there any without handrails? No  Home free of loose throw rugs in walkways, pet beds, electrical cords, etc? Yes  Adequate lighting in your home to reduce risk of falls? Yes   ASSISTIVE DEVICES UTILIZED TO PREVENT FALLS:  Life alert? No  Use of a cane, walker or w/c? No  Grab bars in the bathroom? Yes  Shower chair or bench in shower? Yes  Elevated toilet seat or a handicapped toilet? No   TIMED UP AND GO:  Was the test performed? No .   Cognitive Function:     6CIT Screen 08/07/2020 04/23/2019 04/12/2018  What Year? 0 points 0 points 0 points  What month? 0 points 0 points 0 points  What time? 0 points 0 points 0 points  Count back from 20 0 points 0 points 0 points  Months in reverse 0 points 0 points 0 points  Repeat phrase 4 points 0 points 2 points  Total Score 4 0 2    Immunizations Immunization History  Administered Date(s) Administered  . Fluad Quad(high Dose 65+)  02/18/2019, 03/02/2020  . Influenza, High Dose Seasonal PF 03/12/2018  . Influenza,inj,Quad PF,6+ Mos 04/21/2016, 03/07/2017  . Pneumococcal Conjugate-13 03/06/2019  . Tdap 03/02/2016    TDAP status: Up to date  Flu Vaccine status: Up to date  Pneumococcal vaccine status: Due, Education has been provided regarding the importance of this vaccine. Advised may receive this vaccine at local pharmacy or Health Dept. Aware to provide a copy of the vaccination record if obtained from local pharmacy or Health Dept. Verbalized acceptance and understanding.  Covid-19 vaccine status: Completed vaccines  Qualifies for Shingles Vaccine? Yes   Zostavax completed No   Shingrix Completed?: No.    Education has been provided regarding the importance of this vaccine. Patient has been advised to call insurance company to determine out of pocket expense if they have not yet received this vaccine. Advised may also receive vaccine at local pharmacy or Health Dept. Verbalized acceptance and understanding.  Screening Tests Health Maintenance  Topic Date Due  . COVID-19 Vaccine (1) 08/20/2020 (Originally 04/14/1963)  . PNA vac Low Risk Adult (2 of 2 - PPSV23) 10/03/2020 (Originally 03/05/2020)  . Fecal DNA (Cologuard)  04/12/2021  . DEXA SCAN  06/05/2021  . MAMMOGRAM  02/20/2022  . TETANUS/TDAP  03/02/2026  . INFLUENZA VACCINE  Completed  . Hepatitis C Screening  Completed  . HPV VACCINES  Aged Out    Health Maintenance  There are no preventive care reminders to display for this patient.  Colorectal cancer screening: Type of screening: Cologuard. Completed 04/12/2018. Repeat every 3 years  Mammogram status: Completed 02/21/2020. Repeat every year  Bone Density status: Completed 06/05/2018.   Lung Cancer Screening: (Low Dose CT Chest recommended if Age 10-80 years, 30 pack-year currently smoking OR have quit w/in 15years.) does not qualify.   Lung Cancer Screening Referral: no  Additional  Screening:  Hepatitis C Screening: does qualify; Completed 03/07/2017  Vision Screening: Recommended annual ophthalmology exams for early detection of glaucoma and other disorders of the eye. Is the patient up to date with their annual eye exam?  Yes  Who is the provider or what is the name of the office in which the patient attends annual eye exams? Dr. Waldemar Dickens If pt is not established with a provider, would they like to be referred to a provider to establish care? No .   Dental Screening: Recommended annual dental exams for proper oral hygiene  Community Resource Referral / Chronic Care Management: CRR required this visit?  No   CCM required this visit?  No      Plan:     I have personally reviewed and noted the following in the patient's chart:   . Medical and social history . Use of alcohol, tobacco or illicit drugs  . Current medications and supplements . Functional ability and status . Nutritional status . Physical activity . Advanced directives . List of other physicians . Hospitalizations, surgeries, and ER visits in previous 12 months . Vitals . Screenings to include cognitive, depression, and falls . Referrals and appointments  In addition, I have reviewed and discussed with patient certain preventive protocols, quality metrics, and best practice recommendations. A written personalized care plan for preventive services as well as general preventive health recommendations were provided to patient.     Kellie Simmering, LPN   09/04/6604   Nurse Notes:

## 2020-09-01 DIAGNOSIS — I700 Atherosclerosis of aorta: Secondary | ICD-10-CM | POA: Insufficient documentation

## 2020-09-03 ENCOUNTER — Other Ambulatory Visit: Payer: Self-pay | Admitting: Oncology

## 2020-09-03 DIAGNOSIS — R911 Solitary pulmonary nodule: Secondary | ICD-10-CM

## 2020-09-03 NOTE — Progress Notes (Signed)
  Pulmonary Nodule Clinic Telephone Note [place]   Received referral from [place], LDCT screening navigator.  HPI: Ms. Sawatzky is a 70 year old female with past medical history significant for CAD, COPD, osteoporosis, hypertension, CKD, hyperlipidemia who was previously managed by our low-dose CT screening program who no longer meets criteria given she quit smoking greater than 15 years ago.  Her last low-dose CT scan was on 07/05/2019 which showed several small pulmonary nodules measuring as much as 4.3 mm.  There was also incidental finding of a 1.1 cm lesion of the liver.   Review and Recommendations: I personally reviewed all patient's previous imaging including LDCT scan from last year.  I recommend a repeat CT scan without contrast ASAP.  Social History: Tobacco Use: Medium Risk  . Smoking Tobacco Use: Former Smoker  . Smokeless Tobacco Use: Never Used   High risk factors include: History of heavy smoking, exposure to asbestos, radium or uranium, personal family history of lung cancer, older age, sex (females greater than males), race (black and native [place] greater than weight), marginal speculation, upper lobe location, multiplicity (less than 5 nodules increases risk for malignancy) and emphysema and/or pulmonary fibrosis.   This recommendation follows the consensus statement: Guidelines for Management of Incidental Pulmonary Nodules Detected on CT Images: From the Fleischner Society 2017; Radiology 2017; 284:228-243.    I have placed order for CT scan without contrast to be completed ASAP.   Disposition: Order placed for repeat CT chest. Will notify Lenox Ponds in scheduling. Powell to call patient with appointment date and time. Return to pulmonary nodule clinic a few days after his repeat imaging to discuss results and plan moving forward.  Faythe Casa, NP 09/03/2020 12:08 PM

## 2020-09-08 ENCOUNTER — Telehealth: Payer: Self-pay | Admitting: *Deleted

## 2020-09-08 NOTE — Telephone Encounter (Signed)
Spoke with pt to inform of previous CT results in LDCT program which requires follow up CT scan in the lung nodule clinic due to quitting smoking >15 years ago. Pt stated that she was not aware that she had a lung nodule. Clarification provided to patient and informed that can help schedule her follow up in the lung nodule clinic for further follow up. Pt stated that her husband is having a lot of appts at this time for his health and would like to call me back to reschedule her appts in the lung nodule clinic.

## 2020-09-14 ENCOUNTER — Other Ambulatory Visit: Payer: Medicare Other

## 2020-09-21 ENCOUNTER — Ambulatory Visit: Payer: Medicare Other | Admitting: Oncology

## 2020-11-20 ENCOUNTER — Other Ambulatory Visit: Payer: Self-pay | Admitting: Family Medicine

## 2020-12-09 ENCOUNTER — Ambulatory Visit (INDEPENDENT_AMBULATORY_CARE_PROVIDER_SITE_OTHER): Payer: Medicare Other | Admitting: Internal Medicine

## 2020-12-09 ENCOUNTER — Encounter: Payer: Self-pay | Admitting: Internal Medicine

## 2020-12-09 ENCOUNTER — Ambulatory Visit: Payer: Self-pay | Admitting: *Deleted

## 2020-12-09 ENCOUNTER — Ambulatory Visit: Payer: Self-pay | Admitting: Nurse Practitioner

## 2020-12-09 ENCOUNTER — Other Ambulatory Visit: Payer: Self-pay

## 2020-12-09 VITALS — BP 168/88 | HR 86 | Temp 98.2°F | Ht 61.58 in | Wt 158.2 lb

## 2020-12-09 DIAGNOSIS — I160 Hypertensive urgency: Secondary | ICD-10-CM

## 2020-12-09 MED ORDER — HYDROCHLOROTHIAZIDE 12.5 MG PO TABS: 12.5000 mg | ORAL_TABLET | Freq: Every day | ORAL | 1 refills | Status: DC

## 2020-12-09 MED ORDER — CLONIDINE HCL 0.1 MG PO TABS
0.1000 mg | ORAL_TABLET | Freq: Once | ORAL | Status: AC
  Administered 2020-12-09: 0.1 mg via ORAL

## 2020-12-09 NOTE — Telephone Encounter (Signed)
Summary: swollen feet   Pt called and stated that her feet are swollen and would like a call back. She states that she had a tooth pulled and was on an antibiotic and thinks this is the issue. Pt would like a call back.      Reason for Disposition  [1] MILD swelling of both ankles (i.e., pedal edema) AND [2] new-onset or worsening  Answer Assessment - Initial Assessment Questions 1. LOCATION: "Which ankle is swollen?" "Where is the swelling?"     Bilateral foot swelling- tops of feet and ankles 2. ONSET: "When did the swelling start?"     Friday- 7/1 3. SIZE: "How large is the swelling?"     Puffiness in skin 4. PAIN: "Is there any pain?" If Yes, ask: "How bad is it?" (Scale 1-10; or mild, moderate, severe)   - NONE (0): no pain.   - MILD (1-3): doesn't interfere with normal activities.    - MODERATE (4-7): interferes with normal activities (e.g., work or school) or awakens from sleep, limping.    - SEVERE (8-10): excruciating pain, unable to do any normal activities, unable to walk.      No pain- feels weird  5. CAUSE: "What do you think caused the ankle swelling?"     unsure 6. OTHER SYMPTOMS: "Do you have any other symptoms?" (e.g., fever, chest pain, difficulty breathing, calf pain)     Patient was being treated for tooth extraction with antibiotics, 7/4- 142/89, 7/6- 153/95 7. PREGNANCY: "Is there any chance you are pregnant?" "When was your last menstrual period?"     N/a  Answer Assessment - Initial Assessment Questions 1. ONSET: "When did the swelling start?" (e.g., minutes, hours, days)     Last Friday 2. LOCATION: "What part of the leg is swollen?"  "Are both legs swollen or just one leg?"     Bilateral feet swelling 3. SEVERITY: "How bad is the swelling?" (e.g., localized; mild, moderate, severe)  - Localized - small area of swelling localized to one leg  - MILD pedal edema - swelling limited to foot and ankle, pitting edema < 1/4 inch (6 mm) deep, rest and elevation  eliminate most or all swelling  - MODERATE edema - swelling of lower leg to knee, pitting edema > 1/4 inch (6 mm) deep, rest and elevation only partially reduce swelling  - SEVERE edema - swelling extends above knee, facial or hand swelling present      mild 4. REDNESS: "Does the swelling look red or infected?"     no 5. PAIN: "Is the swelling painful to touch?" If Yes, ask: "How painful is it?"   (Scale 1-10; mild, moderate or severe)     no 6. FEVER: "Do you have a fever?" If Yes, ask: "What is it, how was it measured, and when did it start?"      no 7. CAUSE: "What do you think is causing the leg swelling?"     unsure 8. MEDICAL HISTORY: "Do you have a history of heart failure, kidney disease, liver failure, or cancer?"     no 9. RECURRENT SYMPTOM: "Have you had leg swelling before?" If Yes, ask: "When was the last time?" "What happened that time?"     Yes- long travel- went away 10. OTHER SYMPTOMS: "Do you have any other symptoms?" (e.g., chest pain, difficulty breathing)       no 11. PREGNANCY: "Is there any chance you are pregnant?" "When was your last menstrual period?"  N/a  Protocols used: Ankle Swelling-A-AH, Leg Swelling and Edema-A-AH

## 2020-12-09 NOTE — Progress Notes (Signed)
BP (!) 198/106   Pulse 86   Temp 98.2 F (36.8 C) (Oral)   Ht 5' 1.58" (1.564 m)   Wt 158 lb 3.2 oz (71.8 kg)   SpO2 100%   BMI 29.34 kg/m    Subjective:    Patient ID: Jasmine Church, female    DOB: Jun 06, 1951, 69 y.o.   MRN: [id number]  Chief Complaint  Patient presents with   Edema    B/L Foot and ankle swelling since Saturday.     HPI: Jasmine Church is a 70 y.o. female  Patient presents with: Edema: B/L Foot and ankle swelling since Saturday.  Had tooth pulled on tueday last week and swelling started Saturday   Hypertension This is a chronic problem. The problem is uncontrolled. Associated symptoms include peripheral edema. Pertinent negatives include no anxiety, blurred vision, chest pain, headaches, malaise/fatigue, neck pain, orthopnea, palpitations, PND, shortness of breath or sweats.   Chief Complaint  Patient presents with   Edema    B/L Foot and ankle swelling since Saturday.     Relevant past medical, surgical, family and social history reviewed and updated as indicated. Interim medical history since our last visit reviewed. Allergies and medications reviewed and updated.  Review of Systems  Constitutional:  Negative for malaise/fatigue.  Eyes:  Negative for blurred vision.  Respiratory:  Negative for shortness of breath.   Cardiovascular:  Negative for chest pain, palpitations, orthopnea and PND.  Musculoskeletal:  Negative for neck pain.  Neurological:  Negative for headaches.   Per HPI unless specifically indicated above     Objective:    BP (!) 198/106   Pulse 86   Temp 98.2 F (36.8 C) (Oral)   Ht 5' 1.58" (1.564 m)   Wt 158 lb 3.2 oz (71.8 kg)   SpO2 100%   BMI 29.34 kg/m   Wt Readings from Last 3 Encounters:  12/09/20 158 lb 3.2 oz (71.8 kg)  08/07/20 153 lb (69.4 kg)  08/04/20 153 lb 6.4 oz (69.6 kg)    Physical Exam Vitals and nursing note reviewed.  Constitutional:      General: She is not in acute distress.     Appearance: Normal appearance. She is not ill-appearing or diaphoretic.  HENT:     Head: Normocephalic and atraumatic.     Right Ear: Tympanic membrane and external ear normal. There is no impacted cerumen.     Left Ear: External ear normal.     Nose: No congestion or rhinorrhea.     Mouth/Throat:     Pharynx: No oropharyngeal exudate or posterior oropharyngeal erythema.  Eyes:     Conjunctiva/sclera: Conjunctivae normal.     Pupils: Pupils are equal, round, and reactive to light.  Cardiovascular:     Rate and Rhythm: Normal rate and regular rhythm.     Heart sounds: No murmur heard.   No friction rub. No gallop.  Pulmonary:     Effort: No respiratory distress.     Breath sounds: No stridor. No wheezing or rhonchi.  Chest:     Chest wall: No tenderness.  Abdominal:     General: Abdomen is flat. Bowel sounds are normal. There is no distension.     Palpations: Abdomen is soft. There is no mass.     Tenderness: There is no abdominal tenderness. There is no guarding.  Musculoskeletal:        General: No swelling or deformity.     Cervical back: Normal range of motion and  neck supple. No rigidity or tenderness.     Right lower leg: No edema.     Left lower leg: No edema.  Skin:    General: Skin is warm and dry.     Coloration: Skin is not jaundiced.     Findings: No erythema.  Neurological:     Mental Status: She is alert and oriented to person, place, and time. Mental status is at baseline.  Psychiatric:        Mood and Affect: Mood normal.        Behavior: Behavior normal.        Thought Content: Thought content normal.        Judgment: Judgment normal.    Results for orders placed or performed in visit on 54/65/03  Basic metabolic panel  Result Value Ref Range   Glucose 81 65 - 99 mg/dL   BUN 12 8 - 27 mg/dL   Creatinine, Ser 1.06 (H) 0.57 - 1.00 mg/dL   eGFR 57 (L) >59 mL/min/1.73   BUN/Creatinine Ratio 11 (L) 12 - 28   Sodium 144 134 - 144 mmol/L   Potassium 4.4 3.5  - 5.2 mmol/L   Chloride 105 96 - 106 mmol/L   CO2 22 20 - 29 mmol/L   Calcium 9.4 8.7 - 10.3 mg/dL  TSH  Result Value Ref Range   TSH 2.950 0.450 - 4.500 uIU/mL        Current Outpatient Medications:    aspirin EC 81 MG tablet, Take 81 mg by mouth daily., Disp: , Rfl:    atorvastatin (LIPITOR) 20 MG tablet, Take 1 tablet (20 mg total) by mouth at bedtime., Disp: 90 tablet, Rfl: 1   Calcium Carbonate-Vitamin D 500-125 MG-UNIT TABS, Take by mouth daily. Patient takes it only 3 days a week., Disp: , Rfl:    clobetasol cream (TEMOVATE) 5.46 %, Apply 1 application topically 2 (two) times a week., Disp: 60 g, Rfl: 1   lisinopril (ZESTRIL) 30 MG tablet, TAKE 1 TABLET BY MOUTH  DAILY, Disp: 90 tablet, Rfl: 1   omeprazole (PRILOSEC) 20 MG capsule, Take 1 capsule (20 mg total) by mouth daily., Disp: 90 capsule, Rfl: 3   polyethylene glycol powder (GLYCOLAX/MIRALAX) powder, Take 17 g by mouth 3 (three) times daily as needed., Disp: 3350 g, Rfl: 1    Assessment & Plan:  Hypertensive urgency:  Will need to give pt Clonidine 0.1 mg x 1  Will start pt on HCTZ sec to Lower ext edema.  Might need ECHO  Will need to fu with pcp x 1 week.  Blood pressure down to 168/88 mmHg on rechecking after 40 minutes of clonidine administration.  Problem List Items Addressed This Visit   None    No orders of the defined types were placed in this encounter.    Meds ordered this encounter  Medications   hydrochlorothiazide (HYDRODIURIL) 12.5 MG tablet    Sig: Take 1 tablet (12.5 mg total) by mouth daily.    Dispense:  30 tablet    Refill:  1     Follow up plan: No follow-ups on file.

## 2020-12-09 NOTE — Telephone Encounter (Signed)
Patient is calling to report she has bilateral swelling in her feet and ankles that is not getting better. At first she thought it was related to antibiotic use because she is under treatment-just finished treatment for tooth extraction. Patient BP is slightly elevated and she states she has gained a few pounds- 158lb from 155lb. Appointment scheduled for evaluation of these new symptoms.

## 2020-12-09 NOTE — Telephone Encounter (Signed)
FYI pt scheduled today

## 2020-12-09 NOTE — Addendum Note (Signed)
Addended by: Donzetta Kohut A on: 12/09/2020 04:52 PM   Modules accepted: Orders

## 2020-12-10 ENCOUNTER — Telehealth: Payer: Self-pay

## 2020-12-10 NOTE — Telephone Encounter (Signed)
Patient notified

## 2020-12-10 NOTE — Telephone Encounter (Signed)
Please let her know to take both her meds and keep bp logs we are seeing her in 1 week for a fu, if continues to be high to call us. Bp goal < 140/90 mm hg This is better than yesterday and will go down slowly.   Please let he rknow that to  go to the ER if she develops chest pain , any new onset of shortness of breath , dizziness , worsening sever headaches or tingling or numbness. Thanks Tax adviser

## 2020-12-10 NOTE — Telephone Encounter (Signed)
You saw her yesterday

## 2020-12-10 NOTE — Telephone Encounter (Signed)
Copied from [place] [id number]. Topic: General - Other >> Dec 10, 2020 11:30 AM Holley Dexter N wrote: Reason for CRM: Pt called in to give BP results, pt states it is 162/86 after taking her medication.

## 2020-12-14 ENCOUNTER — Telehealth: Payer: Self-pay | Admitting: *Deleted

## 2020-12-14 ENCOUNTER — Ambulatory Visit: Payer: Self-pay | Admitting: *Deleted

## 2020-12-14 NOTE — Telephone Encounter (Signed)
Duplicate

## 2020-12-14 NOTE — Telephone Encounter (Signed)
Patient called in wanting to know should she stop fluid pill medication and her BP now reads 94/60. Please call back in regards to this  Called patient to review symptoms. No answer, LMTCB.

## 2020-12-15 NOTE — Telephone Encounter (Signed)
Attempted to call patient, no answer unable to leave VM. Will try again later.

## 2020-12-15 NOTE — Telephone Encounter (Signed)
Please assist

## 2020-12-15 NOTE — Telephone Encounter (Signed)
FYI

## 2020-12-15 NOTE — Telephone Encounter (Signed)
Patient called in asking Dr Neomia Dear or a nurse to please give her a call today because she has questions about taking her BP Medication and her fluid pills. Per patient her BP have been running low and the swelling in her feet is gone.  [place] Ph# [phone]

## 2020-12-15 NOTE — Telephone Encounter (Signed)
Attempted to contact patient and was unable to leave voice message as there was no voicemail. Phone kept ringing.

## 2020-12-15 NOTE — Telephone Encounter (Signed)
Pl have pt keep bp logs and let us know how she feels. She had a very high BP here at the office , she will need to increase water intake so as not to get dehydrated specially since [place] beach.  Will need to go over her logs and decide whether to d/c her meds or not.  Please let her know Thnx.

## 2020-12-16 NOTE — Telephone Encounter (Signed)
Attempted to call patient, no answer and unable to LVM. Will try again later.

## 2020-12-16 NOTE — Telephone Encounter (Signed)
Attempted to contact patient again and was unable to leave a voice message due to her answer machine being off and unable to leave a message.

## 2020-12-21 ENCOUNTER — Other Ambulatory Visit: Payer: Self-pay

## 2020-12-21 ENCOUNTER — Encounter: Payer: Self-pay | Admitting: Internal Medicine

## 2020-12-21 ENCOUNTER — Ambulatory Visit (INDEPENDENT_AMBULATORY_CARE_PROVIDER_SITE_OTHER): Payer: Medicare Other | Admitting: Internal Medicine

## 2020-12-21 ENCOUNTER — Ambulatory Visit: Payer: Self-pay

## 2020-12-21 VITALS — BP 165/105 | HR 62 | Temp 97.7°F | Ht 61.59 in | Wt 154.0 lb

## 2020-12-21 DIAGNOSIS — I10 Essential (primary) hypertension: Secondary | ICD-10-CM

## 2020-12-21 MED ORDER — LISINOPRIL 40 MG PO TABS: 40.0000 mg | ORAL_TABLET | Freq: Every day | ORAL | 3 refills | Status: DC

## 2020-12-21 MED ORDER — HYDRALAZINE HCL 25 MG PO TABS: 25.0000 mg | ORAL_TABLET | Freq: Two times a day (BID) | ORAL | 3 refills | Status: DC

## 2020-12-21 NOTE — Telephone Encounter (Signed)
Pt c/o labile BP. Last weekend pt had to stop the HCTZ due to 70/43. Pt stated she felt awful.  Today at the dentist her BP 224/99, 166/104. During the call pt's BP was 160/98. Pt's weight 153.5 and 155.5 lbs during the week. Pt denies any headache, blurred or double vision, chest pain. Pt stated her ankles are beginning to swell.  Care advice given and pt verbalized understanding.Pt has existing appt today with Dr Neomia Dear.           Reason for Disposition  [1] Taking BP medications AND [2] feels is having side effects (e.g., impotence, cough, dizzy upon standing)  Answer Assessment - Initial Assessment Questions 1. BLOOD PRESSURE: "What is the blood pressure?" "Did you take at least two measurements 5 minutes apart?"     151/100 at 0930; 139/91 at 0935;1000 166/104; 1045 224/99;1100:160/98 2. ONSET: "When did you take your blood pressure?"     See above 3. HOW: "How did you obtain the blood pressure?" (e.g., visiting nurse, automatic home BP monitor)     Auto BP machine 4. HISTORY: "Do you have a history of high blood pressure?"     yes 5. MEDICATIONS: "Are you taking any medications for blood pressure?" "Have you missed any doses recently?"     Yes- stopped HCTZ BP 70/43- felt like she was going to die 6. OTHER SYMPTOMS: "Do you have any symptoms?" (e.g., headache, chest pain, blurred vision, difficulty breathing, weakness)     Ankle swelling and hand swelling and axilla 7. PREGNANCY: "Is there any chance you are pregnant?" "When was your last menstrual period?"     N/a  Protocols used: Blood Pressure - High-A-AH

## 2020-12-21 NOTE — Progress Notes (Signed)
BP (!) 165/105   Pulse 62   Temp 97.7 F (36.5 C) (Oral)   Ht 5' 1.59" (1.564 m)   Wt 154 lb (69.9 kg)   SpO2 100%   BMI 28.54 kg/m    Subjective:    Patient ID: Jasmine Church, female    DOB: 1950-06-20, 70 y.o.   MRN: [id number]  Chief Complaint  Patient presents with  . Hypertension    HPI: Jasmine Church is a 70 y.o. female  Hypertension This is a chronic problem. The current episode started more than 1 month ago. The problem is controlled. Associated symptoms include peripheral edema. Pertinent negatives include no anxiety, blurred vision, chest pain, headaches, malaise/fatigue, neck pain, orthopnea, palpitations, PND, shortness of breath or sweats. ( Weight stays around 153 lbs -- 155  lbs  Swelling in lower extremity better per pt. )   Chief Complaint  Patient presents with  . Hypertension    Relevant past medical, surgical, family and social history reviewed and updated as indicated. Interim medical history since our last visit reviewed. Allergies and medications reviewed and updated.  Review of Systems  Constitutional:  Negative for malaise/fatigue.  Eyes:  Negative for blurred vision.  Respiratory:  Negative for shortness of breath.   Cardiovascular:  Negative for chest pain, palpitations, orthopnea and PND.  Musculoskeletal:  Negative for neck pain.  Neurological:  Negative for headaches.   Per HPI unless specifically indicated above     Objective:    BP (!) 165/105   Pulse 62   Temp 97.7 F (36.5 C) (Oral)   Ht 5' 1.59" (1.564 m)   Wt 154 lb (69.9 kg)   SpO2 100%   BMI 28.54 kg/m   Wt Readings from Last 3 Encounters:  12/21/20 154 lb (69.9 kg)  12/09/20 158 lb 3.2 oz (71.8 kg)  08/07/20 153 lb (69.4 kg)    Physical Exam Vitals and nursing note reviewed.  Constitutional:      General: She is not in acute distress.    Appearance: Normal appearance. She is not ill-appearing or diaphoretic.  HENT:     Head: Normocephalic and  atraumatic.     Right Ear: Tympanic membrane and external ear normal. There is no impacted cerumen.     Left Ear: External ear normal.     Nose: No congestion or rhinorrhea.     Mouth/Throat:     Pharynx: No oropharyngeal exudate or posterior oropharyngeal erythema.  Eyes:     Conjunctiva/sclera: Conjunctivae normal.     Pupils: Pupils are equal, round, and reactive to light.  Cardiovascular:     Rate and Rhythm: Normal rate and regular rhythm.     Heart sounds: No murmur heard.   No friction rub. No gallop.  Pulmonary:     Effort: No respiratory distress.     Breath sounds: No stridor. No wheezing or rhonchi.  Chest:     Chest wall: No tenderness.  Abdominal:     General: Abdomen is flat. Bowel sounds are normal. There is no distension.     Palpations: Abdomen is soft. There is no mass.     Tenderness: There is no abdominal tenderness. There is no guarding.  Musculoskeletal:        General: No swelling or deformity.     Cervical back: Normal range of motion and neck supple. No rigidity or tenderness.     Right lower leg: No edema.     Left lower leg: No edema.  Skin:    General: Skin is warm and dry.     Coloration: Skin is not jaundiced.     Findings: No erythema.  Neurological:     Mental Status: She is alert and oriented to person, place, and time. Mental status is at baseline.    Results for orders placed or performed in visit on 78/93/81  Basic metabolic panel  Result Value Ref Range   Glucose 81 65 - 99 mg/dL   BUN 12 8 - 27 mg/dL   Creatinine, Ser 1.06 (H) 0.57 - 1.00 mg/dL   eGFR 57 (L) >59 mL/min/1.73   BUN/Creatinine Ratio 11 (L) 12 - 28   Sodium 144 134 - 144 mmol/L   Potassium 4.4 3.5 - 5.2 mmol/L   Chloride 105 96 - 106 mmol/L   CO2 22 20 - 29 mmol/L   Calcium 9.4 8.7 - 10.3 mg/dL  TSH  Result Value Ref Range   TSH 2.950 0.450 - 4.500 uIU/mL        Current Outpatient Medications:  .  aspirin EC 81 MG tablet, Take 81 mg by mouth daily., Disp: ,  Rfl:  .  atorvastatin (LIPITOR) 20 MG tablet, Take 1 tablet (20 mg total) by mouth at bedtime., Disp: 90 tablet, Rfl: 1 .  Calcium Carbonate-Vitamin D 500-125 MG-UNIT TABS, Take by mouth daily. Patient takes it only 3 days a week., Disp: , Rfl:  .  clobetasol cream (TEMOVATE) 0.17 %, Apply 1 application topically 2 (two) times a week., Disp: 60 g, Rfl: 1 .  lisinopril (ZESTRIL) 30 MG tablet, TAKE 1 TABLET BY MOUTH  DAILY, Disp: 90 tablet, Rfl: 1 .  omeprazole (PRILOSEC) 20 MG capsule, Take 1 capsule (20 mg total) by mouth daily., Disp: 90 capsule, Rfl: 3 .  polyethylene glycol powder (GLYCOLAX/MIRALAX) powder, Take 17 g by mouth 3 (three) times daily as needed., Disp: 3350 g, Rfl: 1 .  hydrochlorothiazide (HYDRODIURIL) 12.5 MG tablet, Take 1 tablet (12.5 mg total) by mouth daily. (Patient not taking: Reported on 12/21/2020), Disp: 30 tablet, Rfl: 1    Assessment & Plan:  HTN : will start pt on hydralazine 25 mg q afternoon, might need to bump up to bid if conitnues to be high.  Take liisnopril 40 mg in the am.  Diet and  Medication compliance emphasised.  Avoid NSAIDS/ High salt diet.  pt advised to keep Bp logs. Pt verbalised understanding of the same. Pt to have a low salt diet . Exercise to reach a goal of at least 150 mins a week.  lifestyle modifications explained and pt understands importance of the above. ? Needs a cardiac work up.   D/w Dr. Wynetta Emery agrees with above. Pt to fu with pcp for further mx.   Orders Placed This Encounter  Procedures  . Comprehensive metabolic panel      Meds ordered this encounter  Medications  . lisinopril (ZESTRIL) 40 MG tablet    Sig: Take 1 tablet (40 mg total) by mouth daily.    Dispense:  30 tablet    Refill:  3  . hydrALAZINE (APRESOLINE) 25 MG tablet    Sig: Take 1 tablet (25 mg total) by mouth in the morning and at bedtime.    Dispense:  60 tablet    Refill:  3     Follow up plan: Return in about 1 week (around 12/28/2020).

## 2020-12-21 NOTE — Patient Instructions (Addendum)
Take liisnopril 40 mg in the am.  will start pt on hydralazine 25 mg q afternoon-  might need to bump up to twice if conitnues to be high after d/w PCP NV x 1 week  Fu with PCP x 1 week.

## 2020-12-22 LAB — COMPREHENSIVE METABOLIC PANEL
ALT: 26 IU/L (ref 0–32)
AST: 24 IU/L (ref 0–40)
Albumin/Globulin Ratio: 1.9 (ref 1.2–2.2)
Albumin: 4.4 g/dL (ref 3.8–4.8)
Alkaline Phosphatase: 98 IU/L (ref 44–121)
BUN/Creatinine Ratio: 22 (ref 12–28)
BUN: 17 mg/dL (ref 8–27)
Bilirubin Total: 0.3 mg/dL (ref 0.0–1.2)
CO2: 22 mmol/L (ref 20–29)
Calcium: 10.3 mg/dL (ref 8.7–10.3)
Chloride: 106 mmol/L (ref 96–106)
Creatinine, Ser: 0.78 mg/dL (ref 0.57–1.00)
Globulin, Total: 2.3 g/dL (ref 1.5–4.5)
Glucose: 80 mg/dL (ref 65–99)
Potassium: 4.1 mmol/L (ref 3.5–5.2)
Sodium: 144 mmol/L (ref 134–144)
Total Protein: 6.7 g/dL (ref 6.0–8.5)
eGFR: 83 mL/min/{1.73_m2} (ref >59)

## 2020-12-22 NOTE — Progress Notes (Signed)
Please let pt know this was normal.

## 2020-12-23 ENCOUNTER — Ambulatory Visit: Payer: Medicare Other | Admitting: Family Medicine

## 2020-12-24 ENCOUNTER — Ambulatory Visit: Payer: Medicare Other | Admitting: Family Medicine

## 2020-12-24 ENCOUNTER — Telehealth: Payer: Self-pay | Admitting: *Deleted

## 2020-12-24 NOTE — Telephone Encounter (Signed)
This is not a Scientist, research (physical sciences) and Wellness pt

## 2020-12-24 NOTE — Telephone Encounter (Signed)
Call to patient to notify of lab results. Patient wanted to let provider know status since medication change: 7/19- am-no medication: 131/85(had not picked up Rx)          pm: 107/67 7/20- am: 134/91          pm: 133/85 Patient is concerned - no change in swelling of feet ( using hydralazine once as directed by provider in pm- patient states it makes her sleepy) Patient has f/u next week

## 2020-12-24 NOTE — Telephone Encounter (Signed)
Apologies- forwarded to wrong office 

## 2020-12-29 ENCOUNTER — Ambulatory Visit: Payer: Medicare Other

## 2020-12-29 ENCOUNTER — Other Ambulatory Visit: Payer: Self-pay

## 2020-12-29 VITALS — BP 148/95 | HR 74 | Temp 98.2°F | Wt 154.0 lb

## 2020-12-29 DIAGNOSIS — I10 Essential (primary) hypertension: Secondary | ICD-10-CM

## 2021-01-04 ENCOUNTER — Other Ambulatory Visit: Payer: Self-pay

## 2021-01-04 ENCOUNTER — Ambulatory Visit (INDEPENDENT_AMBULATORY_CARE_PROVIDER_SITE_OTHER): Payer: Medicare Other | Admitting: Family Medicine

## 2021-01-04 ENCOUNTER — Encounter: Payer: Self-pay | Admitting: Family Medicine

## 2021-01-04 VITALS — BP 132/72 | HR 72 | Temp 98.1°F | Ht 61.4 in | Wt 153.4 lb

## 2021-01-04 DIAGNOSIS — I129 Hypertensive chronic kidney disease with stage 1 through stage 4 chronic kidney disease, or unspecified chronic kidney disease: Secondary | ICD-10-CM

## 2021-01-04 MED ORDER — HYDRALAZINE HCL 25 MG PO TABS: 25.0000 mg | ORAL_TABLET | Freq: Every day | ORAL | 1 refills | Status: DC

## 2021-01-04 MED ORDER — LISINOPRIL 40 MG PO TABS: 40.0000 mg | ORAL_TABLET | Freq: Every day | ORAL | 1 refills | Status: DC

## 2021-01-04 NOTE — Progress Notes (Signed)
BP 132/72   Pulse 72   Temp 98.1 F (36.7 C) (Oral)   Ht 5' 1.4" (1.56 m)   Wt 153 lb 6.4 oz (69.6 kg)   SpO2 94%   BMI 28.61 kg/m    Subjective:    Patient ID: Jasmine Church, female    DOB: 05-May-1951, 70 y.o.   MRN: [id number]  HPI: Jasmine Church is a 70 y.o. female  Chief Complaint  Patient presents with   Hypertension   HYPERTENSION Hypertension status: better  Satisfied with current treatment? yes Duration of hypertension: chronic BP monitoring frequency:   2x a day BP range: 130s/80s BP medication side effects:  no Medication compliance: excellent compliance Previous BP meds:hydrazine, lisinopril Aspirin: yes Recurrent headaches: no Visual changes: no Palpitations: no Dyspnea: no Chest pain: no Lower extremity edema: no Dizzy/lightheaded: no   Relevant past medical, surgical, family and social history reviewed and updated as indicated. Interim medical history since our last visit reviewed. Allergies and medications reviewed and updated.  Review of Systems  Constitutional: Negative.   Respiratory: Negative.    Cardiovascular: Negative.   Gastrointestinal: Negative.   Musculoskeletal: Negative.   Psychiatric/Behavioral: Negative.     Per HPI unless specifically indicated above     Objective:    BP 132/72   Pulse 72   Temp 98.1 F (36.7 C) (Oral)   Ht 5' 1.4" (1.56 m)   Wt 153 lb 6.4 oz (69.6 kg)   SpO2 94%   BMI 28.61 kg/m   Wt Readings from Last 3 Encounters:  01/04/21 153 lb 6.4 oz (69.6 kg)  12/29/20 154 lb (69.9 kg)  12/21/20 154 lb (69.9 kg)    Vitals with BMI 01/04/2021 12/29/2020 12/21/2020  Height 5' 1.4" - -  Weight 153 lbs 6 oz 154 lbs -  BMI 55.97 41.63 -  Systolic 845 364 680  Diastolic 72 95 321  Pulse 72 74 62    Physical Exam Vitals and nursing note reviewed.  Constitutional:      General: She is not in acute distress.    Appearance: Normal appearance. She is not ill-appearing, toxic-appearing or  diaphoretic.  HENT:     Head: Normocephalic and atraumatic.     Right Ear: External ear normal.     Left Ear: External ear normal.     Nose: Nose normal.     Mouth/Throat:     Mouth: Mucous membranes are moist.     Pharynx: Oropharynx is clear.  Eyes:     General: No scleral icterus.       Right eye: No discharge.        Left eye: No discharge.     Extraocular Movements: Extraocular movements intact.     Conjunctiva/sclera: Conjunctivae normal.     Pupils: Pupils are equal, round, and reactive to light.  Cardiovascular:     Rate and Rhythm: Normal rate and regular rhythm.     Pulses: Normal pulses.     Heart sounds: Normal heart sounds. No murmur heard.   No friction rub. No gallop.  Pulmonary:     Effort: Pulmonary effort is normal. No respiratory distress.     Breath sounds: Normal breath sounds. No stridor. No wheezing, rhonchi or rales.  Chest:     Chest wall: No tenderness.  Musculoskeletal:        General: Normal range of motion.     Cervical back: Normal range of motion and neck supple.  Skin:    General:  Skin is warm and dry.     Capillary Refill: Capillary refill takes less than 2 seconds.     Coloration: Skin is not jaundiced or pale.     Findings: No bruising, erythema, lesion or rash.  Neurological:     General: No focal deficit present.     Mental Status: She is alert and oriented to person, place, and time. Mental status is at baseline.  Psychiatric:        Mood and Affect: Mood normal.        Behavior: Behavior normal.        Thought Content: Thought content normal.        Judgment: Judgment normal.    Results for orders placed or performed in visit on 12/21/20  Comprehensive metabolic panel  Result Value Ref Range   Glucose 80 65 - 99 mg/dL   BUN 17 8 - 27 mg/dL   Creatinine, Ser 0.78 0.57 - 1.00 mg/dL   eGFR 83 >59 mL/min/1.73   BUN/Creatinine Ratio 22 12 - 28   Sodium 144 134 - 144 mmol/L   Potassium 4.1 3.5 - 5.2 mmol/L   Chloride 106 96 - 106  mmol/L   CO2 22 20 - 29 mmol/L   Calcium 10.3 8.7 - 10.3 mg/dL   Total Protein 6.7 6.0 - 8.5 g/dL   Albumin 4.4 3.8 - 4.8 g/dL   Globulin, Total 2.3 1.5 - 4.5 g/dL   Albumin/Globulin Ratio 1.9 1.2 - 2.2   Bilirubin Total 0.3 0.0 - 1.2 mg/dL   Alkaline Phosphatase 98 44 - 121 IU/L   AST 24 0 - 40 IU/L   ALT 26 0 - 32 IU/L      Assessment & Plan:   Problem List Items Addressed This Visit       Genitourinary   Benign hypertensive renal disease - Primary    Under good control on current regimen. Continue current regimen. Continue to monitor. Call with any concerns. Refills given. Labs drawn today.        Relevant Orders   Basic metabolic panel     Follow up plan: Return in about 4 weeks (around 02/01/2021).

## 2021-01-04 NOTE — Assessment & Plan Note (Signed)
Under good control on current regimen. Continue current regimen. Continue to monitor. Call with any concerns. Refills given. Labs drawn today.   

## 2021-01-04 NOTE — Patient Instructions (Signed)
Call if BP more than 160/100 or less than 100/60 more than 2x

## 2021-01-05 ENCOUNTER — Other Ambulatory Visit: Payer: Self-pay

## 2021-01-05 LAB — BASIC METABOLIC PANEL
BUN/Creatinine Ratio: 15 (ref 12–28)
BUN: 16 mg/dL (ref 8–27)
CO2: 25 mmol/L (ref 20–29)
Calcium: 9.3 mg/dL (ref 8.7–10.3)
Chloride: 103 mmol/L (ref 96–106)
Creatinine, Ser: 1.05 mg/dL — ABNORMAL HIGH (ref 0.57–1.00)
Glucose: 80 mg/dL (ref 65–99)
Potassium: 4.5 mmol/L (ref 3.5–5.2)
Sodium: 142 mmol/L (ref 134–144)
eGFR: 58 mL/min/{1.73_m2} — ABNORMAL LOW (ref >59)

## 2021-01-05 NOTE — Telephone Encounter (Signed)
Received prescription for HCTX from [facility], no longer on her active med list. Please advise?  Was this medication cancelled. Patient has seen Dr. Lenna Sciara yesterday 01/04/21

## 2021-01-11 ENCOUNTER — Other Ambulatory Visit: Payer: Self-pay | Admitting: Family Medicine

## 2021-01-11 DIAGNOSIS — Z1231 Encounter for screening mammogram for malignant neoplasm of breast: Secondary | ICD-10-CM

## 2021-02-02 ENCOUNTER — Telehealth: Payer: Self-pay | Admitting: *Deleted

## 2021-02-02 NOTE — Telephone Encounter (Signed)
Pt called to request more information regarding scheduling for next CT scan. Advised that it is recommended for her to have a follow up CT chest without contrast to follow up on a small lung nodule seen on her previous LDCT from 06/2019. Pt unable to participate in lung cancer screening at this time due to smoking quit date >15 years. Pt stated that would like to discuss with her insurance company to see how much her copay would be. PT stated will call back to schedule appts after discussing with insurance.

## 2021-02-03 ENCOUNTER — Encounter: Payer: Self-pay | Admitting: Family Medicine

## 2021-02-03 ENCOUNTER — Ambulatory Visit (INDEPENDENT_AMBULATORY_CARE_PROVIDER_SITE_OTHER): Payer: Medicare Other | Admitting: Family Medicine

## 2021-02-03 ENCOUNTER — Other Ambulatory Visit: Payer: Self-pay

## 2021-02-03 VITALS — BP 151/94 | HR 70 | Temp 98.4°F | Ht 61.42 in | Wt 156.0 lb

## 2021-02-03 DIAGNOSIS — I129 Hypertensive chronic kidney disease with stage 1 through stage 4 chronic kidney disease, or unspecified chronic kidney disease: Secondary | ICD-10-CM | POA: Diagnosis not present

## 2021-02-03 DIAGNOSIS — R5382 Chronic fatigue, unspecified: Secondary | ICD-10-CM

## 2021-02-03 LAB — BAYER DCA HB A1C WAIVED: HB A1C (BAYER DCA - WAIVED): 5.9 % (ref <7.0)

## 2021-02-03 MED ORDER — AMLODIPINE BESYLATE 5 MG PO TABS: 5.0000 mg | ORAL_TABLET | Freq: Every day | ORAL | 3 refills | Status: DC

## 2021-02-03 NOTE — Assessment & Plan Note (Signed)
Very fatigued on her hydralazine. Will stop hydralazine and start amlodipine. Continue lisinopril. Recheck about 2-4 weeks.

## 2021-02-03 NOTE — Progress Notes (Signed)
BP (!) 151/94   Pulse 70   Temp 98.4 F (36.9 C) (Oral)   Ht 5' 1.42" (1.56 m)   Wt 156 lb (70.8 kg)   SpO2 100%   BMI 29.08 kg/m    Subjective:    Patient ID: Jasmine Church, female    DOB: [birth year], 70 y.o.   MRN: [id number]  HPI: Jasmine Church is a 70 y.o. female  Chief Complaint  Patient presents with   Hypertension   HYPERTENSION Hypertension status: uncontrolled  Satisfied with current treatment? no Duration of hypertension: chronic BP monitoring frequency:   2x a day BP range: 100s-130s/60s-80s BP medication side effects:  yes- fatigue Medication compliance: excellent compliance Previous BP meds:lisinopril, hydralazine Aspirin: no Recurrent headaches: no Visual changes: no Palpitations: no Dyspnea: no Chest pain: no Lower extremity edema: no Dizzy/lightheaded: no  Relevant past medical, surgical, family and social history reviewed and updated as indicated. Interim medical history since our last visit reviewed. Allergies and medications reviewed and updated.  Review of Systems  Constitutional: Negative.   Respiratory: Negative.    Cardiovascular: Negative.   Gastrointestinal: Negative.   Musculoskeletal: Negative.   Neurological: Negative.   Psychiatric/Behavioral: Negative.     Per HPI unless specifically indicated above     Objective:    BP (!) 151/94   Pulse 70   Temp 98.4 F (36.9 C) (Oral)   Ht 5' 1.42" (1.56 m)   Wt 156 lb (70.8 kg)   SpO2 100%   BMI 29.08 kg/m   Wt Readings from Last 3 Encounters:  02/03/21 156 lb (70.8 kg)  01/04/21 153 lb 6.4 oz (69.6 kg)  12/29/20 154 lb (69.9 kg)    Physical Exam Vitals and nursing note reviewed.  Constitutional:      General: She is not in acute distress.    Appearance: Normal appearance. She is not ill-appearing, toxic-appearing or diaphoretic.  HENT:     Head: Normocephalic and atraumatic.     Right Ear: External ear normal.     Left Ear: External ear normal.     Nose:  Nose normal.     Mouth/Throat:     Mouth: Mucous membranes are moist.     Pharynx: Oropharynx is clear.  Eyes:     General: No scleral icterus.       Right eye: No discharge.        Left eye: No discharge.     Extraocular Movements: Extraocular movements intact.     Conjunctiva/sclera: Conjunctivae normal.     Pupils: Pupils are equal, round, and reactive to light.  Cardiovascular:     Rate and Rhythm: Normal rate and regular rhythm.     Pulses: Normal pulses.     Heart sounds: Normal heart sounds. No murmur heard.   No friction rub. No gallop.  Pulmonary:     Effort: Pulmonary effort is normal. No respiratory distress.     Breath sounds: Normal breath sounds. No stridor. No wheezing, rhonchi or rales.  Chest:     Chest wall: No tenderness.  Musculoskeletal:        General: Normal range of motion.     Cervical back: Normal range of motion and neck supple.  Skin:    General: Skin is warm and dry.     Capillary Refill: Capillary refill takes less than 2 seconds.     Coloration: Skin is not jaundiced or pale.     Findings: No bruising, erythema, lesion or rash.  Neurological:  General: No focal deficit present.     Mental Status: She is alert and oriented to person, place, and time. Mental status is at baseline.  Psychiatric:        Mood and Affect: Mood normal.        Behavior: Behavior normal.        Thought Content: Thought content normal.        Judgment: Judgment normal.    Results for orders placed or performed in visit on 58/85/02  Basic metabolic panel  Result Value Ref Range   Glucose 80 65 - 99 mg/dL   BUN 16 8 - 27 mg/dL   Creatinine, Ser 1.05 (H) 0.57 - 1.00 mg/dL   eGFR 58 (L) >59 mL/min/1.73   BUN/Creatinine Ratio 15 12 - 28   Sodium 142 134 - 144 mmol/L   Potassium 4.5 3.5 - 5.2 mmol/L   Chloride 103 96 - 106 mmol/L   CO2 25 20 - 29 mmol/L   Calcium 9.3 8.7 - 10.3 mg/dL      Assessment & Plan:   Problem List Items Addressed This Visit        Genitourinary   Benign hypertensive renal disease - Primary    Very fatigued on her hydralazine. Will stop hydralazine and start amlodipine. Continue lisinopril. Recheck about 2-4 weeks.       Other Visit Diagnoses     Chronic fatigue       Will check labs- likely due to low BP. Continue to monitor.    Relevant Orders   CBC with Differential/Platelet   TSH   Bayer DCA Hb A1c Waived   Comprehensive metabolic panel   Rocky mtn spotted fvr abs pnl(IgG+IgM)   Lyme Disease Serology w/Reflex   Babesia microti Antibody Panel   Ehrlichia Antibody Panel        Follow up plan: Return 2-4 weeks.

## 2021-02-04 ENCOUNTER — Telehealth: Payer: Self-pay | Admitting: *Deleted

## 2021-02-04 LAB — LYME DISEASE SEROLOGY W/REFLEX: Lyme Total Antibody EIA: NEGATIVE

## 2021-02-04 NOTE — Telephone Encounter (Signed)
Pt in agreement to pursue follow up CT chest and would like to schedule appts in the lung nodule clinic. CT chest and telephone visit scheduled and pt made aware. Instructed to call back with any questions or needs. Pt verbalized understanding.

## 2021-02-11 LAB — ROCKY MTN SPOTTED FVR ABS PNL(IGG+IGM)
RMSF IgG: NEGATIVE
RMSF IgM: 0.79 index (ref 0.00–0.89)

## 2021-02-11 LAB — CBC WITH DIFFERENTIAL/PLATELET
Basophils Absolute: 0.1 10*3/uL (ref 0.0–0.2)
Basos: 1 %
EOS (ABSOLUTE): 0.4 10*3/uL (ref 0.0–0.4)
Eos: 7 %
Hematocrit: 44.7 % (ref 34.0–46.6)
Hemoglobin: 14.4 g/dL (ref 11.1–15.9)
Immature Grans (Abs): 0 10*3/uL (ref 0.0–0.1)
Immature Granulocytes: 0 %
Lymphocytes Absolute: 1.6 10*3/uL (ref 0.7–3.1)
Lymphs: 30 %
MCH: 27.2 pg (ref 26.6–33.0)
MCHC: 32.2 g/dL (ref 31.5–35.7)
MCV: 84 fL (ref 79–97)
Monocytes Absolute: 0.5 10*3/uL (ref 0.1–0.9)
Monocytes: 9 %
Neutrophils Absolute: 2.9 10*3/uL (ref 1.4–7.0)
Neutrophils: 53 %
Platelets: 265 10*3/uL (ref 150–450)
RBC: 5.3 x10E6/uL — ABNORMAL HIGH (ref 3.77–5.28)
RDW: 14.3 % (ref 11.7–15.4)
WBC: 5.5 10*3/uL (ref 3.4–10.8)

## 2021-02-11 LAB — EHRLICHIA ANTIBODY PANEL
E. Chaffeensis (HME) IgM Titer: NEGATIVE
E.Chaffeensis (HME) IgG: NEGATIVE
HGE IgG Titer: NEGATIVE
HGE IgM Titer: NEGATIVE

## 2021-02-11 LAB — COMPREHENSIVE METABOLIC PANEL
ALT: 24 IU/L (ref 0–32)
AST: 22 IU/L (ref 0–40)
Albumin/Globulin Ratio: 2.1 (ref 1.2–2.2)
Albumin: 4.6 g/dL (ref 3.8–4.8)
Alkaline Phosphatase: 111 IU/L (ref 44–121)
BUN/Creatinine Ratio: 13 (ref 12–28)
BUN: 15 mg/dL (ref 8–27)
Bilirubin Total: 0.4 mg/dL (ref 0.0–1.2)
CO2: 24 mmol/L (ref 20–29)
Calcium: 9.8 mg/dL (ref 8.7–10.3)
Chloride: 100 mmol/L (ref 96–106)
Creatinine, Ser: 1.18 mg/dL — ABNORMAL HIGH (ref 0.57–1.00)
Globulin, Total: 2.2 g/dL (ref 1.5–4.5)
Glucose: 93 mg/dL (ref 65–99)
Potassium: 4.7 mmol/L (ref 3.5–5.2)
Sodium: 142 mmol/L (ref 134–144)
Total Protein: 6.8 g/dL (ref 6.0–8.5)
eGFR: 50 mL/min/{1.73_m2} — ABNORMAL LOW (ref >59)

## 2021-02-11 LAB — BABESIA MICROTI ANTIBODY PANEL
Babesia microti IgG: <1:10 {titer}
Babesia microti IgM: <1:10 {titer}

## 2021-02-11 LAB — TSH: TSH: 3.24 u[IU]/mL (ref 0.450–4.500)

## 2021-02-12 ENCOUNTER — Encounter: Payer: Self-pay | Admitting: Family Medicine

## 2021-02-15 ENCOUNTER — Telehealth: Payer: Self-pay | Admitting: Family Medicine

## 2021-02-15 ENCOUNTER — Other Ambulatory Visit: Payer: Self-pay | Admitting: Internal Medicine

## 2021-02-15 ENCOUNTER — Ambulatory Visit: Payer: Self-pay

## 2021-02-15 NOTE — Telephone Encounter (Signed)
dc'd 12/21/20 Charlynne Cousins MD ineffective

## 2021-02-15 NOTE — Telephone Encounter (Signed)
Routing to provider to advise on patient's BP.

## 2021-02-15 NOTE — Telephone Encounter (Signed)
Message from Luciana Axe sent at 02/15/2021 10:35 AM EDT  Pt is calling because she was placed on amLODipine (NORVASC) 5 MG tablet [id number] on 02/03/21. Pt does not feel that the medication is working  Pt took her BP today Before taking the medication her BP was 139/87- An hour later after taking the medication it dropped to 128/84 no sx only fatigue.   Pt also reports BP readings on 02/06/21 106/67, 02/07/21 134/86. 02/08/21 111/69, 02/09/21 117/74, 25-Feb-2021 131/95 (her bird died this day), February 26, 2021 120/83 before the pill, did not take BP afterward, 02/12/21 108/78.   Pt reports that Dr. Neomia Dear wanted her BP to be in the range of 140-190. Dr. Wynetta Emery is the Dr. Parks Ranger placed her on the medicine.   Pt also reports that she has not heard anything on her lab results.   Please advise     Call History   Type Contact Phone/Fax User  02/15/2021 10:28 AM EDT Phone (Incoming) Jasmine Purpura "Susie" (Self) [phone] Lemmie Evens) Blase Mess A  Pt. Reports she started Amlodipine 02/03/21 and BP has been lower, but she feels "bad and fatigued. My pressure is too low." Please advise pt.   Answer Assessment - Initial Assessment Questions 1. BLOOD PRESSURE: "What is the blood pressure?" "Did you take at least two measurements 5 minutes apart?"     139/87  before med    128/84 2. ONSET: "When did you take your blood pressure?"     02/03/21 3. HOW: "How did you obtain the blood pressure?" (e.g., visiting nurse, automatic home BP monitor)     Home cuff 4. HISTORY: "Do you have a history of low blood pressure?" "What is your blood pressure normally?"     No 5. MEDICATIONS: "Are you taking any medications for blood pressure?" If Yes, ask: "Have they been changed recently?"     Amlodipine 6. PULSE RATE: "Do you know what your pulse rate is?"       7. OTHER SYMPTOMS: "Have you been sick recently?" "Have you had a recent injury?"     No 8. PREGNANCY: "Is there any chance you are pregnant?" "When was your last  menstrual period?"     No  Protocols used: Blood Pressure - Low-A-AH

## 2021-02-15 NOTE — Telephone Encounter (Signed)
Patient should make an appointment with Dr. Neomia Dear to discuss blood pressure and medications.   It looks like a letter was mailed to her regarding her lab results.

## 2021-02-16 ENCOUNTER — Ambulatory Visit
Admission: RE | Admit: 2021-02-16 | Discharge: 2021-02-16 | Disposition: A | Discharge status: Home / Self Care | Payer: Medicare Other | Source: Ambulatory Visit | Attending: Oncology | Admitting: Oncology

## 2021-02-16 ENCOUNTER — Ambulatory Visit: Payer: Medicare Other

## 2021-02-16 ENCOUNTER — Other Ambulatory Visit: Payer: Self-pay

## 2021-02-16 DIAGNOSIS — R911 Solitary pulmonary nodule: Secondary | ICD-10-CM | POA: Insufficient documentation

## 2021-02-16 DIAGNOSIS — I700 Atherosclerosis of aorta: Secondary | ICD-10-CM | POA: Diagnosis not present

## 2021-02-16 DIAGNOSIS — J439 Emphysema, unspecified: Secondary | ICD-10-CM | POA: Diagnosis not present

## 2021-02-16 DIAGNOSIS — R918 Other nonspecific abnormal finding of lung field: Secondary | ICD-10-CM | POA: Diagnosis not present

## 2021-02-18 ENCOUNTER — Encounter: Payer: Self-pay | Admitting: Internal Medicine

## 2021-02-18 ENCOUNTER — Ambulatory Visit (INDEPENDENT_AMBULATORY_CARE_PROVIDER_SITE_OTHER): Payer: Medicare Other | Admitting: Internal Medicine

## 2021-02-18 ENCOUNTER — Inpatient Hospital Stay: Payer: Medicare Other | Attending: Oncology | Admitting: Oncology

## 2021-02-18 ENCOUNTER — Telehealth: Payer: Self-pay | Admitting: Family Medicine

## 2021-02-18 ENCOUNTER — Other Ambulatory Visit: Payer: Self-pay

## 2021-02-18 VITALS — BP 137/85 | HR 83 | Temp 98.1°F | Ht 61.5 in | Wt 156.2 lb

## 2021-02-18 DIAGNOSIS — I10 Essential (primary) hypertension: Secondary | ICD-10-CM

## 2021-02-18 DIAGNOSIS — R5383 Other fatigue: Secondary | ICD-10-CM | POA: Insufficient documentation

## 2021-02-18 DIAGNOSIS — R911 Solitary pulmonary nodule: Secondary | ICD-10-CM | POA: Diagnosis not present

## 2021-02-18 NOTE — Progress Notes (Signed)
There were no vitals taken for this visit.   Subjective:    Patient ID: Jasmine Church, female    DOB: 05-16-1951, 70 y.o.   MRN: [id number]  No chief complaint on file.   HPI: Jasmine Church is a 70 y.o. female  Pt is here for a [place] visit, feels she felt very fatigued and SOB , trembling per pt. Morning when she wakes up her bp is fine and then drops. Denies fever/ chills/ coughing nothing up. No N. Diarrhea since 9/1 has had to use miralax in the past  Dropped to 95/69 mm hg 6: 30 pm. Feels tired and no energy. 160 / 80 mm hg this morning when the EMS checked her. Per pt her bp drops lower in the mid afternoons/ early eveneings   Hypertension This is a chronic problem. The problem is uncontrolled. Associated symptoms include malaise/fatigue and sweats. Pertinent negatives include no anxiety, blurred vision, chest pain, headaches, neck pain, orthopnea, palpitations, peripheral edema, PND or shortness of breath.   No chief complaint on file.   Relevant past medical, surgical, family and social history reviewed and updated as indicated. Interim medical history since our last visit reviewed. Allergies and medications reviewed and updated.  Review of Systems  Constitutional:  Positive for malaise/fatigue.  Eyes:  Negative for blurred vision.  Respiratory:  Negative for shortness of breath.   Cardiovascular:  Negative for chest pain, palpitations, orthopnea and PND.  Musculoskeletal:  Negative for neck pain.  Neurological:  Negative for headaches.   Per HPI unless specifically indicated above     Objective:    There were no vitals taken for this visit.  Wt Readings from Last 3 Encounters:  02/03/21 156 lb (70.8 kg)  01/04/21 153 lb 6.4 oz (69.6 kg)  12/29/20 154 lb (69.9 kg)    Physical Exam Vitals and nursing note reviewed.  Constitutional:      General: She is not in acute distress.    Appearance: Normal appearance. She is not ill-appearing or diaphoretic.   Eyes:     Conjunctiva/sclera: Conjunctivae normal.  Pulmonary:     Breath sounds: No rhonchi.  Abdominal:     General: Abdomen is flat. Bowel sounds are normal. There is no distension.     Palpations: Abdomen is soft. There is no mass.     Tenderness: There is no abdominal tenderness. There is no guarding.  Skin:    General: Skin is warm and dry.     Coloration: Skin is not jaundiced.     Findings: No erythema.  Neurological:     Mental Status: She is alert.    Results for orders placed or performed in visit on 02/03/21  CBC with Differential/Platelet  Result Value Ref Range   WBC 5.5 3.4 - 10.8 x10E3/uL   RBC 5.30 (H) 3.77 - 5.28 x10E6/uL   Hemoglobin 14.4 11.1 - 15.9 g/dL   Hematocrit 60.6 77.0 - 46.6 %   MCV 84 79 - 97 fL   MCH 27.2 26.6 - 33.0 pg   MCHC 32.2 31.5 - 35.7 g/dL   RDW 34.0 35.2 - 48.1 %   Platelets 265 150 - 450 x10E3/uL   Neutrophils 53 Not Estab. %   Lymphs 30 Not Estab. %   Monocytes 9 Not Estab. %   Eos 7 Not Estab. %   Basos 1 Not Estab. %   Neutrophils Absolute 2.9 1.4 - 7.0 x10E3/uL   Lymphocytes Absolute 1.6 0.7 - 3.1 x10E3/uL  Monocytes Absolute 0.5 0.1 - 0.9 x10E3/uL   EOS (ABSOLUTE) 0.4 0.0 - 0.4 x10E3/uL   Basophils Absolute 0.1 0.0 - 0.2 x10E3/uL   Immature Granulocytes 0 Not Estab. %   Immature Grans (Abs) 0.0 0.0 - 0.1 x10E3/uL  TSH  Result Value Ref Range   TSH 3.240 0.450 - 4.500 uIU/mL  Bayer DCA Hb A1c Waived  Result Value Ref Range   HB A1C (BAYER DCA - WAIVED) 5.9 <7.0 %  Comprehensive metabolic panel  Result Value Ref Range   Glucose 93 65 - 99 mg/dL   BUN 15 8 - 27 mg/dL   Creatinine, Ser 1.18 (H) 0.57 - 1.00 mg/dL   eGFR 50 (L) >59 mL/min/1.73   BUN/Creatinine Ratio 13 12 - 28   Sodium 142 134 - 144 mmol/L   Potassium 4.7 3.5 - 5.2 mmol/L   Chloride 100 96 - 106 mmol/L   CO2 24 20 - 29 mmol/L   Calcium 9.8 8.7 - 10.3 mg/dL   Total Protein 6.8 6.0 - 8.5 g/dL   Albumin 4.6 3.8 - 4.8 g/dL   Globulin, Total 2.2 1.5 -  4.5 g/dL   Albumin/Globulin Ratio 2.1 1.2 - 2.2   Bilirubin Total 0.4 0.0 - 1.2 mg/dL   Alkaline Phosphatase 111 44 - 121 IU/L   AST 22 0 - 40 IU/L   ALT 24 0 - 32 IU/L  Rocky mtn spotted fvr abs pnl(IgG+IgM)  Result Value Ref Range   RMSF IgG Negative Negative   RMSF IgM 0.79 0.00 - 0.89 index  Lyme Disease Serology w/Reflex  Result Value Ref Range   Lyme Total Antibody EIA Negative Negative  Babesia microti Antibody Panel  Result Value Ref Range   Babesia microti IgM <1:10 Neg:<1:10   Babesia microti IgG <2:48 GNO:<0:37  Ehrlichia Antibody Panel  Result Value Ref Range   E.Chaffeensis (HME) IgG Negative Neg:<1:64   E. Chaffeensis (HME) IgM Titer Negative Neg:<1:20   HGE IgG Titer Negative Neg:<1:64   HGE IgM Titer Negative Neg:<1:20        Current Outpatient Medications:    amLODipine (NORVASC) 5 MG tablet, Take 1 tablet (5 mg total) by mouth daily., Disp: 30 tablet, Rfl: 3   aspirin EC 81 MG tablet, Take 81 mg by mouth daily., Disp: , Rfl:    atorvastatin (LIPITOR) 20 MG tablet, Take 1 tablet (20 mg total) by mouth at bedtime., Disp: 90 tablet, Rfl: 1   Calcium Carbonate-Vitamin D 500-125 MG-UNIT TABS, Take by mouth daily. Patient takes it only 3 days a week., Disp: , Rfl:    clobetasol cream (TEMOVATE) 0.48 %, Apply 1 application topically 2 (two) times a week., Disp: 60 g, Rfl: 1   lisinopril (ZESTRIL) 40 MG tablet, Take 1 tablet (40 mg total) by mouth daily., Disp: 90 tablet, Rfl: 1   omeprazole (PRILOSEC) 20 MG capsule, Take 1 capsule (20 mg total) by mouth daily., Disp: 90 capsule, Rfl: 3   polyethylene glycol powder (GLYCOLAX/MIRALAX) powder, Take 17 g by mouth 3 (three) times daily as needed., Disp: 3350 g, Rfl: 1    Assessment & Plan:  HTN / hypotension "  Cut back on lisinopril to 30 mg. Continue norvasc 5 mg.  Wants a referral to cards.  Will refer to dr. Rockey Situ per pts request.  Pt and husband worried about ? Heart issues  Might benefit from a stress test/  Holter monitor.  Increase water intake of note Cr mildly up. Need to monitor/ check next visit  is in 2 weeks.   Ref. Range 01/04/2021 09:24 02/03/2021 10:11 02/03/2021 10:16  Creatinine Latest Ref Range: 0.57 - 1.00 mg/dL 1.05 (H)  1.18 (H)   Problem List Items Addressed This Visit   None    No orders of the defined types were placed in this encounter.    No orders of the defined types were placed in this encounter.    Follow up plan: No follow-ups on file.

## 2021-02-18 NOTE — Progress Notes (Signed)
Pulmonary Nodule Clinic Consult note [facility]  Telephone:([phone] [phone]  Patient Care Team: Valerie Roys, DO as PCP - General (Family Medicine)   Name of the patient: Jasmine Church  [id number]  Oct 02, 1950   Date of visit: 02/18/2021   Diagnosis- Lung Nodule  Virtual Visit via Telephone Note   I connected with Mrs. Pattan on 02/18/21 at 9:30 am by telephone visit and verified that I am speaking with the correct person using two identifiers.   I discussed the limitations, risks, security and privacy concerns of performing an evaluation and management service by telemedicine and the availability of in-person appointments. I also discussed with the patient that there may be a patient responsible charge related to this service. The patient expressed understanding and agreed to proceed.   Other persons participating in the visit and their role in the encounter:   Patient's location: Home  Provider's location: Clinic   Chief complaint/ Reason for visit- Pulmonary Nodule Clinic Initial Visit  Past Medical History:  Ms. Delille is a 70 year old female with past medical history significant for CAD, COPD, osteoporosis, hypertension, CKD, hyperlipidemia who was previously managed by our low-dose CT screening program who no longer meets criteria given she quit smoking greater than 15 years ago.   Her last low-dose CT scan was on 07/05/2019 which showed several small pulmonary nodules measuring as much as 4.3 mm.  There was also incidental finding of a 1.1 cm lesion of the liver.   Interval history- She returns today to discuss recent Ct scan.  Mrs. Baldassarre is a former smoker and quit approximately 16 years ago (2006).  She smoked 3 packs of cigarettes per day for about 32 years.  She denies any occupational exposures known to cause cancer.  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  She has 1 child who is now grown.  She is married and lives at  home with her husband.  Reports her family is fairly healthy.  Her sister did have a cancerous tumor in her vagina.  She is still living.  Otherwise no other family history of cancer.  Typically Ms. Lacivita feels pretty good.  Unfortunately, today has had episodes of an elevated blood pressure.  She had to call EMS due to feeling like she "might pass out".  Blood pressure was 160/80.  She has since made an appointment with her PCP whom she sees at 1130 today.  She states her blood pressure medicine has been manipulated recently and feels more adjustments need to be made.  ECOG FS:0 - Asymptomatic  Review of systems- Review of Systems  Constitutional:  Positive for malaise/fatigue. Negative for chills, fever and weight loss.  HENT:  Negative for congestion, ear pain and tinnitus.   Eyes: Negative.  Negative for blurred vision and double vision.  Respiratory: Negative.  Negative for cough, sputum production and shortness of breath.   Cardiovascular: Negative.  Negative for chest pain, palpitations and leg swelling.  Gastrointestinal: Negative.  Negative for abdominal pain, constipation, diarrhea, nausea and vomiting.  Genitourinary:  Negative for dysuria, frequency and urgency.  Musculoskeletal:  Negative for back pain and falls.  Skin: Negative.  Negative for rash.  Neurological:  Positive for dizziness. Negative for weakness and headaches.  Endo/Heme/Allergies: Negative.  Does not bruise/bleed easily.  Psychiatric/Behavioral: Negative.  Negative for depression. The patient is not nervous/anxious and does not have insomnia.     No Known Allergies   Past Medical History:  Diagnosis  Date   Arthritis    CKD (chronic kidney disease) stage 3, GFR 30-59 ml/min (HCC)    Hyperlipidemia    Hypertension    Vaginal atrophy      Past Surgical History:  Procedure Laterality Date   HEMORROIDECTOMY     HERNIA REPAIR      Social History   Socioeconomic History   Marital status: Married     Spouse name: Not on file   Number of children: Not on file   Years of education: Not on file   Highest education level: Not on file  Occupational History   Not on file  Tobacco Use   Smoking status: Former    Packs/day: 1.00    Years: 33.00    Pack years: 33.00    Types: Cigarettes    Quit date: 06/06/2004    Years since quitting: 16.7   Smokeless tobacco: Never  Vaping Use   Vaping Use: Never used  Substance and Sexual Activity   Alcohol use: No   Drug use: No   Sexual activity: Never  Other Topics Concern   Not on file  Social History Narrative   Not on file   Social Determinants of Health   Financial Resource Strain: Low Risk    Difficulty of Paying Living Expenses: Not hard at all  Food Insecurity: No Food Insecurity   Worried About Charity fundraiser in the Last Year: Never true   Ran Out of Food in the Last Year: Never true  Transportation Needs: No Transportation Needs   Lack of Transportation (Medical): No   Lack of Transportation (Non-Medical): No  Physical Activity: Inactive   Days of Exercise per Week: 0 days   Minutes of Exercise per Session: 0 min  Stress: No Stress Concern Present   Feeling of Stress : Not at all  Social Connections: Not on file  Intimate Partner Violence: Not on file    Family History  Problem Relation Age of Onset   Arthritis Mother    Hyperlipidemia Mother    Hypertension Mother    Heart disease Father    Hypertension Father    Heart attack Father    Cancer Sister        uterus   Hypertension Sister    Heart disease Brother    Heart attack Brother    Hypertension Brother    Stroke Maternal Grandmother    Heart attack Brother    Hypertension Brother    Diabetes Neg Hx    Breast cancer Neg Hx      Current Outpatient Medications:    amLODipine (NORVASC) 5 MG tablet, Take 1 tablet (5 mg total) by mouth daily., Disp: 30 tablet, Rfl: 3   aspirin EC 81 MG tablet, Take 81 mg by mouth daily., Disp: , Rfl:    atorvastatin  (LIPITOR) 20 MG tablet, Take 1 tablet (20 mg total) by mouth at bedtime., Disp: 90 tablet, Rfl: 1   Calcium Carbonate-Vitamin D 500-125 MG-UNIT TABS, Take by mouth daily. Patient takes it only 3 days a week., Disp: , Rfl:    clobetasol cream (TEMOVATE) [id number] %, Apply 1 application topically 2 (two) times a week., Disp: 60 g, Rfl: 1   lisinopril (ZESTRIL) 40 MG tablet, Take 1 tablet (40 mg total) by mouth daily., Disp: 90 tablet, Rfl: 1   omeprazole (PRILOSEC) 20 MG capsule, Take 1 capsule (20 mg total) by mouth daily., Disp: 90 capsule, Rfl: 3   polyethylene glycol powder (GLYCOLAX/MIRALAX) powder, Take  17 g by mouth 3 (three) times daily as needed., Disp: 3350 g, Rfl: 1  Physical exam: There were no vitals filed for this visit. Physical Exam Neurological:     Mental Status: She is alert and oriented to person, place, and time.     CMP Latest Ref Rng & Units 02/03/2021  Glucose 65 - 99 mg/dL 93  BUN 8 - 27 mg/dL 15  Creatinine 0.57 - 1.00 mg/dL 1.18(H)  Sodium 134 - 144 mmol/L 142  Potassium 3.5 - 5.2 mmol/L 4.7  Chloride 96 - 106 mmol/L 100  CO2 20 - 29 mmol/L 24  Calcium 8.7 - 10.3 mg/dL 9.8  Total Protein 6.0 - 8.5 g/dL 6.8  Total Bilirubin 0.0 - 1.2 mg/dL 0.4  Alkaline Phos 44 - 121 IU/L 111  AST 0 - 40 IU/L 22  ALT 0 - 32 IU/L 24   CBC Latest Ref Rng & Units 02/03/2021  WBC 3.4 - 10.8 x10E3/uL 5.5  Hemoglobin 11.1 - 15.9 g/dL 14.4  Hematocrit 34.0 - 46.6 % 44.7  Platelets 150 - 450 x10E3/uL 265    No images are attached to the encounter.  CT Chest Wo Contrast  Result Date: 02/16/2021 CLINICAL DATA:  Lung nodule EXAM: CT CHEST WITHOUT CONTRAST TECHNIQUE: Multidetector CT imaging of the chest was performed following the standard protocol without IV contrast. COMPARISON:  07/05/2019 FINDINGS: Cardiovascular: Aortic atherosclerosis. Normal heart size. Three-vessel coronary artery calcifications. No pericardial effusion. Mediastinum/Nodes: No enlarged mediastinal, hilar, or  axillary lymph nodes. Moderate hiatal hernia with intrathoracic position of the gastric fundus thyroid gland, trachea, and esophagus demonstrate no significant findings. Lungs/Pleura: Mild centrilobular emphysema. Occasional small pulmonary nodules are stable and benign, largest a subpleural nodule measuring 0.4 cm in the right lower lobe (series 4, image 96). No pleural effusion or pneumothorax. Upper Abdomen: No acute abnormality. Musculoskeletal: No chest wall mass or suspicious bone lesions identified. IMPRESSION: 1. Occasional small pulmonary nodules are stable and benign, largest a subpleural nodule measuring 0.4 cm in the right lower lobe. No further follow-up or characterization is required. 2. Mild emphysema. 3. Moderate hiatal hernia with intrathoracic position of the gastric fundus. 4. Coronary artery disease. Aortic Atherosclerosis (ICD10-I70.0) and Emphysema (ICD10-J43.9). Electronically Signed   By: Eddie Candle M.D.   On: 02/16/2021 14:49     Assessment and plan- Patient is a 70 y.o. female who presents to pulmonary nodule clinic for follow-up of incidental lung nodules.  A telephone visit was conducted to review most recent CT scan results.   CT scan from 02/16/2021 showed occasional small pulmonary nodules that are stable and benign largest a subpleural nodule measuring 0.4 cm in the right lower lobe.  No further follow-up or characterization is required.  Mild emphysema.    Calculating malignancy probability of a pulmonary nodule: Risk factors include: 1.  Age. 2.  Cancer history. 3.  Diameter of pulmonary nodule and mm 4.  Location 5.  Smoking history 6.  Spiculation present   Based on risk factors, this patient is HIGH risk for the development of lung cancer given her smoking history.  We have now maintained several years of stability with these pulmonary nodules.  No additional follow-up is needed at this time.  Plan/Assessment-Mrs. Haering is a 70 year old female who presents  for follow-up for incidental lung nodules discovered during low-dose CT screening.  No additional follow-up needed in the pulmonary nodule clinic.  Patient quit smoking greater than 15 years ago.  She feels well.  She no longer  meets criteria for the low-dose CT screening program.  She may be released from the lung nodule clinic at this time.  Should she need [place] in the future she is welcome to reach back out.   Visit Diagnosis 1. Incidental lung nodule, > 2m and < 834m    Patient expressed understanding and was in agreement with this plan. She also understands that She can call clinic at any time with any questions, concerns, or complaints.   I provided 20 minutes of non face-to-face telephone visit time during this encounter, and > 50% was spent counseling as documented under my assessment & plan.   Thank you for allowing me to participate in the care of this very pleasant patient.    JeJacquelin HawkingNP CHCanutet AlAdvanced [facility]ell - [id number]- [id number]/15/2022 11:34 AM

## 2021-02-18 NOTE — Telephone Encounter (Signed)
Copied from [place] [id number]. Topic: Referral - Request for Referral >> Feb 18, 2021  9:12 AM Leward Quan A wrote: Has patient seen PCP for this complaint? Yes.   *If NO, is insurance requiring patient see PCP for this issue before PCP can refer them? Referral for which specialty: Cardiologist  Preferred provider/office: Fletcher Anon MD, Minna Merritts or Dr Pincus Badder  Reason for referral: Elevated BP

## 2021-02-18 NOTE — Telephone Encounter (Signed)
Done today at office visit

## 2021-02-19 ENCOUNTER — Ambulatory Visit: Payer: Medicare Other | Admitting: Internal Medicine

## 2021-02-26 ENCOUNTER — Telehealth: Payer: Self-pay | Admitting: Cardiology

## 2021-02-26 ENCOUNTER — Ambulatory Visit: Payer: Medicare Other | Admitting: Cardiology

## 2021-02-26 ENCOUNTER — Other Ambulatory Visit: Payer: Self-pay

## 2021-02-26 ENCOUNTER — Encounter: Payer: Self-pay | Admitting: Cardiology

## 2021-02-26 VITALS — BP 150/90 | HR 88 | Ht 61.5 in | Wt 158.0 lb

## 2021-02-26 DIAGNOSIS — I10 Essential (primary) hypertension: Secondary | ICD-10-CM

## 2021-02-26 DIAGNOSIS — E7800 Pure hypercholesterolemia, unspecified: Secondary | ICD-10-CM

## 2021-02-26 DIAGNOSIS — R011 Cardiac murmur, unspecified: Secondary | ICD-10-CM | POA: Diagnosis not present

## 2021-02-26 MED ORDER — LISINOPRIL 10 MG PO TABS: 10.0000 mg | ORAL_TABLET | Freq: Every day | ORAL | 3 refills | Status: DC

## 2021-02-26 MED ORDER — HYDROCHLOROTHIAZIDE 12.5 MG PO CAPS: 12.5000 mg | ORAL_CAPSULE | Freq: Every day | ORAL | 0 refills | Status: DC

## 2021-02-26 MED ORDER — HYDROCHLOROTHIAZIDE 12.5 MG PO CAPS: 12.5000 mg | ORAL_CAPSULE | Freq: Every day | ORAL | Status: DC

## 2021-02-26 NOTE — Patient Instructions (Signed)
Medication Instructions:   Your physician has recommended you make the following change in your medication:    STOP taking your Amlodipine (Norvasc)  2.    DECREASE your Lisinopril to 10 MG once a day.  3.    START taking HCTZ 12.5 MG once a day.   *If you need a refill on your cardiac medications before your next appointment, please call your pharmacy*   Lab Work: None ordered If you have labs (blood work) drawn today and your tests are completely normal, you will receive your results only by: [place] (if you have MyChart) OR A paper copy in the mail If you have any lab test that is abnormal or we need to change your treatment, we will call you to review the results.   Testing/Procedures:  Your physician has requested that you have an echocardiogram. Echocardiography is a painless test that uses sound waves to create images of your heart. It provides your doctor with information about the size and shape of your heart and how well your heart's chambers and valves are working. This procedure takes approximately one hour. There are no restrictions for this procedure.    Follow-Up: At [facility], you and your health needs are our priority.  As part of our continuing mission to provide you with exceptional heart care, we have created designated Provider Care Teams.  These Care Teams include your primary Cardiologist (physician) and Advanced Practice Providers (APPs -  Physician Assistants and Nurse Practitioners) who all work together to provide you with the care you need, when you need it.  We recommend signing up for the patient portal called "MyChart".  Sign up information is provided on this After Visit Summary.  MyChart is used to connect with patients for Virtual Visits (Telemedicine).  Patients are able to view lab/test results, encounter notes, upcoming appointments, etc.  Non-urgent messages can be sent to your provider as well.   To learn more about what you can do  with MyChart, go to [email or web address].    Your next appointment:   6 week(s)  The format for your next appointment:   In Person  Provider:   Kate Sable, MD   Other Instructions  Call us with your Blood Pressure readings in 2 weeks

## 2021-02-26 NOTE — Progress Notes (Signed)
Cardiology Office Note:    Date:  02/26/2021   ID:  Jasmine Church, DOB [birth year], MRN [id number]  PCP:  Jasmine Roys, DO   [facility] Providers Cardiologist:  None     Referring MD: Charlynne Cousins, MD   Chief Complaint  Patient presents with   New Patient (Initial Visit)    Referred by PCP for HTN. Meds reviewed verbally with patient.     History of Present Illness:    Jasmine Church is a 70 y.o. female with a hx of hypertension, hyperlipidemia, CKD who presents due to hypertension.  States having problems with BP over the past year.  Had a dental extraction 4 months ago, given some medications with systolics up to 962E.  Since then her blood pressures have been waxing and waning.  Was placed on HCTZ, lisinopril 30 mg daily, [place].  HCTZ help with her chronic edema, but this was stopped due to low blood pressures.  Amlodipine was started.  Blood pressures at home usually in the 100s to 140s.  Occasional 36O systolic were noted.  She denies chest pain, shortness of breath.  Has edema, sometimes present when she wakes up.  Past Medical History:  Diagnosis Date   Arthritis    CKD (chronic kidney disease) stage 3, GFR 30-59 ml/min (HCC)    Hyperlipidemia    Hypertension    Vaginal atrophy     Past Surgical History:  Procedure Laterality Date   HEMORROIDECTOMY     HERNIA REPAIR      Current Medications: Current Meds  Medication Sig   aspirin EC 81 MG tablet Take 81 mg by mouth daily.   atorvastatin (LIPITOR) 20 MG tablet Take 1 tablet (20 mg total) by mouth at bedtime.   Calcium Carbonate-Vitamin D 500-125 MG-UNIT TABS Take by mouth daily. Patient takes it only 3 days a week.   clobetasol cream (TEMOVATE) 2.94 % Apply 1 application topically 2 (two) times a week.   hydrochlorothiazide (MICROZIDE) 12.5 MG capsule Take 1 capsule (12.5 mg total) by mouth daily.   omeprazole (PRILOSEC) 20 MG capsule Take 1 capsule (20 mg total) by  mouth daily.   polyethylene glycol powder (GLYCOLAX/MIRALAX) powder Take 17 g by mouth 3 (three) times daily as needed.   [DISCONTINUED] amLODipine (NORVASC) 5 MG tablet Take 1 tablet (5 mg total) by mouth daily.   [DISCONTINUED] lisinopril (ZESTRIL) 20 MG tablet Take 20 mg by mouth daily.     Allergies:   Patient has no known allergies.   Social History   Socioeconomic History   Marital status: Married    Spouse name: Not on file   Number of children: Not on file   Years of education: Not on file   Highest education level: Not on file  Occupational History   Not on file  Tobacco Use   Smoking status: Former    Packs/day: 1.00    Years: 33.00    Pack years: 33.00    Types: Cigarettes    Quit date: 06/06/2004    Years since quitting: 16.7   Smokeless tobacco: Never  Vaping Use   Vaping Use: Never used  Substance and Sexual Activity   Alcohol use: No   Drug use: No   Sexual activity: Never  Other Topics Concern   Not on file  Social History Narrative   Not on file   Social Determinants of Health   Financial Resource Strain: Low Risk    Difficulty of  Paying Living Expenses: Not hard at all  Food Insecurity: No Food Insecurity   Worried About [place] in the Last Year: Never true   Ran Out of Food in the Last Year: Never true  Transportation Needs: No Transportation Needs   Lack of Transportation (Medical): No   Lack of Transportation (Non-Medical): No  Physical Activity: Inactive   Days of Exercise per Week: 0 days   Minutes of Exercise per Session: 0 min  Stress: No Stress Concern Present   Feeling of Stress : Not at all  Social Connections: Not on file     Family History: The patient's family history includes Arthritis in her mother; Cancer in her sister; Heart attack in her brother, brother, and father; Heart disease in her brother and father; Hyperlipidemia in her mother; Hypertension in her brother, brother, father, mother, and sister; Stroke in her  maternal grandmother. There is no history of Diabetes or Breast cancer.  ROS:   Please see the history of present illness.     All other systems reviewed and are negative.  EKGs/Labs/Other Studies Reviewed:    The following studies were reviewed today:   EKG:  EKG is  ordered today.  The ekg ordered today demonstrates normal sinus rhythm  Recent Labs: 02/03/2021: ALT 24; BUN 15; Creatinine, Ser 1.18; Hemoglobin 14.4; Platelets 265; Potassium 4.7; Sodium 142; TSH 3.240  Recent Lipid Panel    Component Value Date/Time   CHOL 121 07/06/2020 0853   TRIG 69 07/06/2020 0853   HDL 39 (L) 07/06/2020 0853   LDLCALC 68 07/06/2020 0853     Risk Assessment/Calculations:          Physical Exam:    VS:  BP (!) 150/90 (BP Location: Left Arm, Patient Position: Sitting, Cuff Size: Normal)   Pulse 88   Ht 5' 1.5" (1.562 m)   Wt 158 lb (71.7 kg)   SpO2 96%   BMI 29.37 kg/m     Wt Readings from Last 3 Encounters:  02/26/21 158 lb (71.7 kg)  02/18/21 156 lb 3.2 oz (70.9 kg)  02/03/21 156 lb (70.8 kg)     GEN:  Well nourished, well developed in no acute distress HEENT: Normal NECK: No JVD; No carotid bruits LYMPHATICS: No lymphadenopathy CARDIAC: RRR, 2/6 systolic murmur RESPIRATORY:  Clear to auscultation without rales, wheezing or rhonchi  ABDOMEN: Soft, non-tender, non-distended MUSCULOSKELETAL:  No edema; No deformity  SKIN: Warm and dry NEUROLOGIC:  Alert and oriented x 3 PSYCHIATRIC:  Normal affect   ASSESSMENT:    1. Primary hypertension   2. Systolic murmur   3. Pure hypercholesterolemia    PLAN:    In order of problems listed above:  Hypertension, BP not controlled.  Start HCTZ 12.5 mg daily, reduce lisinopril to 10 mg daily.  Plan to titrate lisinopril for adequate BP control. Systolic murmur on exam, get echo to evaluate cardiac function. Hyperlipidemia, cholesterol controlled on statin.  Continue Lipitor 20 mg daily.   Follow-up after echocardiogram.      Medication Adjustments/Labs and Tests Ordered: Current medicines are reviewed at length with the patient today.  Concerns regarding medicines are outlined above.  Orders Placed This Encounter  Procedures   EKG 12-Lead   ECHOCARDIOGRAM COMPLETE    Meds ordered this encounter  Medications   lisinopril (ZESTRIL) 10 MG tablet    Sig: Take 1 tablet (10 mg total) by mouth daily.    Dispense:  90 tablet    Refill:  3  hydrochlorothiazide (MICROZIDE) 12.5 MG capsule    Sig: Take 1 capsule (12.5 mg total) by mouth daily.     Patient Instructions  Medication Instructions:   Your physician has recommended you make the following change in your medication:    STOP taking your Amlodipine (Norvasc)  2.    DECREASE your Lisinopril to 10 MG once a day.  3.    START taking HCTZ 12.5 MG once a day.   *If you need a refill on your cardiac medications before your next appointment, please call your pharmacy*   Lab Work: None ordered If you have labs (blood work) drawn today and your tests are completely normal, you will receive your results only by: [place] (if you have MyChart) OR A paper copy in the mail If you have any lab test that is abnormal or we need to change your treatment, we will call you to review the results.   Testing/Procedures:  Your physician has requested that you have an echocardiogram. Echocardiography is a painless test that uses sound waves to create images of your heart. It provides your doctor with information about the size and shape of your heart and how well your heart's chambers and valves are working. This procedure takes approximately one hour. There are no restrictions for this procedure.    Follow-Up: At [facility], you and your health needs are our priority.  As part of our continuing mission to provide you with exceptional heart care, we have created designated Provider Care Teams.  These Care Teams include your primary Cardiologist  (physician) and Advanced Practice Providers (APPs -  Physician Assistants and Nurse Practitioners) who all work together to provide you with the care you need, when you need it.  We recommend signing up for the patient portal called "MyChart".  Sign up information is provided on this After Visit Summary.  MyChart is used to connect with patients for Virtual Visits (Telemedicine).  Patients are able to view lab/test results, encounter notes, upcoming appointments, etc.  Non-urgent messages can be sent to your provider as well.   To learn more about what you can do with MyChart, go to [email or web address].    Your next appointment:   6 week(s)  The format for your next appointment:   In Person  Provider:   Kate Sable, MD   Other Instructions  Call us with your Blood Pressure readings in 2 weeks   Signed, Kate Sable, MD  02/26/2021 12:39 PM    [place]

## 2021-02-26 NOTE — Telephone Encounter (Signed)
*  STAT* If patient is at the pharmacy, call can be transferred to refill team.   1. Which medications need to be refilled? (please list name of each medication and dose if known)  HCTZ 12.5 mg po q d   2. Which pharmacy/location (including street and city if local pharmacy) is medication to be sent to? [facility]   3. Do they need a 30 day or 90 day supply? [place]

## 2021-03-01 ENCOUNTER — Encounter: Payer: Self-pay | Admitting: Family Medicine

## 2021-03-01 ENCOUNTER — Other Ambulatory Visit: Payer: Self-pay

## 2021-03-01 ENCOUNTER — Ambulatory Visit (INDEPENDENT_AMBULATORY_CARE_PROVIDER_SITE_OTHER): Payer: Medicare Other | Admitting: Family Medicine

## 2021-03-01 VITALS — BP 125/85 | HR 67 | Ht 61.5 in | Wt 157.0 lb

## 2021-03-01 DIAGNOSIS — I10 Essential (primary) hypertension: Secondary | ICD-10-CM

## 2021-03-01 DIAGNOSIS — Z23 Encounter for immunization: Secondary | ICD-10-CM | POA: Diagnosis not present

## 2021-03-01 NOTE — Assessment & Plan Note (Signed)
Doing much better. Continue current regimen. Just changed 3 days ago- will hold on labs now and recheck 1 month. Call with any concerns.

## 2021-03-01 NOTE — Progress Notes (Signed)
BP 125/85   Pulse 67   Ht 5' 1.5" (1.562 m)   Wt 157 lb (71.2 kg)   BMI 29.18 kg/m    Subjective:    Patient ID: Jasmine Church, female    DOB: 02/15/1951, 70 y.o.   MRN: [id number]  HPI: Jasmine Church is a 70 y.o. female  Chief Complaint  Patient presents with   Hypertension   HYPERTENSION Hypertension status: better  Satisfied with current treatment? no Duration of hypertension: chronic BP monitoring frequency:  a few times a week BP medication side effects:  no Medication compliance: excellent compliance Previous BP meds:lisinopril, HCTZ, amlodipine, hydralazine Aspirin: yes Recurrent headaches: no Visual changes: no Palpitations: no Dyspnea: no Chest pain: no Lower extremity edema: no Dizzy/lightheaded: no  Relevant past medical, surgical, family and social history reviewed and updated as indicated. Interim medical history since our last visit reviewed. Allergies and medications reviewed and updated.  Review of Systems  Constitutional: Negative.   Respiratory: Negative.    Cardiovascular: Negative.   Gastrointestinal: Negative.   Musculoskeletal: Negative.   Psychiatric/Behavioral: Negative.     Per HPI unless specifically indicated above     Objective:    BP 125/85   Pulse 67   Ht 5' 1.5" (1.562 m)   Wt 157 lb (71.2 kg)   BMI 29.18 kg/m   Wt Readings from Last 3 Encounters:  03/01/21 157 lb (71.2 kg)  02/26/21 158 lb (71.7 kg)  02/18/21 156 lb 3.2 oz (70.9 kg)    Physical Exam Vitals and nursing note reviewed.  Constitutional:      General: She is not in acute distress.    Appearance: Normal appearance. She is not ill-appearing, toxic-appearing or diaphoretic.  HENT:     Head: Normocephalic and atraumatic.     Right Ear: External ear normal.     Left Ear: External ear normal.     Nose: Nose normal.     Mouth/Throat:     Mouth: Mucous membranes are moist.     Pharynx: Oropharynx is clear.  Eyes:     General: No scleral  icterus.       Right eye: No discharge.        Left eye: No discharge.     Extraocular Movements: Extraocular movements intact.     Conjunctiva/sclera: Conjunctivae normal.     Pupils: Pupils are equal, round, and reactive to light.  Cardiovascular:     Rate and Rhythm: Normal rate and regular rhythm.     Pulses: Normal pulses.     Heart sounds: Normal heart sounds. No murmur heard.   No friction rub. No gallop.  Pulmonary:     Effort: Pulmonary effort is normal. No respiratory distress.     Breath sounds: Normal breath sounds. No stridor. No wheezing, rhonchi or rales.  Chest:     Chest wall: No tenderness.  Musculoskeletal:        General: Normal range of motion.     Cervical back: Normal range of motion and neck supple.  Skin:    General: Skin is warm and dry.     Capillary Refill: Capillary refill takes less than 2 seconds.     Coloration: Skin is not jaundiced or pale.     Findings: No bruising, erythema, lesion or rash.  Neurological:     General: No focal deficit present.     Mental Status: She is alert and oriented to person, place, and time. Mental status is at baseline.  Psychiatric:        Mood and Affect: Mood normal.        Behavior: Behavior normal.        Thought Content: Thought content normal.        Judgment: Judgment normal.    Results for orders placed or performed in visit on 02/03/21  CBC with Differential/Platelet  Result Value Ref Range   WBC 5.5 3.4 - 10.8 x10E3/uL   RBC 5.30 (H) 3.77 - 5.28 x10E6/uL   Hemoglobin 14.4 11.1 - 15.9 g/dL   Hematocrit 44.7 34.0 - 46.6 %   MCV 84 79 - 97 fL   MCH 27.2 26.6 - 33.0 pg   MCHC 32.2 31.5 - 35.7 g/dL   RDW 14.3 11.7 - 15.4 %   Platelets 265 150 - 450 x10E3/uL   Neutrophils 53 Not Estab. %   Lymphs 30 Not Estab. %   Monocytes 9 Not Estab. %   Eos 7 Not Estab. %   Basos 1 Not Estab. %   Neutrophils Absolute 2.9 1.4 - 7.0 x10E3/uL   Lymphocytes Absolute 1.6 0.7 - 3.1 x10E3/uL   Monocytes Absolute 0.5  0.1 - 0.9 x10E3/uL   EOS (ABSOLUTE) 0.4 0.0 - 0.4 x10E3/uL   Basophils Absolute 0.1 0.0 - 0.2 x10E3/uL   Immature Granulocytes 0 Not Estab. %   Immature Grans (Abs) 0.0 0.0 - 0.1 x10E3/uL  TSH  Result Value Ref Range   TSH 3.240 0.450 - 4.500 uIU/mL  Bayer DCA Hb A1c Waived  Result Value Ref Range   HB A1C (BAYER DCA - WAIVED) 5.9 <7.0 %  Comprehensive metabolic panel  Result Value Ref Range   Glucose 93 65 - 99 mg/dL   BUN 15 8 - 27 mg/dL   Creatinine, Ser 1.18 (H) 0.57 - 1.00 mg/dL   eGFR 50 (L) >59 mL/min/1.73   BUN/Creatinine Ratio 13 12 - 28   Sodium 142 134 - 144 mmol/L   Potassium 4.7 3.5 - 5.2 mmol/L   Chloride 100 96 - 106 mmol/L   CO2 24 20 - 29 mmol/L   Calcium 9.8 8.7 - 10.3 mg/dL   Total Protein 6.8 6.0 - 8.5 g/dL   Albumin 4.6 3.8 - 4.8 g/dL   Globulin, Total 2.2 1.5 - 4.5 g/dL   Albumin/Globulin Ratio 2.1 1.2 - 2.2   Bilirubin Total 0.4 0.0 - 1.2 mg/dL   Alkaline Phosphatase 111 44 - 121 IU/L   AST 22 0 - 40 IU/L   ALT 24 0 - 32 IU/L  Rocky mtn spotted fvr abs pnl(IgG+IgM)  Result Value Ref Range   RMSF IgG Negative Negative   RMSF IgM 0.79 0.00 - 0.89 index  Lyme Disease Serology w/Reflex  Result Value Ref Range   Lyme Total Antibody EIA Negative Negative  Babesia microti Antibody Panel  Result Value Ref Range   Babesia microti IgM <1:10 Neg:<1:10   Babesia microti IgG <3:54 SFK:<8:12  Ehrlichia Antibody Panel  Result Value Ref Range   E.Chaffeensis (HME) IgG Negative Neg:<1:64   E. Chaffeensis (HME) IgM Titer Negative Neg:<1:20   HGE IgG Titer Negative Neg:<1:64   HGE IgM Titer Negative Neg:<1:20      Assessment & Plan:   Problem List Items Addressed This Visit       Cardiovascular and Mediastinum   Hypertension - Primary    Doing much better. Continue current regimen. Just changed 3 days ago- will hold on labs now and recheck 1 month. Call with any concerns.  Other Visit Diagnoses     Needs flu shot       Relevant Orders   Flu  Vaccine QUAD High Dose(Fluad)        Follow up plan: Return in about 4 weeks (around 03/29/2021).

## 2021-03-03 ENCOUNTER — Ambulatory Visit
Admission: RE | Admit: 2021-03-03 | Discharge: 2021-03-03 | Disposition: A | Discharge status: Home / Self Care | Payer: Medicare Other | Source: Ambulatory Visit | Attending: Family Medicine | Admitting: Family Medicine

## 2021-03-03 ENCOUNTER — Other Ambulatory Visit: Payer: Self-pay

## 2021-03-03 DIAGNOSIS — Z1231 Encounter for screening mammogram for malignant neoplasm of breast: Secondary | ICD-10-CM

## 2021-03-05 ENCOUNTER — Encounter: Payer: Self-pay | Admitting: Family Medicine

## 2021-03-08 ENCOUNTER — Other Ambulatory Visit: Payer: Self-pay | Admitting: Internal Medicine

## 2021-03-09 ENCOUNTER — Telehealth: Payer: Self-pay | Admitting: Cardiology

## 2021-03-09 MED ORDER — HYDROCHLOROTHIAZIDE 12.5 MG PO TABS: 12.5000 mg | ORAL_TABLET | Freq: Every day | ORAL | 3 refills | Status: DC

## 2021-03-09 NOTE — Telephone Encounter (Signed)
Patient calling to update with BP readings before she goes on vacation   10/04 - 102/74 10/03 - 132/84  10/02 - 125/97 10/01 - 115/83

## 2021-03-09 NOTE — Telephone Encounter (Signed)
Requested medication (s) are due for refill today: No  Requested medication (s) are on the active medication list: Yes  Last refill:  02/26/21 by Dr. Garen Lah  Future visit scheduled: Yes  Notes to clinic:  Protocol indicates the medication was d/c 12/21/20 by Dr. Neomia Dear.    Requested Prescriptions  Pending Prescriptions Disp Refills   hydrochlorothiazide (HYDRODIURIL) 12.5 MG tablet [Pharmacy Med Name: HYDROCHLOROTHIAZIDE 12.5MG  TABLETS] 90 tablet     Sig: TAKE 1 TABLET(12.5 MG) BY MOUTH DAILY     Cardiovascular: Diuretics - Thiazide Failed - 03/08/2021  2:46 PM      Failed - Cr in normal range and within 360 days    Creatinine, Ser  Date Value Ref Range Status  02/03/2021 1.18 (H) 0.57 - 1.00 mg/dL Final          Passed - Ca in normal range and within 360 days    Calcium  Date Value Ref Range Status  02/03/2021 9.8 8.7 - 10.3 mg/dL Final          Passed - K in normal range and within 360 days    Potassium  Date Value Ref Range Status  02/03/2021 4.7 3.5 - 5.2 mmol/L Final          Passed - Na in normal range and within 360 days    Sodium  Date Value Ref Range Status  02/03/2021 142 134 - 144 mmol/L Final          Passed - Last BP in normal range    BP Readings from Last 1 Encounters:  03/01/21 125/85          Passed - Valid encounter within last 6 months    Recent Outpatient Visits           1 week ago Hypertension, unspecified type   [facility] [place], Megan P, DO   2 weeks ago Hypertension, unspecified type   [facility] Vigg, Avanti, MD   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

## 2021-03-09 NOTE — Telephone Encounter (Signed)
Called patient back and informed her that her BP numbers looked great. She also stated that the hydrochlorothiazide (Microzide) 12.5 MG capsules she received from [facility] were larger than her previous hydrochlorothiazide tablets which were Hydrodiuril. [facility] informed her that they would send her a return label to sent those back and that we could order the other brand. I went ahead and sent in a new order and cancelled the previous one accordingly.  Patient was very grateful for the call back.  Will route to Dr. Launa Grill as he requested the call back with the BP readings 2 weeks post OV.

## 2021-03-26 ENCOUNTER — Other Ambulatory Visit: Payer: Self-pay | Admitting: Family Medicine

## 2021-03-26 NOTE — Telephone Encounter (Signed)
Requested Prescriptions  Pending Prescriptions Disp Refills  . atorvastatin (LIPITOR) 20 MG tablet [Pharmacy Med Name: Atorvastatin Calcium 20 MG Oral Tablet] 90 tablet 0    Sig: TAKE 1 TABLET BY MOUTH AT  BEDTIME     Cardiovascular:  Antilipid - Statins Failed - 03/26/2021  8:33 AM      Failed - HDL in normal range and within 360 days    HDL  Date Value Ref Range Status  07/06/2020 39 (L) >39 mg/dL Final         Passed - Total Cholesterol in normal range and within 360 days    Cholesterol, Total  Date Value Ref Range Status  07/06/2020 121 100 - 199 mg/dL Final         Passed - LDL in normal range and within 360 days    LDL Chol Calc (NIH)  Date Value Ref Range Status  07/06/2020 68 0 - 99 mg/dL Final         Passed - Triglycerides in normal range and within 360 days    Triglycerides  Date Value Ref Range Status  07/06/2020 69 0 - 149 mg/dL Final         Passed - Patient is not pregnant      Passed - Valid encounter within last 12 months    Recent Outpatient Visits          3 weeks ago Hypertension, unspecified type   [place], Megan P, DO   1 month ago Hypertension, unspecified type   [facility] Vigg, Avanti, MD   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

## 2021-03-30 ENCOUNTER — Encounter: Payer: Self-pay | Admitting: Family Medicine

## 2021-03-30 ENCOUNTER — Ambulatory Visit (INDEPENDENT_AMBULATORY_CARE_PROVIDER_SITE_OTHER): Payer: Medicare Other | Admitting: Family Medicine

## 2021-03-30 ENCOUNTER — Other Ambulatory Visit: Payer: Self-pay

## 2021-03-30 VITALS — BP 120/82 | HR 73 | Ht 61.5 in | Wt 157.0 lb

## 2021-03-30 DIAGNOSIS — I10 Essential (primary) hypertension: Secondary | ICD-10-CM

## 2021-03-30 NOTE — Assessment & Plan Note (Signed)
Still running low at home. Will stop HCTZ and see how she is doing. Recheck by phone in about 10 days. If ankles start swelling, will restart and cut her lisinopril to 5mg . Continue to monitor.

## 2021-03-30 NOTE — Patient Instructions (Signed)
Stop the HCTZ for about a week to 10 days. I'll call you in 10 days to see if your ankles are swelling and how your diarrhea is

## 2021-03-30 NOTE — Progress Notes (Signed)
BP 120/82   Pulse 73   Ht 5' 1.5" (1.562 m)   Wt 157 lb (71.2 kg)   BMI 29.18 kg/m    Subjective:    Patient ID: Jasmine Church, female    DOB: 09-07-1950, 70 y.o.   MRN: [id number]  HPI: Jasmine Church is a 70 y.o. female  Chief Complaint  Patient presents with   Hypertension   HYPERTENSION Hypertension status: better  Satisfied with current treatment? no Duration of hypertension: chronic BP monitoring frequency:  a few times a week BP range: 90s/60s-110s/60s BP medication side effects:  yes- diarrhea Medication compliance: excellent compliance Previous BP meds: Aspirin: yes Recurrent headaches: no Visual changes: no Palpitations: no Dyspnea: no Chest pain: no Lower extremity edema: no Dizzy/lightheaded: yes   Relevant past medical, surgical, family and social history reviewed and updated as indicated. Interim medical history since our last visit reviewed. Allergies and medications reviewed and updated.  Review of Systems  Constitutional:  Positive for fatigue. Negative for activity change, appetite change, chills, diaphoresis, fever and unexpected weight change.  Respiratory: Negative.    Cardiovascular: Negative.   Gastrointestinal: Negative.   Musculoskeletal: Negative.   Psychiatric/Behavioral: Negative.     Per HPI unless specifically indicated above     Objective:    BP 120/82   Pulse 73   Ht 5' 1.5" (1.562 m)   Wt 157 lb (71.2 kg)   BMI 29.18 kg/m   Wt Readings from Last 3 Encounters:  03/30/21 157 lb (71.2 kg)  03/01/21 157 lb (71.2 kg)  02/26/21 158 lb (71.7 kg)    Physical Exam Vitals and nursing note reviewed.  Constitutional:      General: She is not in acute distress.    Appearance: Normal appearance. She is not ill-appearing, toxic-appearing or diaphoretic.  HENT:     Head: Normocephalic and atraumatic.     Right Ear: External ear normal.     Left Ear: External ear normal.     Nose: Nose normal.     Mouth/Throat:      Mouth: Mucous membranes are moist.     Pharynx: Oropharynx is clear.  Eyes:     General: No scleral icterus.       Right eye: No discharge.        Left eye: No discharge.     Extraocular Movements: Extraocular movements intact.     Conjunctiva/sclera: Conjunctivae normal.     Pupils: Pupils are equal, round, and reactive to light.  Cardiovascular:     Rate and Rhythm: Normal rate and regular rhythm.     Pulses: Normal pulses.     Heart sounds: Normal heart sounds. No murmur heard.   No friction rub. No gallop.  Pulmonary:     Effort: Pulmonary effort is normal. No respiratory distress.     Breath sounds: Normal breath sounds. No stridor. No wheezing, rhonchi or rales.  Chest:     Chest wall: No tenderness.  Musculoskeletal:        General: Normal range of motion.     Cervical back: Normal range of motion and neck supple.  Skin:    General: Skin is warm and dry.     Capillary Refill: Capillary refill takes less than 2 seconds.     Coloration: Skin is not jaundiced or pale.     Findings: No bruising, erythema, lesion or rash.  Neurological:     General: No focal deficit present.     Mental Status: She  is alert and oriented to person, place, and time. Mental status is at baseline.  Psychiatric:        Mood and Affect: Mood normal.        Behavior: Behavior normal.        Thought Content: Thought content normal.        Judgment: Judgment normal.    Results for orders placed or performed in visit on 02/03/21  CBC with Differential/Platelet  Result Value Ref Range   WBC 5.5 3.4 - 10.8 x10E3/uL   RBC 5.30 (H) 3.77 - 5.28 x10E6/uL   Hemoglobin 14.4 11.1 - 15.9 g/dL   Hematocrit 44.7 34.0 - 46.6 %   MCV 84 79 - 97 fL   MCH 27.2 26.6 - 33.0 pg   MCHC 32.2 31.5 - 35.7 g/dL   RDW 14.3 11.7 - 15.4 %   Platelets 265 150 - 450 x10E3/uL   Neutrophils 53 Not Estab. %   Lymphs 30 Not Estab. %   Monocytes 9 Not Estab. %   Eos 7 Not Estab. %   Basos 1 Not Estab. %   Neutrophils  Absolute 2.9 1.4 - 7.0 x10E3/uL   Lymphocytes Absolute 1.6 0.7 - 3.1 x10E3/uL   Monocytes Absolute 0.5 0.1 - 0.9 x10E3/uL   EOS (ABSOLUTE) 0.4 0.0 - 0.4 x10E3/uL   Basophils Absolute 0.1 0.0 - 0.2 x10E3/uL   Immature Granulocytes 0 Not Estab. %   Immature Grans (Abs) 0.0 0.0 - 0.1 x10E3/uL  TSH  Result Value Ref Range   TSH 3.240 0.450 - 4.500 uIU/mL  Bayer DCA Hb A1c Waived  Result Value Ref Range   HB A1C (BAYER DCA - WAIVED) 5.9 <7.0 %  Comprehensive metabolic panel  Result Value Ref Range   Glucose 93 65 - 99 mg/dL   BUN 15 8 - 27 mg/dL   Creatinine, Ser 1.18 (H) 0.57 - 1.00 mg/dL   eGFR 50 (L) >59 mL/min/1.73   BUN/Creatinine Ratio 13 12 - 28   Sodium 142 134 - 144 mmol/L   Potassium 4.7 3.5 - 5.2 mmol/L   Chloride 100 96 - 106 mmol/L   CO2 24 20 - 29 mmol/L   Calcium 9.8 8.7 - 10.3 mg/dL   Total Protein 6.8 6.0 - 8.5 g/dL   Albumin 4.6 3.8 - 4.8 g/dL   Globulin, Total 2.2 1.5 - 4.5 g/dL   Albumin/Globulin Ratio 2.1 1.2 - 2.2   Bilirubin Total 0.4 0.0 - 1.2 mg/dL   Alkaline Phosphatase 111 44 - 121 IU/L   AST 22 0 - 40 IU/L   ALT 24 0 - 32 IU/L  Rocky mtn spotted fvr abs pnl(IgG+IgM)  Result Value Ref Range   RMSF IgG Negative Negative   RMSF IgM 0.79 0.00 - 0.89 index  Lyme Disease Serology w/Reflex  Result Value Ref Range   Lyme Total Antibody EIA Negative Negative  Babesia microti Antibody Panel  Result Value Ref Range   Babesia microti IgM <1:10 Neg:<1:10   Babesia microti IgG <1:69 CVE:<9:38  Ehrlichia Antibody Panel  Result Value Ref Range   E.Chaffeensis (HME) IgG Negative Neg:<1:64   E. Chaffeensis (HME) IgM Titer Negative Neg:<1:20   HGE IgG Titer Negative Neg:<1:64   HGE IgM Titer Negative Neg:<1:20      Assessment & Plan:   Problem List Items Addressed This Visit       Cardiovascular and Mediastinum   Hypertension - Primary    Still running low at home. Will stop HCTZ and see  how she is doing. Recheck by phone in about 10 days. If ankles  start swelling, will restart and cut her lisinopril to $RemoveBefor'5mg'YwfLNetGAJvv$ . Continue to monitor.         Follow up plan: Return in about 3 months (around 06/30/2021), or physical (after 07/06/21).

## 2021-04-01 ENCOUNTER — Ambulatory Visit (INDEPENDENT_AMBULATORY_CARE_PROVIDER_SITE_OTHER): Payer: Medicare Other

## 2021-04-01 ENCOUNTER — Other Ambulatory Visit: Payer: Self-pay

## 2021-04-01 DIAGNOSIS — R011 Cardiac murmur, unspecified: Secondary | ICD-10-CM | POA: Diagnosis not present

## 2021-04-01 LAB — ECHOCARDIOGRAM COMPLETE
AR max vel: 2.37 cm2
AV Area VTI: 2.35 cm2
AV Area mean vel: 2.19 cm2
AV Mean grad: 5 mmHg
AV Peak grad: 9.5 mmHg
Ao pk vel: 1.54 m/s
Area-P 1/2: 2.66 cm2
Calc EF: 61.3 %
S' Lateral: 2.7 cm
Single Plane A2C EF: 57 %
Single Plane A4C EF: 64.6 %

## 2021-04-09 ENCOUNTER — Encounter: Payer: Self-pay | Admitting: Cardiology

## 2021-04-09 ENCOUNTER — Other Ambulatory Visit: Payer: Self-pay

## 2021-04-09 ENCOUNTER — Ambulatory Visit: Payer: Medicare Other | Admitting: Cardiology

## 2021-04-09 ENCOUNTER — Telehealth: Payer: Self-pay | Admitting: Family Medicine

## 2021-04-09 VITALS — BP 122/80 | HR 72 | Ht 63.0 in | Wt 160.0 lb

## 2021-04-09 DIAGNOSIS — R600 Localized edema: Secondary | ICD-10-CM | POA: Diagnosis not present

## 2021-04-09 DIAGNOSIS — I10 Essential (primary) hypertension: Secondary | ICD-10-CM | POA: Diagnosis not present

## 2021-04-09 DIAGNOSIS — R011 Cardiac murmur, unspecified: Secondary | ICD-10-CM

## 2021-04-09 DIAGNOSIS — E7800 Pure hypercholesterolemia, unspecified: Secondary | ICD-10-CM | POA: Diagnosis not present

## 2021-04-09 MED ORDER — FUROSEMIDE 20 MG PO TABS: 20.0000 mg | ORAL_TABLET | Freq: Every day | ORAL | 1 refills | Status: DC

## 2021-04-09 MED ORDER — LISINOPRIL 10 MG PO TABS: 10.0000 mg | ORAL_TABLET | Freq: Every day | ORAL | 1 refills | Status: DC

## 2021-04-09 NOTE — Telephone Encounter (Signed)
Can we call to see how her BP has been running?

## 2021-04-09 NOTE — Progress Notes (Signed)
Cardiology Office Note:    Date:  04/09/2021   ID:  Jasmine Church, DOB 1950/11/11, MRN [id number]  PCP:  Valerie Roys, DO   [facility] Providers Cardiologist:  None     Referring MD: Valerie Roys, DO   Chief Complaint  Patient presents with   Other    Follow up post ECHO. Meds reviewed verbally with patient.     History of Present Illness:    Jasmine Church is a 70 y.o. female with a hx of hypertension, hyperlipidemia, CKD who presents for follow-up.  Previously seen due to hypertension.  Patient also with edema and occasional low blood pressures.  HCTZ was decreased to 12.5 mg daily, lisinopril was decreased to 10 mg daily.  She developed diarrhea after 1 month of taking HCTZ.  Stop for 10 days with resolution of diarrhea.  Also was drinking a peach drink around the same.,  Unsure if this contributed.  Blood pressures at home have been well controlled with just lisinopril 10 mg daily.  She states her leg swelling seems to be worsening with stopping HCTZ.  Past Medical History:  Diagnosis Date   Arthritis    CKD (chronic kidney disease) stage 3, GFR 30-59 ml/min (HCC)    Hyperlipidemia    Hypertension    Vaginal atrophy     Past Surgical History:  Procedure Laterality Date   HEMORROIDECTOMY     HERNIA REPAIR      Current Medications: Current Meds  Medication Sig   aspirin EC 81 MG tablet Take 81 mg by mouth daily.   atorvastatin (LIPITOR) 20 MG tablet TAKE 1 TABLET BY MOUTH AT  BEDTIME   Calcium Carbonate-Vitamin D 500-125 MG-UNIT TABS Take by mouth daily. Patient takes it only 3 days a week.   clobetasol cream (TEMOVATE) 7.82 % Apply 1 application topically 2 (two) times a week.   furosemide (LASIX) 20 MG tablet Take 1 tablet (20 mg total) by mouth daily.   omeprazole (PRILOSEC) 20 MG capsule Take 1 capsule (20 mg total) by mouth daily.   polyethylene glycol powder (GLYCOLAX/MIRALAX) powder Take 17 g by mouth 3 (three) times daily as needed.    [DISCONTINUED] lisinopril (ZESTRIL) 10 MG tablet Take 1 tablet (10 mg total) by mouth daily.     Allergies:   Patient has no known allergies.   Social History   Socioeconomic History   Marital status: Married    Spouse name: Not on file   Number of children: Not on file   Years of education: Not on file   Highest education level: Not on file  Occupational History   Not on file  Tobacco Use   Smoking status: Former    Packs/day: 1.00    Years: 33.00    Pack years: 33.00    Types: Cigarettes    Quit date: 06/06/2004    Years since quitting: 16.8   Smokeless tobacco: Never  Vaping Use   Vaping Use: Never used  Substance and Sexual Activity   Alcohol use: No   Drug use: No   Sexual activity: Never  Other Topics Concern   Not on file  Social History Narrative   Not on file   Social Determinants of Health   Financial Resource Strain: Low Risk    Difficulty of Paying Living Expenses: Not hard at all  Food Insecurity: No Food Insecurity   Worried About [place] in the Last Year: Never true   Ran Out of  Food in the Last Year: Never true  Transportation Needs: No Transportation Needs   Lack of Transportation (Medical): No   Lack of Transportation (Non-Medical): No  Physical Activity: Inactive   Days of Exercise per Week: 0 days   Minutes of Exercise per Session: 0 min  Stress: No Stress Concern Present   Feeling of Stress : Not at all  Social Connections: Not on file     Family History: The patient's family history includes Arthritis in her mother; Cancer in her sister; Heart attack in her brother, brother, and father; Heart disease in her brother and father; Hyperlipidemia in her mother; Hypertension in her brother, brother, father, mother, and sister; Stroke in her maternal grandmother. There is no history of Diabetes or Breast cancer.  ROS:   Please see the history of present illness.     All other systems reviewed and are negative.  EKGs/Labs/Other  Studies Reviewed:    The following studies were reviewed today:   EKG:  EKG not ordered today.   Recent Labs: 02/03/2021: ALT 24; BUN 15; Creatinine, Ser 1.18; Hemoglobin 14.4; Platelets 265; Potassium 4.7; Sodium 142; TSH 3.240  Recent Lipid Panel    Component Value Date/Time   CHOL 121 07/06/2020 0853   TRIG 69 07/06/2020 0853   HDL 39 (L) 07/06/2020 0853   LDLCALC 68 07/06/2020 0853     Risk Assessment/Calculations:          Physical Exam:    VS:  BP 122/80 (BP Location: Left Arm, Patient Position: Sitting, Cuff Size: Normal)   Pulse 72   Ht 5\' 3"  (1.6 m)   Wt 160 lb (72.6 kg)   SpO2 98%   BMI 28.34 kg/m     Wt Readings from Last 3 Encounters:  04/09/21 160 lb (72.6 kg)  03/30/21 157 lb (71.2 kg)  03/01/21 157 lb (71.2 kg)     GEN:  Well nourished, well developed in no acute distress HEENT: Normal NECK: No JVD; No carotid bruits LYMPHATICS: No lymphadenopathy CARDIAC: RRR, 2/6 systolic murmur RESPIRATORY:  Clear to auscultation without rales, wheezing or rhonchi  ABDOMEN: Soft, non-tender, non-distended MUSCULOSKELETAL:  No edema; No deformity  SKIN: Warm and dry NEUROLOGIC:  Alert and oriented x 3 PSYCHIATRIC:  Normal affect   ASSESSMENT:    1. Primary hypertension   2. Leg edema   3. Systolic murmur   4. Pure hypercholesterolemia     PLAN:    In order of problems listed above:  Hypertension, BP now controlled. lisinopril to 10 mg daily.   Leg edema, stop HCTZ, due to symptoms of diarrhea.  Take Lasix as needed for leg edema. Systolic murmur on exam, echo showed mild aortic valve sclerosis with no significant obstruction.  Mild aortic valve sclerosis/calcification and likely cause for murmur.  EF 60 to 65%. Hyperlipidemia, cholesterol controlled on statin.  Continue Lipitor 20 mg daily.  Follow-up 6 months.   Medication Adjustments/Labs and Tests Ordered: Current medicines are reviewed at length with the patient today.  Concerns regarding  medicines are outlined above.  No orders of the defined types were placed in this encounter.   Meds ordered this encounter  Medications   furosemide (LASIX) 20 MG tablet    Sig: Take 1 tablet (20 mg total) by mouth daily.    Dispense:  90 tablet    Refill:  1   lisinopril (ZESTRIL) 10 MG tablet    Sig: Take 1 tablet (10 mg total) by mouth daily.  Dispense:  90 tablet    Refill:  1      Patient Instructions  Medication Instructions:   Your physician has recommended you make the following change in your medication:    STOP taking your Hydrochlorothiazide.  2.    START taking Lasix 20 MG only as needed.  *If you need a refill on your cardiac medications before your next appointment, please call your pharmacy*   Lab Work:  None Ordered  If you have labs (blood work) drawn today and your tests are completely normal, you will receive your results only by: [place] (if you have MyChart) OR A paper copy in the mail If you have any lab test that is abnormal or we need to change your treatment, we will call you to review the results.   Testing/Procedures:  None Ordered   Follow-Up: At [facility], you and your health needs are our priority.  As part of our continuing mission to provide you with exceptional heart care, we have created designated Provider Care Teams.  These Care Teams include your primary Cardiologist (physician) and Advanced Practice Providers (APPs -  Physician Assistants and Nurse Practitioners) who all work together to provide you with the care you need, when you need it.  We recommend signing up for the patient portal called "MyChart".  Sign up information is provided on this After Visit Summary.  MyChart is used to connect with patients for Virtual Visits (Telemedicine).  Patients are able to view lab/test results, encounter notes, upcoming appointments, etc.  Non-urgent messages can be sent to your provider as well.   To learn more about what  you can do with MyChart, go to [email or web address].    Your next appointment:   6 month(s)  The format for your next appointment:   In Person  Provider:   You may see Dr. Garen Lah or one of the following Advanced Practice Providers on your designated Care Team:   Murray Hodgkins, NP Christell Faith, PA-C Cadence Kathlen Mody, [place]    Other Instructions    Signed, Kate Sable, MD  04/09/2021 12:18 PM    [place]

## 2021-04-09 NOTE — Telephone Encounter (Signed)
Patient states her BP has been good, seen at cardiology today and BP was 122/80.

## 2021-04-09 NOTE — Patient Instructions (Signed)
Medication Instructions:   Your physician has recommended you make the following change in your medication:    STOP taking your Hydrochlorothiazide.  2.    START taking Lasix 20 MG only as needed.  *If you need a refill on your cardiac medications before your next appointment, please call your pharmacy*   Lab Work:  None Ordered  If you have labs (blood work) drawn today and your tests are completely normal, you will receive your results only by: [place] (if you have MyChart) OR A paper copy in the mail If you have any lab test that is abnormal or we need to change your treatment, we will call you to review the results.   Testing/Procedures:  None Ordered   Follow-Up: At [facility], you and your health needs are our priority.  As part of our continuing mission to provide you with exceptional heart care, we have created designated Provider Care Teams.  These Care Teams include your primary Cardiologist (physician) and Advanced Practice Providers (APPs -  Physician Assistants and Nurse Practitioners) who all work together to provide you with the care you need, when you need it.  We recommend signing up for the patient portal called "MyChart".  Sign up information is provided on this After Visit Summary.  MyChart is used to connect with patients for Virtual Visits (Telemedicine).  Patients are able to view lab/test results, encounter notes, upcoming appointments, etc.  Non-urgent messages can be sent to your provider as well.   To learn more about what you can do with MyChart, go to [email or web address].    Your next appointment:   6 month(s)  The format for your next appointment:   In Person  Provider:   You may see Dr. Garen Lah or one of the following Advanced Practice Providers on your designated Care Team:   Murray Hodgkins, NP Christell Faith, PA-C Cadence Kathlen Mody, [place]    Other Instructions

## 2021-04-09 NOTE — Telephone Encounter (Signed)
-----   Message from Valerie Roys, DO sent at 03/30/2021  9:59 AM EDT ----- Call to check in on her BP

## 2021-06-22 ENCOUNTER — Other Ambulatory Visit: Payer: Self-pay | Admitting: Family Medicine

## 2021-06-22 NOTE — Telephone Encounter (Signed)
Requested Prescriptions  Pending Prescriptions Disp Refills   atorvastatin (LIPITOR) 20 MG tablet [Pharmacy Med Name: Atorvastatin Calcium 20 MG Oral Tablet] 90 tablet 3    Sig: TAKE 1 TABLET BY MOUTH AT  BEDTIME     Cardiovascular:  Antilipid - Statins Failed - 06/22/2021  5:51 AM      Failed - HDL in normal range and within 360 days    HDL  Date Value Ref Range Status  07/06/2020 39 (L) >39 mg/dL Final         Passed - Total Cholesterol in normal range and within 360 days    Cholesterol, Total  Date Value Ref Range Status  07/06/2020 121 100 - 199 mg/dL Final         Passed - LDL in normal range and within 360 days    LDL Chol Calc (NIH)  Date Value Ref Range Status  07/06/2020 68 0 - 99 mg/dL Final         Passed - Triglycerides in normal range and within 360 days    Triglycerides  Date Value Ref Range Status  07/06/2020 69 0 - 149 mg/dL Final         Passed - Patient is not pregnant      Passed - Valid encounter within last 12 months    Recent Outpatient Visits          2 months ago Hypertension, unspecified type   [facility] [place], Megan P, DO   3 months ago Hypertension, unspecified type   [employer], Megan P, DO   4 months ago Hypertension, unspecified type   [employer] [facility], [place], MD   4 months ago Benign hypertensive renal disease   [facility] [place], [place], DO   5 months ago Benign hypertensive renal disease   [facility] [place], Barb Merino, DO      Future Appointments            In 2 weeks Wynetta Emery, Barb Merino, DO [place], [place]   In 1 month  [employer], [facility]

## 2021-07-01 ENCOUNTER — Other Ambulatory Visit: Payer: Self-pay | Admitting: Family Medicine

## 2021-07-01 MED ORDER — CLOBETASOL PROPIONATE 0.05 % EX CREA: 1.0000 "application " | TOPICAL_CREAM | CUTANEOUS | 1 refills | Status: DC

## 2021-07-07 ENCOUNTER — Other Ambulatory Visit: Payer: Self-pay | Admitting: Family Medicine

## 2021-07-07 NOTE — Telephone Encounter (Signed)
Has newer rx at different mail order pharm.

## 2021-07-08 ENCOUNTER — Telehealth: Payer: Self-pay | Admitting: Family Medicine

## 2021-07-08 ENCOUNTER — Encounter: Payer: Self-pay | Admitting: Family Medicine

## 2021-07-08 ENCOUNTER — Other Ambulatory Visit: Payer: Self-pay

## 2021-07-08 ENCOUNTER — Ambulatory Visit (INDEPENDENT_AMBULATORY_CARE_PROVIDER_SITE_OTHER): Payer: Medicare Other | Admitting: Family Medicine

## 2021-07-08 VITALS — BP 121/83 | HR 68 | Temp 98.0°F | Ht 62.5 in | Wt 159.2 lb

## 2021-07-08 DIAGNOSIS — E782 Mixed hyperlipidemia: Secondary | ICD-10-CM

## 2021-07-08 DIAGNOSIS — Z1211 Encounter for screening for malignant neoplasm of colon: Secondary | ICD-10-CM | POA: Diagnosis not present

## 2021-07-08 DIAGNOSIS — I700 Atherosclerosis of aorta: Secondary | ICD-10-CM

## 2021-07-08 DIAGNOSIS — J449 Chronic obstructive pulmonary disease, unspecified: Secondary | ICD-10-CM | POA: Diagnosis not present

## 2021-07-08 DIAGNOSIS — Z13820 Encounter for screening for osteoporosis: Secondary | ICD-10-CM

## 2021-07-08 DIAGNOSIS — Z0000 Encounter for general adult medical examination without abnormal findings: Secondary | ICD-10-CM

## 2021-07-08 DIAGNOSIS — N3946 Mixed incontinence: Secondary | ICD-10-CM

## 2021-07-08 DIAGNOSIS — I129 Hypertensive chronic kidney disease with stage 1 through stage 4 chronic kidney disease, or unspecified chronic kidney disease: Secondary | ICD-10-CM

## 2021-07-08 DIAGNOSIS — D692 Other nonthrombocytopenic purpura: Secondary | ICD-10-CM | POA: Diagnosis not present

## 2021-07-08 DIAGNOSIS — Z23 Encounter for immunization: Secondary | ICD-10-CM | POA: Diagnosis not present

## 2021-07-08 DIAGNOSIS — N183 Chronic kidney disease, stage 3 unspecified: Secondary | ICD-10-CM

## 2021-07-08 LAB — MICROSCOPIC EXAMINATION

## 2021-07-08 LAB — URINALYSIS, ROUTINE W REFLEX MICROSCOPIC
Bilirubin, UA: NEGATIVE
Glucose, UA: NEGATIVE
Ketones, UA: NEGATIVE
Nitrite, UA: NEGATIVE
Protein,UA: NEGATIVE
Specific Gravity, UA: 1.025 (ref 1.005–1.030)
Urobilinogen, Ur: 0.2 mg/dL (ref 0.2–1.0)
pH, UA: 5.5 (ref 5.0–7.5)

## 2021-07-08 LAB — MICROALBUMIN, URINE WAIVED
Creatinine, Urine Waived: 300 mg/dL (ref 10–300)
Microalb, Ur Waived: 10 mg/L (ref 0–19)
Microalb/Creat Ratio: <30 mg/g (ref <30)

## 2021-07-08 MED ORDER — OMEPRAZOLE 20 MG PO CPDR: 20.0000 mg | DELAYED_RELEASE_CAPSULE | Freq: Every day | ORAL | 3 refills | Status: DC

## 2021-07-08 NOTE — Assessment & Plan Note (Signed)
Checking labs today. Await results. Treat as needed.  

## 2021-07-08 NOTE — Assessment & Plan Note (Signed)
Rechecking labs today. Await results. Treat as needed.  °

## 2021-07-08 NOTE — Assessment & Plan Note (Signed)
Under good control on current regimen. Continue current regimen. Continue to monitor. Call with any concerns. Refills given. Labs drawn today.   

## 2021-07-08 NOTE — Patient Instructions (Signed)
Please call to schedule your mammogram and/or bone density: °[facility]  °Address: [address], [place], [place] [address]  °Phone: [phone] ° °

## 2021-07-08 NOTE — Progress Notes (Signed)
BP 121/83    Pulse 68    Temp 98 F (36.7 C)    Ht 5' 2.5" (1.588 m)    Wt 159 lb 3.2 oz (72.2 kg)    SpO2 97%    BMI 28.65 kg/m    Subjective:    Patient ID: Jasmine Church, female    DOB: 1950-08-14, 71 y.o.   MRN: [id number]  HPI: Jasmine Church is a 71 y.o. female presenting on 07/08/2021 for comprehensive medical examination. Current medical complaints include:  HYPERTENSION / HYPERLIPIDEMIA Satisfied with current treatment? yes Duration of hypertension: chronic BP monitoring frequency: not checking BP medication side effects: no Past BP meds: losartan, lasix Duration of hyperlipidemia: chronic Cholesterol medication side effects: no Cholesterol supplements: none Past cholesterol medications: none Medication compliance: excellent compliance Aspirin: no Recent stressors: no Recurrent headaches: no Visual changes: no Palpitations: no Dyspnea: no Chest pain: no Lower extremity edema: no Dizzy/lightheaded: no  COPD COPD status: controlled Satisfied with current treatment?: yes Oxygen use: no Dyspnea frequency: none Cough frequency: none Rescue inhaler frequency: none   Limitation of activity: no Productive cough: none Pneumovax:  Given today Influenza: Up to Date  She currently lives with: husband Menopausal Symptoms: no  Depression Screen done today and results listed below:  Depression screen [facility] 2/9 07/08/2021 03/30/2021 02/03/2021 12/09/2020 08/07/2020  Decreased Interest 0 0 0 0 0  Down, Depressed, Hopeless 0 0 0 0 0  PHQ - 2 Score 0 0 0 0 0  Altered sleeping 0 0 - - -  Tired, decreased energy 0 3 - - -  Change in appetite 0 0 - - -  Feeling bad or failure about yourself  0 0 - - -  Trouble concentrating 0 0 - - -  Moving slowly or fidgety/restless 0 0 - - -  Suicidal thoughts 0 0 - - -  PHQ-9 Score 0 3 - - -    Past Medical History:  Past Medical History:  Diagnosis Date   Arthritis    CKD (chronic kidney disease) stage 3, GFR 30-59 ml/min  (HCC)    Hyperlipidemia    Hypertension    Vaginal atrophy     Surgical History:  Past Surgical History:  Procedure Laterality Date   HEMORROIDECTOMY     HERNIA REPAIR      Medications:  Current Outpatient Medications on File Prior to Visit  Medication Sig   aspirin EC 81 MG tablet Take 81 mg by mouth daily.   atorvastatin (LIPITOR) 20 MG tablet TAKE 1 TABLET BY MOUTH AT  BEDTIME   Calcium Carbonate-Vitamin D 500-125 MG-UNIT TABS Take by mouth daily. Patient takes it only 3 days a week.   clobetasol cream (TEMOVATE) 2.83 % Apply 1 application topically 2 (two) times a week.   furosemide (LASIX) 20 MG tablet Take 1 tablet (20 mg total) by mouth daily.   lisinopril (ZESTRIL) 10 MG tablet Take 1 tablet (10 mg total) by mouth daily.   polyethylene glycol powder (GLYCOLAX/MIRALAX) powder Take 17 g by mouth 3 (three) times daily as needed.   No current facility-administered medications on file prior to visit.    Allergies:  No Known Allergies  Social History:  Social History   Socioeconomic History   Marital status: Married    Spouse name: Not on file   Number of children: Not on file   Years of education: Not on file   Highest education level: Not on file  Occupational History   Not  on file  Tobacco Use   Smoking status: Former    Packs/day: 1.00    Years: 33.00    Pack years: 33.00    Types: Cigarettes    Quit date: 06/06/2004    Years since quitting: 17.0   Smokeless tobacco: Never  Vaping Use   Vaping Use: Never used  Substance and Sexual Activity   Alcohol use: No   Drug use: No   Sexual activity: Never  Other Topics Concern   Not on file  Social History Narrative   Not on file   Social Determinants of Health   Financial Resource Strain: Low Risk    Difficulty of Paying Living Expenses: Not hard at all  Food Insecurity: No Food Insecurity   Worried About Charity fundraiser in the Last Year: Never true   [place] in the Last Year: Never true   Transportation Needs: No Transportation Needs   Lack of Transportation (Medical): No   Lack of Transportation (Non-Medical): No  Physical Activity: Inactive   Days of Exercise per Week: 0 days   Minutes of Exercise per Session: 0 min  Stress: No Stress Concern Present   Feeling of Stress : Not at all  Social Connections: Not on file  Intimate Partner Violence: Not on file   Social History   Tobacco Use  Smoking Status Former   Packs/day: 1.00   Years: 33.00   Pack years: 33.00   Types: Cigarettes   Quit date: 06/06/2004   Years since quitting: 17.0  Smokeless Tobacco Never   Social History   Substance and Sexual Activity  Alcohol Use No    Family History:  Family History  Problem Relation Age of Onset   Arthritis Mother    Hyperlipidemia Mother    Hypertension Mother    Heart disease Father    Hypertension Father    Heart attack Father    Cancer Sister        uterus   Hypertension Sister    Heart disease Brother    Heart attack Brother    Hypertension Brother    Stroke Maternal Grandmother    Heart attack Brother    Hypertension Brother    Diabetes Neg Hx    Breast cancer Neg Hx     Past medical history, surgical history, medications, allergies, family history and social history reviewed with patient today and changes made to appropriate areas of the chart.   Review of Systems  Constitutional: Negative.   HENT: Negative.    Eyes: Negative.   Respiratory: Negative.    Cardiovascular: Negative.   Gastrointestinal:  Positive for constipation and diarrhea. Negative for abdominal pain, blood in stool, heartburn, melena, nausea and vomiting.  Genitourinary:  Positive for frequency. Negative for dysuria, flank pain, hematuria and urgency.  Musculoskeletal:  Positive for neck pain. Negative for back pain, falls, joint pain and myalgias.  Skin: Negative.   Neurological: Negative.   Endo/Heme/Allergies:  Negative for environmental allergies and polydipsia.  Bruises/bleeds easily.  Psychiatric/Behavioral: Negative.    All other ROS negative except what is listed above and in the HPI.      Objective:    BP 121/83    Pulse 68    Temp 98 F (36.7 C)    Ht 5' 2.5" (1.588 m)    Wt 159 lb 3.2 oz (72.2 kg)    SpO2 97%    BMI 28.65 kg/m   Wt Readings from Last 3 Encounters:  07/08/21 159  lb 3.2 oz (72.2 kg)  04/09/21 160 lb (72.6 kg)  03/30/21 157 lb (71.2 kg)    Physical Exam Vitals and nursing note reviewed.  Constitutional:      General: She is not in acute distress.    Appearance: Normal appearance. She is not ill-appearing, toxic-appearing or diaphoretic.  HENT:     Head: Normocephalic and atraumatic.     Right Ear: Tympanic membrane, ear canal and external ear normal. There is no impacted cerumen.     Left Ear: Tympanic membrane, ear canal and external ear normal. There is no impacted cerumen.     Nose: Nose normal. No congestion or rhinorrhea.     Mouth/Throat:     Mouth: Mucous membranes are moist.     Pharynx: Oropharynx is clear. No oropharyngeal exudate or posterior oropharyngeal erythema.  Eyes:     General: No scleral icterus.       Right eye: No discharge.        Left eye: No discharge.     Extraocular Movements: Extraocular movements intact.     Conjunctiva/sclera: Conjunctivae normal.     Pupils: Pupils are equal, round, and reactive to light.  Neck:     Vascular: No carotid bruit.  Cardiovascular:     Rate and Rhythm: Normal rate and regular rhythm.     Pulses: Normal pulses.     Heart sounds: No murmur heard.   No friction rub. No gallop.  Pulmonary:     Effort: Pulmonary effort is normal. No respiratory distress.     Breath sounds: Normal breath sounds. No stridor. No wheezing, rhonchi or rales.  Chest:     Chest wall: No tenderness.  Abdominal:     General: Abdomen is flat. Bowel sounds are normal. There is no distension.     Palpations: Abdomen is soft. There is no mass.     Tenderness: There is no abdominal  tenderness. There is no right CVA tenderness, left CVA tenderness, guarding or rebound.     Hernia: No hernia is present.  Genitourinary:    Comments: Breast and pelvic exams deferred with shared decision making Musculoskeletal:        General: No swelling, tenderness, deformity or signs of injury.     Cervical back: Normal range of motion and neck supple. No rigidity. No muscular tenderness.     Right lower leg: No edema.     Left lower leg: No edema.  Lymphadenopathy:     Cervical: No cervical adenopathy.  Skin:    General: Skin is warm and dry.     Capillary Refill: Capillary refill takes less than 2 seconds.     Coloration: Skin is not jaundiced or pale.     Findings: No bruising, erythema, lesion or rash.  Neurological:     General: No focal deficit present.     Mental Status: She is alert and oriented to person, place, and time. Mental status is at baseline.     Cranial Nerves: No cranial nerve deficit.     Sensory: No sensory deficit.     Motor: No weakness.     Coordination: Coordination normal.     Gait: Gait normal.     Deep Tendon Reflexes: Reflexes normal.  Psychiatric:        Mood and Affect: Mood normal.        Behavior: Behavior normal.        Thought Content: Thought content normal.        Judgment: Judgment  normal.    Results for orders placed or performed in visit on 07/08/21  Microscopic Examination   Urine  Result Value Ref Range   WBC, UA 0-5 0 - 5 /hpf   RBC 3-10 (A) 0 - 2 /hpf   Epithelial Cells (non renal) 0-10 0 - 10 /hpf   Mucus, UA Present (A) Not Estab.   Bacteria, UA Few (A) None seen/Few  Urinalysis, Routine w reflex microscopic  Result Value Ref Range   Specific Gravity, UA 1.025 1.005 - 1.030   pH, UA 5.5 5.0 - 7.5   Color, UA Yellow Yellow   Appearance Ur Clear Clear   Leukocytes,UA 1+ (A) Negative   Protein,UA Negative Negative/Trace   Glucose, UA Negative Negative   Ketones, UA Negative Negative   RBC, UA 2+ (A) Negative    Bilirubin, UA Negative Negative   Urobilinogen, Ur 0.2 0.2 - 1.0 mg/dL   Nitrite, UA Negative Negative   Microscopic Examination See below:   Microalbumin, Urine Waived  Result Value Ref Range   Microalb, Ur Waived 10 0 - 19 mg/L   Creatinine, Urine Waived 300 10 - 300 mg/dL   Microalb/Creat Ratio <30 <30 mg/g      Assessment & Plan:   Problem List Items Addressed This Visit       Cardiovascular and Mediastinum   Aortic atherosclerosis ([place])    Will keep BP and cholesterol under good control. Continue to monitor.       Relevant Orders   CBC with Differential/Platelet   Comprehensive metabolic panel   Senile purpura ([place])    Reassured patient. Continue to monitor. Call with any concerns.         Respiratory   COPD (chronic obstructive pulmonary disease) (HCC)    Stable off medicine. Continue to monitor. Call with any concerns.       Relevant Orders   CBC with Differential/Platelet   Comprehensive metabolic panel     Genitourinary   Benign hypertensive renal disease    Under good control on current regimen. Continue current regimen. Continue to monitor. Call with any concerns. Refills given. Labs drawn today.        Relevant Orders   CBC with Differential/Platelet   Comprehensive metabolic panel   TSH   Microalbumin, Urine Waived (Completed)   CKD (chronic kidney disease) stage 3, GFR 30-59 ml/min (HCC)    Rechecking labs today. Await results. Treat as needed.       Relevant Orders   CBC with Differential/Platelet   Comprehensive metabolic panel     Other   Hyperlipidemia    Under good control on current regimen. Continue current regimen. Continue to monitor. Call with any concerns. Refills given. Labs drawn today.        Relevant Orders   CBC with Differential/Platelet   Comprehensive metabolic panel   Lipid Panel w/o Chol/HDL Ratio   Mixed stress and urge urinary incontinence    Checking labs today. Await results. Treat as needed.       Relevant  Orders   CBC with Differential/Platelet   Comprehensive metabolic panel   Urinalysis, Routine w reflex microscopic (Completed)   Other Visit Diagnoses     Routine general medical examination at a health care facility    -  Primary   Vaccines updated. Screening labs checked today. Mammo up to date. DEXA and cologuard ordered today. Continue diet and exercise. Call with any concerns.    Screening for osteoporosis  DEXA ordered today.   Relevant Orders   DG Bone Density   Screening for colon cancer       Cologuard ordered today.   Relevant Orders   Cologuard        Follow up plan: Return in about 6 months (around 01/05/2022).   LABORATORY TESTING:  - Pap smear: not applicable  IMMUNIZATIONS:   - Tdap: Tetanus vaccination status reviewed: last tetanus booster within 10 years. - Influenza: Up to date - Pneumovax: Administered today - Prevnar: Up to date - COVID: Up to date - HPV: Not applicable - Shingrix vaccine: Given elsewhere  SCREENING: -Mammogram: Up to date  - Colonoscopy: Ordered today  - Bone Density: Ordered today   PATIENT COUNSELING:   Advised to take 1 mg of folate supplement per day if capable of pregnancy.   Sexuality: Discussed sexually transmitted diseases, partner selection, use of condoms, avoidance of unintended pregnancy  and contraceptive alternatives.   Advised to avoid cigarette smoking.  I discussed with the patient that most people either abstain from alcohol or drink within safe limits (<=14/week and <=4 drinks/occasion for males, <=7/weeks and <= 3 drinks/occasion for females) and that the risk for alcohol disorders and other health effects rises proportionally with the number of drinks per week and how often a drinker exceeds daily limits.  Discussed cessation/primary prevention of drug use and availability of treatment for abuse.   Diet: Encouraged to adjust caloric intake to maintain  or achieve ideal body weight, to reduce intake of  dietary saturated fat and total fat, to limit sodium intake by avoiding high sodium foods and not adding table salt, and to maintain adequate dietary potassium and calcium preferably from fresh fruits, vegetables, and low-fat dairy products.    stressed the importance of regular exercise  Injury prevention: Discussed safety belts, safety helmets, smoke detector, smoking near bedding or upholstery.   Dental health: Discussed importance of regular tooth brushing, flossing, and dental visits.    NEXT PREVENTATIVE PHYSICAL DUE IN 1 YEAR. Return in about 6 months (around 01/05/2022).

## 2021-07-08 NOTE — Telephone Encounter (Signed)
Copied from [place] [id number]. Topic: Appointment Scheduling - Scheduling Inquiry for Clinic >> Jul 08, 2021 11:09 AM Valere Dross wrote: Reason for CRM: Pt called in stating for her bone density, she normally gets it done in [place] and would like it sent there, instead of the [place] office. Please advise.

## 2021-07-08 NOTE — Assessment & Plan Note (Signed)
Stable off medicine. Continue to monitor. Call with any concerns.  

## 2021-07-08 NOTE — Assessment & Plan Note (Signed)
Will keep BP and cholesterol under good control. Continue to monitor.  

## 2021-07-08 NOTE — Assessment & Plan Note (Signed)
Reassured patient. Continue to monitor. Call with any concerns.  

## 2021-07-08 NOTE — Telephone Encounter (Signed)
Please send DEXA order to [facility]. Thanks!

## 2021-07-09 ENCOUNTER — Encounter: Payer: Self-pay | Admitting: Family Medicine

## 2021-07-09 LAB — CBC WITH DIFFERENTIAL/PLATELET
Basophils Absolute: 0.1 10*3/uL (ref 0.0–0.2)
Basos: 1 %
EOS (ABSOLUTE): 0.3 10*3/uL (ref 0.0–0.4)
Eos: 6 %
Hematocrit: 44.6 % (ref 34.0–46.6)
Hemoglobin: 14.5 g/dL (ref 11.1–15.9)
Immature Grans (Abs): 0 10*3/uL (ref 0.0–0.1)
Immature Granulocytes: 0 %
Lymphocytes Absolute: 1.6 10*3/uL (ref 0.7–3.1)
Lymphs: 32 %
MCH: 27.8 pg (ref 26.6–33.0)
MCHC: 32.5 g/dL (ref 31.5–35.7)
MCV: 86 fL (ref 79–97)
Monocytes Absolute: 0.5 10*3/uL (ref 0.1–0.9)
Monocytes: 11 %
Neutrophils Absolute: 2.4 10*3/uL (ref 1.4–7.0)
Neutrophils: 50 %
Platelets: 263 10*3/uL (ref 150–450)
RBC: 5.21 x10E6/uL (ref 3.77–5.28)
RDW: 13.6 % (ref 11.7–15.4)
WBC: 4.9 10*3/uL (ref 3.4–10.8)

## 2021-07-09 LAB — COMPREHENSIVE METABOLIC PANEL
ALT: 15 IU/L (ref 0–32)
AST: 22 IU/L (ref 0–40)
Albumin/Globulin Ratio: 1.9 (ref 1.2–2.2)
Albumin: 4.4 g/dL (ref 3.8–4.8)
Alkaline Phosphatase: 106 IU/L (ref 44–121)
BUN/Creatinine Ratio: 10 — ABNORMAL LOW (ref 12–28)
BUN: 13 mg/dL (ref 8–27)
Bilirubin Total: 0.6 mg/dL (ref 0.0–1.2)
CO2: 23 mmol/L (ref 20–29)
Calcium: 9.2 mg/dL (ref 8.7–10.3)
Chloride: 102 mmol/L (ref 96–106)
Creatinine, Ser: 1.24 mg/dL — ABNORMAL HIGH (ref 0.57–1.00)
Globulin, Total: 2.3 g/dL (ref 1.5–4.5)
Glucose: 86 mg/dL (ref 70–99)
Potassium: 4.6 mmol/L (ref 3.5–5.2)
Sodium: 142 mmol/L (ref 134–144)
Total Protein: 6.7 g/dL (ref 6.0–8.5)
eGFR: 47 mL/min/{1.73_m2} — ABNORMAL LOW (ref >59)

## 2021-07-09 LAB — LIPID PANEL W/O CHOL/HDL RATIO
Cholesterol, Total: 159 mg/dL (ref 100–199)
HDL: 49 mg/dL (ref >39)
LDL Chol Calc (NIH): 96 mg/dL (ref 0–99)
Triglycerides: 71 mg/dL (ref 0–149)
VLDL Cholesterol Cal: 14 mg/dL (ref 5–40)

## 2021-07-09 LAB — TSH: TSH: 3.17 u[IU]/mL (ref 0.450–4.500)

## 2021-07-21 DIAGNOSIS — Z1211 Encounter for screening for malignant neoplasm of colon: Secondary | ICD-10-CM | POA: Diagnosis not present

## 2021-07-28 LAB — COLOGUARD: COLOGUARD: NEGATIVE

## 2021-07-29 NOTE — Progress Notes (Signed)
Letter generated prior to result..  Letter was not sent to patient.

## 2021-08-09 ENCOUNTER — Ambulatory Visit (INDEPENDENT_AMBULATORY_CARE_PROVIDER_SITE_OTHER): Payer: Medicare Other | Admitting: *Deleted

## 2021-08-09 DIAGNOSIS — Z780 Asymptomatic menopausal state: Secondary | ICD-10-CM | POA: Diagnosis not present

## 2021-08-09 DIAGNOSIS — Z0000 Encounter for general adult medical examination without abnormal findings: Secondary | ICD-10-CM | POA: Diagnosis not present

## 2021-08-09 NOTE — Patient Instructions (Signed)
[name] , Thank you for taking time to come for your Medicare Wellness Visit. I appreciate your ongoing commitment to your health goals. Please review the following plan we discussed and let me know if I can assist you in the future.   Screening recommendations/referrals: Colonoscopy: up to date Mammogram: up to date Bone Density: up to date Recommended yearly ophthalmology/optometry visit for glaucoma screening and checkup Recommended yearly dental visit for hygiene and checkup  Vaccinations: Influenza vaccine: up to date Pneumococcal vaccine: up to date Tdap vaccine: up to date Shingles vaccine: Education provided    Advanced directives: Education provided  Conditions/risks identified:   Next appointment: 01-05-2022 9:40 [place] 70 Years and Older, Female Preventive care refers to lifestyle choices and visits with your health care provider that can promote health and wellness. What does preventive care include? A yearly physical exam. This is also called an annual well check. Dental exams once or twice a year. Routine eye exams. Ask your health care provider how often you should have your eyes checked. Personal lifestyle choices, including: Daily care of your teeth and gums. Regular physical activity. Eating a healthy diet. Avoiding tobacco and drug use. Limiting alcohol use. Practicing safe sex. Taking low-dose aspirin every day. Taking vitamin and mineral supplements as recommended by your health care provider. What happens during an annual well check? The services and screenings done by your health care provider during your annual well check will depend on your age, overall health, lifestyle risk factors, and family history of disease. Counseling  Your health care provider may ask you questions about your: Alcohol use. Tobacco use. Drug use. Emotional well-being. Home and relationship well-being. Sexual activity. Eating habits. History of  falls. Memory and ability to understand (cognition). Work and work Statistician. Reproductive health. Screening  You may have the following tests or measurements: Height, weight, and BMI. Blood pressure. Lipid and cholesterol levels. These may be checked every 5 years, or more frequently if you are over 40 years old. Skin check. Lung cancer screening. You may have this screening every year starting at age 70 if you have a 30-pack-year history of smoking and currently smoke or have quit within the past 15 years. Fecal occult blood test (FOBT) of the stool. You may have this test every year starting at age 70. Flexible sigmoidoscopy or colonoscopy. You may have a sigmoidoscopy every 5 years or a colonoscopy every 10 years starting at age 55. Hepatitis C blood test. Hepatitis B blood test. Sexually transmitted disease (STD) testing. Diabetes screening. This is done by checking your blood sugar (glucose) after you have not eaten for a while (fasting). You may have this done every 1-3 years. Bone density scan. This is done to screen for osteoporosis. You may have this done starting at age 70. Mammogram. This may be done every 1-2 years. Talk to your health care provider about how often you should have regular mammograms. Talk with your health care provider about your test results, treatment options, and if necessary, the need for more tests. Vaccines  Your health care provider may recommend certain vaccines, such as: Influenza vaccine. This is recommended every year. Tetanus, diphtheria, and acellular pertussis (Tdap, Td) vaccine. You may need a Td booster every 10 years. Zoster vaccine. You may need this after age 70. Pneumococcal 13-valent conjugate (PCV13) vaccine. One dose is recommended after age 70. Pneumococcal polysaccharide (PPSV23) vaccine. One dose is recommended after age 70. Talk to your health care provider about  which screenings and vaccines you need and how often you need  them. This information is not intended to replace advice given to you by your health care provider. Make sure you discuss any questions you have with your health care provider. Document Released: 06/19/2015 Document Revised: 02/10/2016 Document Reviewed: 03/24/2015 Elsevier Interactive Patient Education  2017 [employer] Prevention in the Home Falls can cause injuries. They can happen to people of all ages. There are many things you can do to make your home safe and to help prevent falls. What can I do on the outside of my home? Regularly fix the edges of walkways and driveways and fix any cracks. Remove anything that might make you trip as you walk through a door, such as a raised step or threshold. Trim any bushes or trees on the path to your home. Use bright outdoor lighting. Clear any walking paths of anything that might make someone trip, such as rocks or tools. Regularly check to see if handrails are loose or broken. Make sure that both sides of any steps have handrails. Any raised decks and porches should have guardrails on the edges. Have any leaves, snow, or ice cleared regularly. Use sand or salt on walking paths during winter. Clean up any spills in your garage right away. This includes oil or grease spills. What can I do in the bathroom? Use night lights. Install grab bars by the toilet and in the tub and shower. Do not use towel bars as grab bars. Use non-skid mats or decals in the tub or shower. If you need to sit down in the shower, use a plastic, non-slip stool. Keep the floor dry. Clean up any water that spills on the floor as soon as it happens. Remove soap buildup in the tub or shower regularly. Attach bath mats securely with double-sided non-slip rug tape. Do not have throw rugs and other things on the floor that can make you trip. What can I do in the bedroom? Use night lights. Make sure that you have a light by your bed that is easy to reach. Do not use  any sheets or blankets that are too big for your bed. They should not hang down onto the floor. Have a firm chair that has side arms. You can use this for support while you get dressed. Do not have throw rugs and other things on the floor that can make you trip. What can I do in the kitchen? Clean up any spills right away. Avoid walking on wet floors. Keep items that you use a lot in easy-to-reach places. If you need to reach something above you, use a strong step stool that has a grab bar. Keep electrical cords out of the way. Do not use floor polish or wax that makes floors slippery. If you must use wax, use non-skid floor wax. Do not have throw rugs and other things on the floor that can make you trip. What can I do with my stairs? Do not leave any items on the stairs. Make sure that there are handrails on both sides of the stairs and use them. Fix handrails that are broken or loose. Make sure that handrails are as long as the stairways. Check any carpeting to make sure that it is firmly attached to the stairs. Fix any carpet that is loose or worn. Avoid having throw rugs at the top or bottom of the stairs. If you do have throw rugs, attach them to the floor with  carpet tape. Make sure that you have a light switch at the top of the stairs and the bottom of the stairs. If you do not have them, ask someone to add them for you. What else can I do to help prevent falls? Wear shoes that: Do not have high heels. Have rubber bottoms. Are comfortable and fit you well. Are closed at the toe. Do not wear sandals. If you use a stepladder: Make sure that it is fully opened. Do not climb a closed stepladder. Make sure that both sides of the stepladder are locked into place. Ask someone to hold it for you, if possible. Clearly mark and make sure that you can see: Any grab bars or handrails. First and last steps. Where the edge of each step is. Use tools that help you move around (mobility aids)  if they are needed. These include: Canes. Walkers. Scooters. Crutches. Turn on the lights when you go into a dark area. Replace any light bulbs as soon as they burn out. Set up your furniture so you have a clear path. Avoid moving your furniture around. If any of your floors are uneven, fix them. If there are any pets around you, be aware of where they are. Review your medicines with your doctor. Some medicines can make you feel dizzy. This can increase your chance of falling. Ask your doctor what other things that you can do to help prevent falls. This information is not intended to replace advice given to you by your health care provider. Make sure you discuss any questions you have with your health care provider. Document Released: 03/19/2009 Document Revised: 10/29/2015 Document Reviewed: 06/27/2014 Elsevier Interactive Patient Education  2017 [employer].

## 2021-08-09 NOTE — Progress Notes (Signed)
Subjective:   Jasmine Church is a 71 y.o. female who presents for Medicare Annual (Subsequent) preventive examination.  I connected with  Emersen Mascari on 08/09/21 by a telephone  enabled telemedicine application and verified that I am speaking with the correct person using two identifiers.   I discussed the limitations of evaluation and management by telemedicine. The patient expressed understanding and agreed to proceed.  Patient location: home  Provider location: Tele-Health not in office       Review of Systems     Cardiac Risk Factors include: advanced age (>66mn, >>85women)     Objective:    Today's Vitals   There is no height or weight on file to calculate BMI.  Advanced Directives 08/09/2021 08/07/2020 08/05/2019  Does Patient Have a Medical Advance Directive? No No No  Would patient like information on creating a medical advance directive? No - Patient declined - -    Current Medications (verified) Outpatient Encounter Medications as of 08/09/2021  Medication Sig   aspirin EC 81 MG tablet Take 81 mg by mouth daily.   atorvastatin (LIPITOR) 20 MG tablet TAKE 1 TABLET BY MOUTH AT  BEDTIME   Calcium Carbonate-Vitamin D 500-125 MG-UNIT TABS Take by mouth daily. Patient takes it only 3 days a week.   clobetasol cream (TEMOVATE) 02.54% Apply 1 application topically 2 (two) times a week.   lisinopril (ZESTRIL) 10 MG tablet Take 1 tablet (10 mg total) by mouth daily.   omeprazole (PRILOSEC) 20 MG capsule Take 1 capsule (20 mg total) by mouth daily.   polyethylene glycol powder (GLYCOLAX/MIRALAX) powder Take 17 g by mouth 3 (three) times daily as needed.   furosemide (LASIX) 20 MG tablet Take 1 tablet (20 mg total) by mouth daily.   [DISCONTINUED] atorvastatin (LIPITOR) 20 MG tablet Take 1 tablet (20 mg total) by mouth at bedtime.   [DISCONTINUED] clobetasol cream (TEMOVATE) 02.70% Apply 1 application topically 2 (two) times a week.   [DISCONTINUED] hydrALAZINE  (APRESOLINE) 25 MG tablet Take 1 tablet (25 mg total) by mouth daily.   [DISCONTINUED] lisinopril (ZESTRIL) 40 MG tablet Take 1 tablet (40 mg total) by mouth daily. (Patient not taking: Reported on 02/26/2021)   [DISCONTINUED] omeprazole (PRILOSEC) 20 MG capsule Take 1 capsule (20 mg total) by mouth daily.   No facility-administered encounter medications on file as of 08/09/2021.    Allergies (verified) Patient has no known allergies.   History: Past Medical History:  Diagnosis Date   Arthritis    CKD (chronic kidney disease) stage 3, GFR 30-59 ml/min (HCC)    Hyperlipidemia    Hypertension    Vaginal atrophy    Past Surgical History:  Procedure Laterality Date   HEMORROIDECTOMY     HERNIA REPAIR     Family History  Problem Relation Age of Onset   Arthritis Mother    Hyperlipidemia Mother    Hypertension Mother    Heart disease Father    Hypertension Father    Heart attack Father    Cancer Sister        uterus   Hypertension Sister    Heart disease Brother    Heart attack Brother    Hypertension Brother    Stroke Maternal Grandmother    Heart attack Brother    Hypertension Brother    Diabetes Neg Hx    Breast cancer Neg Hx    Social History   Socioeconomic History   Marital status: Married  Spouse name: Not on file   Number of children: Not on file   Years of education: Not on file   Highest education level: Not on file  Occupational History   Not on file  Tobacco Use   Smoking status: Former    Packs/day: 1.00    Years: 33.00    Pack years: 33.00    Types: Cigarettes    Quit date: 06/06/2004    Years since quitting: 17.1   Smokeless tobacco: Never  Vaping Use   Vaping Use: Never used  Substance and Sexual Activity   Alcohol use: No   Drug use: No   Sexual activity: Never  Other Topics Concern   Not on file  Social History Narrative   Not on file   Social Determinants of Health   Financial Resource Strain: Low Risk    Difficulty of Paying  Living Expenses: Not hard at all  Food Insecurity: No Food Insecurity   Worried About Charity fundraiser in the Last Year: Never true   [place] in the Last Year: Never true  Transportation Needs: No Transportation Needs   Lack of Transportation (Medical): No   Lack of Transportation (Non-Medical): No  Physical Activity: Sufficiently Active   Days of Exercise per Week: 7 days   Minutes of Exercise per Session: 30 min  Stress: No Stress Concern Present   Feeling of Stress : Not at all  Social Connections: Moderately Integrated   Frequency of Communication with Friends and Family: More than three times a week   Frequency of Social Gatherings with Friends and Family: Twice a week   Attends Religious Services: More than 4 times per year   Active Member of [employer] or Organizations: No   Attends Music therapist: Never   Marital Status: Married    Tobacco Counseling Counseling given: Not Answered   Clinical Intake:  Pre-visit preparation completed: Yes  Pain : No/denies pain     Nutritional Risks: None Diabetes: No  How often do you need to have someone help you when you read instructions, pamphlets, or other written materials from your doctor or pharmacy?: 1 - Never  Diabetic?  no  Interpreter Needed?: No  Information entered by :: Leroy Kennedy LPN   Activities of Daily Living In your present state of health, do you have any difficulty performing the following activities: 08/09/2021 07/08/2021  Hearing? N N  Vision? N N  Difficulty concentrating or making decisions? N N  Walking or climbing stairs? N N  Dressing or bathing? N N  Doing errands, shopping? N N  Preparing Food and eating ? N -  Using the Toilet? N -  In the past six months, have you accidently leaked urine? N -  Do you have problems with loss of bowel control? N -  Managing your Medications? N -  Managing your Finances? N -  Housekeeping or managing your Housekeeping? N -  Some recent  data might be hidden    Patient Care Team: Valerie Roys, DO as PCP - General (Family Medicine) Kate Sable, MD as Consulting Physician (Cardiology)  Indicate any recent Medical Services you may have received from other than [facility] providers in the past year (date may be approximate).     Assessment:   This is a routine wellness examination for Bindi.  Hearing/Vision screen Hearing Screening - Comments:: No trouble hearing Vision Screening - Comments:: [place] Up to date  Dietary issues and exercise activities  discussed: Current Exercise Habits: Home exercise routine (bicycle), Time (Minutes): 30, Frequency (Times/Week): 6, Weekly Exercise (Minutes/Week): 180, Intensity: Moderate   Goals Addressed             This Visit's Progress    Patient Stated       Continue current lifestyle       Depression Screen PHQ 2/9 Scores 08/09/2021 07/08/2021 03/30/2021 02/03/2021 12/09/2020 08/07/2020 07/06/2020  PHQ - 2 Score 0 0 0 0 0 0 0  PHQ- 9 Score 0 0 3 - - - -    Fall Risk Fall Risk  08/09/2021 08/09/2021 07/08/2021 02/03/2021 12/21/2020  Falls in the past year? 0 0 0 0 -  Number falls in past yr: 0 0 0 0 0  Injury with Fall? 0 0 0 0 0  Risk for fall due to : - - No Fall Risks No Fall Risks No Fall Risks  Follow up Falls evaluation completed Falls evaluation completed;Falls prevention discussed;Education provided Falls evaluation completed Falls evaluation completed Falls evaluation completed    FALL RISK PREVENTION PERTAINING TO THE HOME:  Any stairs in or around the home? Yes  If so, are there any without handrails? No  Home free of loose throw rugs in walkways, pet beds, electrical cords, etc? Yes  Adequate lighting in your home to reduce risk of falls? Yes   ASSISTIVE DEVICES UTILIZED TO PREVENT FALLS:  Life alert? No  Use of a cane, walker or w/c? No  Grab bars in the bathroom? No  Shower chair or bench in shower? Yes  Elevated toilet seat or a handicapped  toilet? No   TIMED UP AND GO:  Was the test performed? No .    Cognitive Function:  Normal cognitive status assessed by direct observation by this Nurse Health Advisor. No abnormalities found.       6CIT Screen 08/07/2020 04/23/2019 04/12/2018  What Year? 0 points 0 points 0 points  What month? 0 points 0 points 0 points  What time? 0 points 0 points 0 points  Count back from 20 0 points 0 points 0 points  Months in reverse 0 points 0 points 0 points  Repeat phrase 4 points 0 points 2 points  Total Score 4 0 2    Immunizations Immunization History  Administered Date(s) Administered   Fluad Quad(high Dose 65+) 02/18/2019, 03/02/2020, 03/01/2021   Influenza, High Dose Seasonal PF 03/12/2018   Influenza,inj,Quad PF,6+ Mos 04/21/2016, 03/07/2017   Pneumococcal Conjugate-13 03/06/2019   Pneumococcal Polysaccharide-23 07/08/2021   Tdap 03/02/2016    TDAP status: Up to date  Flu Vaccine status: Up to date  Pneumococcal vaccine status: Up to date  Covid-19 vaccine status: Information provided on how to obtain vaccines.   Qualifies for Shingles Vaccine? No   Zostavax completed No   Shingrix Completed?: No.    Education has been provided regarding the importance of this vaccine. Patient has been advised to call insurance company to determine out of pocket expense if they have not yet received this vaccine. Advised may also receive vaccine at local pharmacy or Health Dept. Verbalized acceptance and understanding.  Screening Tests Health Maintenance  Topic Date Due   DEXA SCAN  06/05/2021   COVID-19 Vaccine (1) 08/25/2021 (Originally 10/12/1951)   Zoster Vaccines- Shingrix (1 of 2) 11/09/2021 (Originally 04/15/1970)   MAMMOGRAM  03/04/2023   Fecal DNA (Cologuard)  07/21/2024   TETANUS/TDAP  03/02/2026   Pneumonia Vaccine 48+ Years old  Completed   INFLUENZA VACCINE  Completed  Hepatitis C Screening  Completed   HPV VACCINES  Aged Out    Health Maintenance  Health  Maintenance Due  Topic Date Due   DEXA SCAN  06/05/2021    Colorectal cancer screening: Type of screening: Cologuard. Completed 2023. Repeat every 3 years  Mammogram status: Completed  . Repeat every year  Bone Density status: Ordered  . Pt provided with contact info and advised to call to schedule appt.  Lung Cancer Screening: (Low Dose CT Chest recommended if Age 79-80 years, 30 pack-year currently smoking OR have quit w/in 15years.) does not qualify.   Lung Cancer Screening Referral:   Additional Screening:  Hepatitis C Screening: does not qualify; Completed 2018  Vision Screening: Recommended annual ophthalmology exams for early detection of glaucoma and other disorders of the eye. Is the patient up to date with their annual eye exam?  Yes  Who is the provider or what is the name of the office in which the patient attends annual eye exams? [place] If pt is not established with a provider, would they like to be referred to a provider to establish care? No .   Dental Screening: Recommended annual dental exams for proper oral hygiene  Community Resource Referral / Chronic Care Management: CRR required this visit?  No   CCM required this visit?  No      Plan:     I have personally reviewed and noted the following in the patients chart:   Medical and social history Use of alcohol, tobacco or illicit drugs  Current medications and supplements including opioid prescriptions.  Functional ability and status Nutritional status Physical activity Advanced directives List of other physicians Hospitalizations, surgeries, and ER visits in previous 12 months Vitals Screenings to include cognitive, depression, and falls Referrals and appointments  In addition, I have reviewed and discussed with patient certain preventive protocols, quality metrics, and best practice recommendations. A written personalized care plan for preventive services as well as general  preventive health recommendations were provided to patient.     Leroy Kennedy, LPN   09/09/5033   Nurse Notes:

## 2021-09-07 ENCOUNTER — Other Ambulatory Visit: Payer: Self-pay

## 2021-09-07 MED ORDER — LISINOPRIL 10 MG PO TABS: 10.0000 mg | ORAL_TABLET | Freq: Every day | ORAL | 1 refills | Status: DC

## 2021-09-10 ENCOUNTER — Other Ambulatory Visit: Payer: Self-pay | Admitting: Family Medicine

## 2021-09-10 DIAGNOSIS — Z1231 Encounter for screening mammogram for malignant neoplasm of breast: Secondary | ICD-10-CM

## 2021-12-10 ENCOUNTER — Other Ambulatory Visit: Payer: Self-pay

## 2021-12-10 NOTE — Telephone Encounter (Signed)
Call dropped

## 2021-12-24 ENCOUNTER — Other Ambulatory Visit: Payer: Self-pay | Admitting: Family Medicine

## 2021-12-24 NOTE — Telephone Encounter (Signed)
Requested Prescriptions  Pending Prescriptions Disp Refills  . atorvastatin (LIPITOR) 20 MG tablet [Pharmacy Med Name: Atorvastatin Calcium 20 MG Oral Tablet] 100 tablet 2    Sig: TAKE 1 TABLET BY MOUTH AT  BEDTIME     Cardiovascular:  Antilipid - Statins Failed - 12/24/2021  7:10 AM      Failed - Lipid Panel in normal range within the last 12 months    Cholesterol, Total  Date Value Ref Range Status  07/08/2021 159 100 - 199 mg/dL Final   LDL Chol Calc (NIH)  Date Value Ref Range Status  07/08/2021 96 0 - 99 mg/dL Final   HDL  Date Value Ref Range Status  07/08/2021 49 >39 mg/dL Final   Triglycerides  Date Value Ref Range Status  07/08/2021 71 0 - 149 mg/dL Final         Passed - Patient is not pregnant      Passed - Valid encounter within last 12 months    Recent Outpatient Visits          5 months ago Routine general medical examination at a health care facility   [facility], [place] P, DO   8 months ago Hypertension, unspecified type   [facility], Megan P, DO   9 months ago Hypertension, unspecified type   [facility], Megan P, DO   10 months ago Hypertension, unspecified type   [place], MD   10 months ago Benign hypertensive renal disease   [facility] [place], [place], DO      Future Appointments            In 1 week Wynetta Emery, Barb Merino, DO [employer], [facility]

## 2021-12-29 ENCOUNTER — Other Ambulatory Visit: Payer: Self-pay

## 2021-12-29 NOTE — Telephone Encounter (Signed)
Opened in Error.

## 2022-01-05 ENCOUNTER — Ambulatory Visit (INDEPENDENT_AMBULATORY_CARE_PROVIDER_SITE_OTHER): Payer: Medicare Other | Admitting: Family Medicine

## 2022-01-05 ENCOUNTER — Encounter: Payer: Self-pay | Admitting: Family Medicine

## 2022-01-05 VITALS — BP 119/78 | HR 78 | Temp 97.9°F | Wt 157.8 lb

## 2022-01-05 DIAGNOSIS — E782 Mixed hyperlipidemia: Secondary | ICD-10-CM

## 2022-01-05 DIAGNOSIS — N952 Postmenopausal atrophic vaginitis: Secondary | ICD-10-CM | POA: Diagnosis not present

## 2022-01-05 DIAGNOSIS — I129 Hypertensive chronic kidney disease with stage 1 through stage 4 chronic kidney disease, or unspecified chronic kidney disease: Secondary | ICD-10-CM | POA: Diagnosis not present

## 2022-01-05 MED ORDER — CLOBETASOL PROPIONATE 0.05 % EX CREA
1.0000 | TOPICAL_CREAM | CUTANEOUS | 1 refills | Status: DC
Start: 2022-01-06 — End: 2022-08-29

## 2022-01-05 MED ORDER — FUROSEMIDE 20 MG PO TABS: 20.0000 mg | ORAL_TABLET | Freq: Every day | ORAL | 1 refills | Status: AC

## 2022-01-05 MED ORDER — LISINOPRIL 10 MG PO TABS
10.0000 mg | ORAL_TABLET | Freq: Every day | ORAL | 1 refills | Status: DC
Start: 2022-01-05 — End: 2022-06-16

## 2022-01-05 NOTE — Progress Notes (Signed)
BP 119/78   Pulse 78   Temp 97.9 F (36.6 C)   Wt 157 lb 12.8 oz (71.6 kg)   SpO2 100%   BMI 28.40 kg/m    Subjective:    Patient ID: Jasmine Church, female    DOB: 02/25/1951, 71 y.o.   MRN: [id number]  HPI: Jasmine Church is a 71 y.o. female  Chief Complaint  Patient presents with   Hypertension   Hyperlipidemia   HYPERTENSION / [place] Satisfied with current treatment? yes Duration of hypertension: chronic BP monitoring frequency: not checking BP medication side effects: no Past BP meds: lasix, lisinopril Duration of hyperlipidemia: chronic Cholesterol medication side effects: no Cholesterol supplements: none Past cholesterol medications: atorvastatin Medication compliance: excellent compliance Aspirin: no Recent stressors: no Recurrent headaches: no Visual changes: no Palpitations: no Dyspnea: no Chest pain: no Lower extremity edema: no Dizzy/lightheaded: no  Relevant past medical, surgical, family and social history reviewed and updated as indicated. Interim medical history since our last visit reviewed. Allergies and medications reviewed and updated.  Review of Systems  Constitutional: Negative.   Respiratory: Negative.    Cardiovascular: Negative.   Gastrointestinal: Negative.   Musculoskeletal: Negative.   Neurological: Negative.   Psychiatric/Behavioral: Negative.      Per HPI unless specifically indicated above     Objective:    BP 119/78   Pulse 78   Temp 97.9 F (36.6 C)   Wt 157 lb 12.8 oz (71.6 kg)   SpO2 100%   BMI 28.40 kg/m   Wt Readings from Last 3 Encounters:  01/05/22 157 lb 12.8 oz (71.6 kg)  07/08/21 159 lb 3.2 oz (72.2 kg)  04/09/21 160 lb (72.6 kg)    Physical Exam Vitals and nursing note reviewed.  Constitutional:      General: She is not in acute distress.    Appearance: Normal appearance. She is not ill-appearing, toxic-appearing or diaphoretic.  HENT:     Head: Normocephalic and atraumatic.      Right Ear: External ear normal.     Left Ear: External ear normal.     Nose: Nose normal.     Mouth/Throat:     Mouth: Mucous membranes are moist.     Pharynx: Oropharynx is clear.  Eyes:     General: No scleral icterus.       Right eye: No discharge.        Left eye: No discharge.     Extraocular Movements: Extraocular movements intact.     Conjunctiva/sclera: Conjunctivae normal.     Pupils: Pupils are equal, round, and reactive to light.  Cardiovascular:     Rate and Rhythm: Normal rate and regular rhythm.     Pulses: Normal pulses.     Heart sounds: Normal heart sounds. No murmur heard.    No friction rub. No gallop.  Pulmonary:     Effort: Pulmonary effort is normal. No respiratory distress.     Breath sounds: Normal breath sounds. No stridor. No wheezing, rhonchi or rales.  Chest:     Chest wall: No tenderness.  Musculoskeletal:        General: Normal range of motion.     Cervical back: Normal range of motion and neck supple.  Skin:    General: Skin is warm and dry.     Capillary Refill: Capillary refill takes less than 2 seconds.     Coloration: Skin is not jaundiced or pale.     Findings: No bruising, erythema, lesion or  rash.  Neurological:     General: No focal deficit present.     Mental Status: She is alert and oriented to person, place, and time. Mental status is at baseline.  Psychiatric:        Mood and Affect: Mood normal.        Behavior: Behavior normal.        Thought Content: Thought content normal.        Judgment: Judgment normal.     Results for orders placed or performed in visit on 07/08/21  Microscopic Examination   Urine  Result Value Ref Range   WBC, UA 0-5 0 - 5 /hpf   RBC, Urine 3-10 (A) 0 - 2 /hpf   Epithelial Cells (non renal) 0-10 0 - 10 /hpf   Mucus, UA Present (A) Not Estab.   Bacteria, UA Few (A) None seen/Few  CBC with Differential/Platelet  Result Value Ref Range   WBC 4.9 3.4 - 10.8 x10E3/uL   RBC 5.21 3.77 - 5.28  x10E6/uL   Hemoglobin 14.5 11.1 - 15.9 g/dL   Hematocrit 44.6 34.0 - 46.6 %   MCV 86 79 - 97 fL   MCH 27.8 26.6 - 33.0 pg   MCHC 32.5 31.5 - 35.7 g/dL   RDW 13.6 11.7 - 15.4 %   Platelets 263 150 - 450 x10E3/uL   Neutrophils 50 Not Estab. %   Lymphs 32 Not Estab. %   Monocytes 11 Not Estab. %   Eos 6 Not Estab. %   Basos 1 Not Estab. %   Neutrophils Absolute 2.4 1.4 - 7.0 x10E3/uL   Lymphocytes Absolute 1.6 0.7 - 3.1 x10E3/uL   Monocytes Absolute 0.5 0.1 - 0.9 x10E3/uL   EOS (ABSOLUTE) 0.3 0.0 - 0.4 x10E3/uL   Basophils Absolute 0.1 0.0 - 0.2 x10E3/uL   Immature Granulocytes 0 Not Estab. %   Immature Grans (Abs) 0.0 0.0 - 0.1 x10E3/uL  Comprehensive metabolic panel  Result Value Ref Range   Glucose 86 70 - 99 mg/dL   BUN 13 8 - 27 mg/dL   Creatinine, Ser 1.24 (H) 0.57 - 1.00 mg/dL   eGFR 47 (L) >59 mL/min/1.73   BUN/Creatinine Ratio 10 (L) 12 - 28   Sodium 142 134 - 144 mmol/L   Potassium 4.6 3.5 - 5.2 mmol/L   Chloride 102 96 - 106 mmol/L   CO2 23 20 - 29 mmol/L   Calcium 9.2 8.7 - 10.3 mg/dL   Total Protein 6.7 6.0 - 8.5 g/dL   Albumin 4.4 3.8 - 4.8 g/dL   Globulin, Total 2.3 1.5 - 4.5 g/dL   Albumin/Globulin Ratio 1.9 1.2 - 2.2   Bilirubin Total 0.6 0.0 - 1.2 mg/dL   Alkaline Phosphatase 106 44 - 121 IU/L   AST 22 0 - 40 IU/L   ALT 15 0 - 32 IU/L  Lipid Panel w/o Chol/HDL Ratio  Result Value Ref Range   Cholesterol, Total 159 100 - 199 mg/dL   Triglycerides 71 0 - 149 mg/dL   HDL 49 >39 mg/dL   VLDL Cholesterol Cal 14 5 - 40 mg/dL   LDL Chol Calc (NIH) 96 0 - 99 mg/dL  Urinalysis, Routine w reflex microscopic  Result Value Ref Range   Specific Gravity, UA 1.025 1.005 - 1.030   pH, UA 5.5 5.0 - 7.5   Color, UA Yellow Yellow   Appearance Ur Clear Clear   Leukocytes,UA 1+ (A) Negative   Protein,UA Negative Negative/Trace   Glucose, UA Negative  Negative   Ketones, UA Negative Negative   RBC, UA 2+ (A) Negative   Bilirubin, UA Negative Negative   Urobilinogen,  Ur 0.2 0.2 - 1.0 mg/dL   Nitrite, UA Negative Negative   Microscopic Examination See below:   TSH  Result Value Ref Range   TSH 3.170 0.450 - 4.500 uIU/mL  Microalbumin, Urine Waived  Result Value Ref Range   Microalb, Ur Waived 10 0 - 19 mg/L   Creatinine, Urine Waived 300 10 - 300 mg/dL   Microalb/Creat Ratio <30 <30 mg/g  Cologuard  Result Value Ref Range   COLOGUARD Negative Negative      Assessment & Plan:   Problem List Items Addressed This Visit       Genitourinary   Vaginal atrophy    Under good control on current regimen. Continue current regimen. Continue to monitor. Call with any concerns. Refills given. Labs drawn today.       Benign hypertensive renal disease - Primary    Under good control on current regimen. Continue current regimen. Continue to monitor. Call with any concerns. Refills given. Labs drawn today.       Relevant Orders   Comprehensive metabolic panel   CBC with Differential/Platelet     Other   Hyperlipidemia    Under good control on current regimen. Continue current regimen. Continue to monitor. Call with any concerns. Refills given. Labs drawn today.       Relevant Medications   furosemide (LASIX) 20 MG tablet   lisinopril (ZESTRIL) 10 MG tablet   Other Relevant Orders   Comprehensive metabolic panel   CBC with Differential/Platelet   Lipid Panel w/o Chol/HDL Ratio     Follow up plan: Return in about 6 months (around 07/08/2022) for physical.

## 2022-01-05 NOTE — Assessment & Plan Note (Signed)
Under good control on current regimen. Continue current regimen. Continue to monitor. Call with any concerns. Refills given. Labs drawn today.   

## 2022-01-06 LAB — COMPREHENSIVE METABOLIC PANEL
ALT: 22 IU/L (ref 0–32)
AST: 17 IU/L (ref 0–40)
Albumin/Globulin Ratio: 1.8 (ref 1.2–2.2)
Albumin: 4.2 g/dL (ref 3.9–4.9)
Alkaline Phosphatase: 116 IU/L (ref 44–121)
BUN/Creatinine Ratio: 12 (ref 12–28)
BUN: 13 mg/dL (ref 8–27)
Bilirubin Total: 0.4 mg/dL (ref 0.0–1.2)
CO2: 23 mmol/L (ref 20–29)
Calcium: 9.4 mg/dL (ref 8.7–10.3)
Chloride: 101 mmol/L (ref 96–106)
Creatinine, Ser: 1.12 mg/dL — ABNORMAL HIGH (ref 0.57–1.00)
Globulin, Total: 2.4 g/dL (ref 1.5–4.5)
Glucose: 100 mg/dL — ABNORMAL HIGH (ref 70–99)
Potassium: 4.5 mmol/L (ref 3.5–5.2)
Sodium: 139 mmol/L (ref 134–144)
Total Protein: 6.6 g/dL (ref 6.0–8.5)
eGFR: 53 mL/min/{1.73_m2} — ABNORMAL LOW (ref >59)

## 2022-01-06 LAB — CBC WITH DIFFERENTIAL/PLATELET
Basophils Absolute: 0.1 10*3/uL (ref 0.0–0.2)
Basos: 1 %
EOS (ABSOLUTE): 0.2 10*3/uL (ref 0.0–0.4)
Eos: 4 %
Hematocrit: 45.8 % (ref 34.0–46.6)
Hemoglobin: 14.5 g/dL (ref 11.1–15.9)
Immature Grans (Abs): 0 10*3/uL (ref 0.0–0.1)
Immature Granulocytes: 0 %
Lymphocytes Absolute: 1.6 10*3/uL (ref 0.7–3.1)
Lymphs: 28 %
MCH: 27 pg (ref 26.6–33.0)
MCHC: 31.7 g/dL (ref 31.5–35.7)
MCV: 85 fL (ref 79–97)
Monocytes Absolute: 0.4 10*3/uL (ref 0.1–0.9)
Monocytes: 8 %
Neutrophils Absolute: 3.3 10*3/uL (ref 1.4–7.0)
Neutrophils: 59 %
Platelets: 254 10*3/uL (ref 150–450)
RBC: 5.37 x10E6/uL — ABNORMAL HIGH (ref 3.77–5.28)
RDW: 13.8 % (ref 11.7–15.4)
WBC: 5.6 10*3/uL (ref 3.4–10.8)

## 2022-01-06 LAB — LIPID PANEL W/O CHOL/HDL RATIO
Cholesterol, Total: 167 mg/dL (ref 100–199)
HDL: 52 mg/dL (ref >39)
LDL Chol Calc (NIH): 97 mg/dL (ref 0–99)
Triglycerides: 97 mg/dL (ref 0–149)
VLDL Cholesterol Cal: 18 mg/dL (ref 5–40)

## 2022-01-06 NOTE — Progress Notes (Signed)
Please let patient know that her lab work looks good.  Your kidney function remains stable.  Cholesterol and complete blood count are within normal limits.  Continue with current medication regimen.  Follow up as discussed.

## 2022-01-17 NOTE — Telephone Encounter (Signed)
No answer, no VM

## 2022-03-01 ENCOUNTER — Ambulatory Visit (INDEPENDENT_AMBULATORY_CARE_PROVIDER_SITE_OTHER): Payer: Medicare Other

## 2022-03-01 DIAGNOSIS — Z23 Encounter for immunization: Secondary | ICD-10-CM

## 2022-03-04 ENCOUNTER — Ambulatory Visit
Admission: RE | Admit: 2022-03-04 | Discharge: 2022-03-04 | Disposition: A | Discharge status: Home / Self Care | Payer: Medicare Other | Source: Ambulatory Visit | Attending: Family Medicine | Admitting: Family Medicine

## 2022-03-04 ENCOUNTER — Encounter: Payer: Self-pay | Admitting: Family Medicine

## 2022-03-04 DIAGNOSIS — M85832 Other specified disorders of bone density and structure, left forearm: Secondary | ICD-10-CM | POA: Diagnosis not present

## 2022-03-04 DIAGNOSIS — Z1231 Encounter for screening mammogram for malignant neoplasm of breast: Secondary | ICD-10-CM

## 2022-03-04 DIAGNOSIS — M810 Age-related osteoporosis without current pathological fracture: Secondary | ICD-10-CM | POA: Diagnosis not present

## 2022-03-04 DIAGNOSIS — Z780 Asymptomatic menopausal state: Secondary | ICD-10-CM | POA: Diagnosis not present

## 2022-03-15 ENCOUNTER — Other Ambulatory Visit: Payer: Self-pay | Admitting: Family Medicine

## 2022-03-15 NOTE — Telephone Encounter (Signed)
Unable to refill per protocol, last refill 01/15/22 for 90 and 1 rf.E-Prescribing Status: Receipt confirmed by pharmacy (01/05/2022 10:05 AM EDT). Will refuse request.  Requested Prescriptions  Pending Prescriptions Disp Refills  . furosemide (LASIX) 20 MG tablet [Pharmacy Med Name: Furosemide 20 MG Oral Tablet] 100 tablet 2    Sig: TAKE 1 TABLET BY MOUTH DAILY     Cardiovascular:  Diuretics - Loop Failed - 03/15/2022 10:17 AM      Failed - Cr in normal range and within 180 days    Creatinine, Ser  Date Value Ref Range Status  01/05/2022 1.12 (H) 0.57 - 1.00 mg/dL Final         Failed - Mg Level in normal range and within 180 days    Magnesium  Date Value Ref Range Status  03/02/2015 2.0 1.6 - 2.3 mg/dL Final         Passed - K in normal range and within 180 days    Potassium  Date Value Ref Range Status  01/05/2022 4.5 3.5 - 5.2 mmol/L Final         Passed - Ca in normal range and within 180 days    Calcium  Date Value Ref Range Status  01/05/2022 9.4 8.7 - 10.3 mg/dL Final         Passed - Na in normal range and within 180 days    Sodium  Date Value Ref Range Status  01/05/2022 139 134 - 144 mmol/L Final         Passed - Cl in normal range and within 180 days    Chloride  Date Value Ref Range Status  01/05/2022 101 96 - 106 mmol/L Final         Passed - Last BP in normal range    BP Readings from Last 1 Encounters:  01/05/22 119/78         Passed - Valid encounter within last 6 months    Recent Outpatient Visits          2 months ago Benign hypertensive renal disease   [facility] [place], Megan P, DO   8 months ago Routine general medical examination at a health care facility   [facility], Megan P, DO   11 months ago Hypertension, unspecified type   [place], Megan P, DO   1 year ago Hypertension, unspecified type   [place], Megan P, DO   1 year ago Hypertension,  unspecified type   [facility] [place], [place], MD      Future Appointments            In 3 months [place], Barb Merino, DO [place], [facility]           . lisinopril (ZESTRIL) 10 MG tablet [Pharmacy Med Name: Lisinopril 10 MG Oral Tablet] 100 tablet 2    Sig: TAKE 1 TABLET BY MOUTH DAILY     Cardiovascular:  ACE Inhibitors Failed - 03/15/2022 10:17 AM      Failed - Cr in normal range and within 180 days    Creatinine, Ser  Date Value Ref Range Status  01/05/2022 1.12 (H) 0.57 - 1.00 mg/dL Final         Passed - K in normal range and within 180 days    Potassium  Date Value Ref Range Status  01/05/2022 4.5 3.5 - 5.2 mmol/L Final         Passed - Patient  is not pregnant      Passed - Last BP in normal range    BP Readings from Last 1 Encounters:  01/05/22 119/78         Passed - Valid encounter within last 6 months    Recent Outpatient Visits          2 months ago Benign hypertensive renal disease   [facility] [place], Megan P, DO   8 months ago Routine general medical examination at a health care facility   [facility], [place] P, DO   11 months ago Hypertension, unspecified type   [place], Megan P, DO   1 year ago Hypertension, unspecified type   [place], [place], DO   1 year ago Hypertension, unspecified type   [place], Avanti, MD      Future Appointments            In 3 months [place], Barb Merino, DO [place], [facility]

## 2022-03-16 DIAGNOSIS — H2513 Age-related nuclear cataract, bilateral: Secondary | ICD-10-CM | POA: Diagnosis not present

## 2022-03-17 ENCOUNTER — Other Ambulatory Visit: Payer: Self-pay | Admitting: Family Medicine

## 2022-03-17 MED ORDER — ALENDRONATE SODIUM 70 MG PO TABS: 70.0000 mg | ORAL_TABLET | ORAL | 11 refills | Status: DC

## 2022-04-08 ENCOUNTER — Other Ambulatory Visit: Payer: Self-pay | Admitting: Family Medicine

## 2022-04-08 NOTE — Telephone Encounter (Signed)
Refilled 07/08/2021 #[id number] Requested Prescriptions  Pending Prescriptions Disp Refills   omeprazole (PRILOSEC) 20 MG capsule [Pharmacy Med Name: Omeprazole 20 MG Oral Capsule Delayed Release] 100 capsule 2    Sig: TAKE 1 CAPSULE BY MOUTH DAILY     Gastroenterology: Proton Pump Inhibitors Passed - 04/08/2022  6:01 AM      Passed - Valid encounter within last 12 months    Recent Outpatient Visits           3 months ago Benign hypertensive renal disease   [facility] [place], Megan P, DO   9 months ago Routine general medical examination at a health care facility   [facility], [place], DO   1 year ago Hypertension, unspecified type   [place], Megan P, DO   1 year ago Hypertension, unspecified type   [place], [place], DO   1 year ago Hypertension, unspecified type   [place]ace Vigg, Avanti, MD       Future Appointments             In 3 months Johnson, Barb Merino, DO [place], [facility]

## 2022-06-09 ENCOUNTER — Other Ambulatory Visit: Payer: Self-pay | Admitting: Family Medicine

## 2022-06-09 NOTE — Telephone Encounter (Signed)
Unable to refill per protocol, Rx request is too soon.   Requested Prescriptions  Pending Prescriptions Disp Refills   omeprazole (PRILOSEC) 20 MG capsule [Pharmacy Med Name: Omeprazole 20 MG Oral Capsule Delayed Release] 70 capsule 4    Sig: TAKE 1 CAPSULE BY MOUTH DAILY     Gastroenterology: Proton Pump Inhibitors Passed - 06/09/2022  7:04 AM      Passed - Valid encounter within last 12 months    Recent Outpatient Visits           5 months ago Benign hypertensive renal disease   [facility] [place], Megan P, DO   11 months ago Routine general medical examination at a health care facility   [facility], [place], DO   1 year ago Hypertension, unspecified type   [place], Megan P, DO   1 year ago Hypertension, unspecified type   [place], Megan P, DO   1 year ago Hypertension, unspecified type   [facility] Vigg, Avanti, MD       Future Appointments             In 1 month Johnson, Barb Merino, DO [employer], [facility]

## 2022-06-16 ENCOUNTER — Other Ambulatory Visit: Payer: Self-pay | Admitting: Family Medicine

## 2022-07-11 ENCOUNTER — Other Ambulatory Visit: Payer: Self-pay | Admitting: Family Medicine

## 2022-07-11 ENCOUNTER — Encounter: Payer: Medicare Other | Admitting: Family Medicine

## 2022-07-12 MED ORDER — LISINOPRIL 10 MG PO TABS: 10.0000 mg | ORAL_TABLET | Freq: Every day | ORAL | 1 refills | Status: DC

## 2022-07-12 NOTE — Telephone Encounter (Signed)
Pt called, asked pt if she wanted to reschedule physical that was for yesterday and canceled d/t office being closed. Advised pt that office is at new location for a few weeks. Pt is very upset because she didn't get a call to let her know office was closed until she went to office yesterday and seen note on door. Pt would like to wait until office is back at [place] location to schedule for physical. Pt is needing refill on Omeprazole and Lisinopril. Advised her that lisinopril was sent on 06/16/22 to [facility] and pt states she never received it. Advised her I would send it again for her and that we would reach out when office is back at old location. Pt verbalized understanding.

## 2022-07-12 NOTE — Telephone Encounter (Signed)
Resending lisinopril since pt never received.   Requested Prescriptions  Pending Prescriptions Disp Refills   omeprazole (PRILOSEC) 20 MG capsule [Pharmacy Med Name: Omeprazole 20 MG Oral Capsule Delayed Release] 70 capsule 4    Sig: TAKE 1 CAPSULE BY MOUTH DAILY     Gastroenterology: Proton Pump Inhibitors Passed - 07/12/2022 10:36 AM      Passed - Valid encounter within last 12 months    Recent Outpatient Visits           6 months ago Benign hypertensive renal disease   [place], [place], DO   1 year ago Routine general medical examination at a health care facility   [place], [place], DO   1 year ago Hypertension, unspecified type   [place], [place], DO   1 year ago Hypertension, unspecified type   [place], Megan P, DO   1 year ago Hypertension, unspecified type   [place] Vigg, Avanti, MD               lisinopril (ZESTRIL) 10 MG tablet 90 tablet 1    Sig: Take 1 tablet (10 mg total) by mouth daily.     Cardiovascular:  ACE Inhibitors Failed - 07/12/2022 10:36 AM      Failed - Cr in normal range and within 180 days    Creatinine, Ser  Date Value Ref Range Status  01/05/2022 1.12 (H) 0.57 - 1.00 mg/dL Final         Failed - K in normal range and within 180 days    Potassium  Date Value Ref Range Status  01/05/2022 4.5 3.5 - 5.2 mmol/L Final         Failed - Valid encounter within last 6 months    Recent Outpatient Visits           6 months ago Benign hypertensive renal disease   [place], Barb Merino, DO   1 year ago Routine general medical examination at a health care facility   [place], [place] P, DO   1 year ago Hypertension, unspecified type   [place], Megan P, DO   1 year ago  Hypertension, unspecified type   [place], Megan P, DO   1 year ago Hypertension, unspecified type   [place] Vigg, Avanti, MD              Passed - Patient is not pregnant      Passed - Last BP in normal range    BP Readings from Last 1 Encounters:  01/05/22 119/78

## 2022-08-03 ENCOUNTER — Telehealth: Payer: Self-pay | Admitting: Family Medicine

## 2022-08-03 NOTE — Telephone Encounter (Signed)
Contacted Keylly Jahnke to schedule their annual wellness visit. Appointment made for 08/29/2022.  Sherol Dade; Care Guide Ambulatory [facility] Direct Dial: [phone]

## 2022-08-29 ENCOUNTER — Ambulatory Visit: Payer: Medicare Other

## 2022-08-29 ENCOUNTER — Encounter: Payer: Self-pay | Admitting: Family Medicine

## 2022-08-29 ENCOUNTER — Ambulatory Visit (INDEPENDENT_AMBULATORY_CARE_PROVIDER_SITE_OTHER): Payer: Medicare Other | Admitting: Family Medicine

## 2022-08-29 VITALS — BP 130/82 | HR 75 | Temp 97.8°F | Ht 61.6 in | Wt 159.8 lb

## 2022-08-29 VITALS — BP 130/82 | Ht 62.0 in | Wt 159.0 lb

## 2022-08-29 DIAGNOSIS — M816 Localized osteoporosis [Lequesne]: Secondary | ICD-10-CM | POA: Diagnosis not present

## 2022-08-29 DIAGNOSIS — N183 Chronic kidney disease, stage 3 unspecified: Secondary | ICD-10-CM | POA: Diagnosis not present

## 2022-08-29 DIAGNOSIS — I129 Hypertensive chronic kidney disease with stage 1 through stage 4 chronic kidney disease, or unspecified chronic kidney disease: Secondary | ICD-10-CM | POA: Diagnosis not present

## 2022-08-29 DIAGNOSIS — L900 Lichen sclerosus et atrophicus: Secondary | ICD-10-CM

## 2022-08-29 DIAGNOSIS — I700 Atherosclerosis of aorta: Secondary | ICD-10-CM | POA: Diagnosis not present

## 2022-08-29 DIAGNOSIS — J449 Chronic obstructive pulmonary disease, unspecified: Secondary | ICD-10-CM | POA: Diagnosis not present

## 2022-08-29 DIAGNOSIS — Z0000 Encounter for general adult medical examination without abnormal findings: Secondary | ICD-10-CM | POA: Diagnosis not present

## 2022-08-29 DIAGNOSIS — I2510 Atherosclerotic heart disease of native coronary artery without angina pectoris: Secondary | ICD-10-CM | POA: Diagnosis not present

## 2022-08-29 DIAGNOSIS — E782 Mixed hyperlipidemia: Secondary | ICD-10-CM

## 2022-08-29 DIAGNOSIS — Z7189 Other specified counseling: Secondary | ICD-10-CM | POA: Diagnosis not present

## 2022-08-29 DIAGNOSIS — D692 Other nonthrombocytopenic purpura: Secondary | ICD-10-CM | POA: Diagnosis not present

## 2022-08-29 LAB — MICROALBUMIN, URINE WAIVED
Creatinine, Urine Waived: 100 mg/dL (ref 10–300)
Microalb, Ur Waived: 30 mg/L — ABNORMAL HIGH (ref 0–19)
Microalb/Creat Ratio: <30 mg/g (ref <30)

## 2022-08-29 LAB — MICROSCOPIC EXAMINATION: Bacteria, UA: NONE SEEN

## 2022-08-29 LAB — URINALYSIS, ROUTINE W REFLEX MICROSCOPIC
Bilirubin, UA: NEGATIVE
Glucose, UA: NEGATIVE
Ketones, UA: NEGATIVE
Nitrite, UA: NEGATIVE
Protein,UA: NEGATIVE
Specific Gravity, UA: 1.02 (ref 1.005–1.030)
Urobilinogen, Ur: 0.2 mg/dL (ref 0.2–1.0)
pH, UA: 5 (ref 5.0–7.5)

## 2022-08-29 MED ORDER — CLOBETASOL PROPIONATE 0.05 % EX CREA: 1.0000 | TOPICAL_CREAM | CUTANEOUS | 1 refills | Status: DC

## 2022-08-29 MED ORDER — ATORVASTATIN CALCIUM 20 MG PO TABS: 20.0000 mg | ORAL_TABLET | Freq: Every day | ORAL | 0 refills | Status: DC

## 2022-08-29 NOTE — Assessment & Plan Note (Signed)
Under good control on current regimen. Continue current regimen. Continue to monitor. Call with any concerns. Refills given. Labs drawn today.   

## 2022-08-29 NOTE — Assessment & Plan Note (Signed)
Reassured patient. Continue to monitor.  

## 2022-08-29 NOTE — Assessment & Plan Note (Signed)
A voluntary discussion about advance care planning including the explanation and discussion of advance directives was extensively discussed  with the patient for 5 minutes with patient present.  Explanation about the health care proxy and Living will was reviewed and packet with forms with explanation of how to fill them out was given.  During this discussion, the patient was able to identify a health care proxy as her husband and  is unsure if she plans to fill out the paperwork required.  Patient was offered a separate [place] visit for further assistance with forms.

## 2022-08-29 NOTE — Patient Instructions (Signed)
Preventative Services:  Health Risk Assessment and Personalized Prevention Plan: Done today Bone Mass Measurements: Up to date Breast Cancer Screening: up to date CVD Screening: Done today Cervical Cancer Screening: N/A Colon Cancer Screening: up to date Depression Screening: Done today Diabetes Screening: Done today Glaucoma Screening: See your eye doctor Hepatitis B vaccine: N/A Hepatitis C screening: Up to date HIV Screening: up to date Flu Vaccine: up to date Lung cancer Screening: N/A Obesity Screening: Done today Pneumonia Vaccines (2): up to date STI Screening: N/A

## 2022-08-29 NOTE — Assessment & Plan Note (Signed)
Continue clobetasol. Call with any concerns.

## 2022-08-29 NOTE — Assessment & Plan Note (Signed)
Doing well not on medicine. Continue to monitor. Call with any concerns.  

## 2022-08-29 NOTE — Assessment & Plan Note (Signed)
Will keep her BP and cholesterol under good control. Continue to monitor. Call with any concerns.  

## 2022-08-29 NOTE — Patient Instructions (Signed)
Jasmine Church , Thank you for taking time to come for your Medicare Wellness Visit. I appreciate your ongoing commitment to your health goals. Please review the following plan we discussed and let me know if I can assist you in the future.   These are the goals we discussed:  Goals      DIET - EAT MORE FRUITS AND VEGETABLES     Patient Stated     08/07/2020, no goals     Patient Stated     Continue current lifestyle        This is a list of the screening recommended for you and due dates:  Health Maintenance  Topic Date Due   COVID-19 Vaccine (1) Never done   Zoster (Shingles) Vaccine (1 of 2) Never done   Medicare Annual Wellness Visit  08/29/2023   Mammogram  03/04/2024   Cologuard (Stool DNA test)  07/21/2024   DEXA scan (bone density measurement)  03/04/2025   DTaP/Tdap/Td vaccine (2 - Td or Tdap) 03/02/2026   Pneumonia Vaccine  Completed   Flu Shot  Completed   Hepatitis C Screening: USPSTF Recommendation to screen - Ages 32-79 yo.  Completed   HPV Vaccine  Aged Out    Advanced directives: no  Conditions/risks identified: none  Next appointment: Follow up in one year for your annual wellness visit 09/04/23 @ 9:45 am by phone   Preventive Care 65 Years and Older, Female Preventive care refers to lifestyle choices and visits with your health care provider that can promote health and wellness. What does preventive care include? A yearly physical exam. This is also called an annual well check. Dental exams once or twice a year. Routine eye exams. Ask your health care provider how often you should have your eyes checked. Personal lifestyle choices, including: Daily care of your teeth and gums. Regular physical activity. Eating a healthy diet. Avoiding tobacco and drug use. Limiting alcohol use. Practicing safe sex. Taking low-dose aspirin every day. Taking vitamin and mineral supplements as recommended by your health care provider. What happens during an annual well  check? The services and screenings done by your health care provider during your annual well check will depend on your age, overall health, lifestyle risk factors, and family history of disease. Counseling  Your health care provider may ask you questions about your: Alcohol use. Tobacco use. Drug use. Emotional well-being. Home and relationship well-being. Sexual activity. Eating habits. History of falls. Memory and ability to understand (cognition). Work and work Statistician. Reproductive health. Screening  You may have the following tests or measurements: Height, weight, and BMI. Blood pressure. Lipid and cholesterol levels. These may be checked every 5 years, or more frequently if you are over 32 years old. Skin check. Lung cancer screening. You may have this screening every year starting at age 21 if you have a 30-pack-year history of smoking and currently smoke or have quit within the past 15 years. Fecal occult blood test (FOBT) of the stool. You may have this test every year starting at age 21. Flexible sigmoidoscopy or colonoscopy. You may have a sigmoidoscopy every 5 years or a colonoscopy every 10 years starting at age 55. Hepatitis C blood test. Hepatitis B blood test. Sexually transmitted disease (STD) testing. Diabetes screening. This is done by checking your blood sugar (glucose) after you have not eaten for a while (fasting). You may have this done every 1-3 years. Bone density scan. This is done to screen for osteoporosis. You may have  this done starting at age 80. Mammogram. This may be done every 1-2 years. Talk to your health care provider about how often you should have regular mammograms. Talk with your health care provider about your test results, treatment options, and if necessary, the need for more tests. Vaccines  Your health care provider may recommend certain vaccines, such as: Influenza vaccine. This is recommended every year. Tetanus, diphtheria, and  acellular pertussis (Tdap, Td) vaccine. You may need a Td booster every 10 years. Zoster vaccine. You may need this after age 70. Pneumococcal 13-valent conjugate (PCV13) vaccine. One dose is recommended after age 65. Pneumococcal polysaccharide (PPSV23) vaccine. One dose is recommended after age 72. Talk to your health care provider about which screenings and vaccines you need and how often you need them. This information is not intended to replace advice given to you by your health care provider. Make sure you discuss any questions you have with your health care provider. Document Released: 06/19/2015 Document Revised: 02/10/2016 Document Reviewed: 03/24/2015 Elsevier Interactive Patient Education  2017 [employer] Prevention in the Home Falls can cause injuries. They can happen to people of all ages. There are many things you can do to make your home safe and to help prevent falls. What can I do on the outside of my home? Regularly fix the edges of walkways and driveways and fix any cracks. Remove anything that might make you trip as you walk through a door, such as a raised step or threshold. Trim any bushes or trees on the path to your home. Use bright outdoor lighting. Clear any walking paths of anything that might make someone trip, such as rocks or tools. Regularly check to see if handrails are loose or broken. Make sure that both sides of any steps have handrails. Any raised decks and porches should have guardrails on the edges. Have any leaves, snow, or ice cleared regularly. Use sand or salt on walking paths during winter. Clean up any spills in your garage right away. This includes oil or grease spills. What can I do in the bathroom? Use night lights. Install grab bars by the toilet and in the tub and shower. Do not use towel bars as grab bars. Use non-skid mats or decals in the tub or shower. If you need to sit down in the shower, use a plastic, non-slip stool. Keep  the floor dry. Clean up any water that spills on the floor as soon as it happens. Remove soap buildup in the tub or shower regularly. Attach bath mats securely with double-sided non-slip rug tape. Do not have throw rugs and other things on the floor that can make you trip. What can I do in the bedroom? Use night lights. Make sure that you have a light by your bed that is easy to reach. Do not use any sheets or blankets that are too big for your bed. They should not hang down onto the floor. Have a firm chair that has side arms. You can use this for support while you get dressed. Do not have throw rugs and other things on the floor that can make you trip. What can I do in the kitchen? Clean up any spills right away. Avoid walking on wet floors. Keep items that you use a lot in easy-to-reach places. If you need to reach something above you, use a strong step stool that has a grab bar. Keep electrical cords out of the way. Do not use floor polish or  wax that makes floors slippery. If you must use wax, use non-skid floor wax. Do not have throw rugs and other things on the floor that can make you trip. What can I do with my stairs? Do not leave any items on the stairs. Make sure that there are handrails on both sides of the stairs and use them. Fix handrails that are broken or loose. Make sure that handrails are as long as the stairways. Check any carpeting to make sure that it is firmly attached to the stairs. Fix any carpet that is loose or worn. Avoid having throw rugs at the top or bottom of the stairs. If you do have throw rugs, attach them to the floor with carpet tape. Make sure that you have a light switch at the top of the stairs and the bottom of the stairs. If you do not have them, ask someone to add them for you. What else can I do to help prevent falls? Wear shoes that: Do not have high heels. Have rubber bottoms. Are comfortable and fit you well. Are closed at the toe. Do not  wear sandals. If you use a stepladder: Make sure that it is fully opened. Do not climb a closed stepladder. Make sure that both sides of the stepladder are locked into place. Ask someone to hold it for you, if possible. Clearly mark and make sure that you can see: Any grab bars or handrails. First and last steps. Where the edge of each step is. Use tools that help you move around (mobility aids) if they are needed. These include: Canes. Walkers. Scooters. Crutches. Turn on the lights when you go into a dark area. Replace any light bulbs as soon as they burn out. Set up your furniture so you have a clear path. Avoid moving your furniture around. If any of your floors are uneven, fix them. If there are any pets around you, be aware of where they are. Review your medicines with your doctor. Some medicines can make you feel dizzy. This can increase your chance of falling. Ask your doctor what other things that you can do to help prevent falls. This information is not intended to replace advice given to you by your health care provider. Make sure you discuss any questions you have with your health care provider. Document Released: 03/19/2009 Document Revised: 10/29/2015 Document Reviewed: 06/27/2014 Elsevier Interactive Patient Education  2017 [employer].

## 2022-08-29 NOTE — Progress Notes (Signed)
BP 130/82   Pulse 75   Temp 97.8 F (36.6 C) (Oral)   Ht 5' 1.6" (1.565 m)   Wt 159 lb 12.8 oz (72.5 kg)   SpO2 97%   BMI 29.61 kg/m    Subjective:    Patient ID: Jasmine Church, female    DOB: 02-21-1951, 72 y.o.   MRN: [id number]  HPI: Jasmine Church is a 72 y.o. female presenting on 08/29/2022 for comprehensive medical examination. Current medical complaints include:  HYPERTENSION / HYPERLIPIDEMIA Satisfied with current treatment? yes Duration of hypertension: chronic BP monitoring frequency: not checking BP medication side effects: no Past BP meds: lasix, lisinopril Duration of hyperlipidemia: chronic Cholesterol medication side effects: no Cholesterol supplements: none Past cholesterol medications:  Medication compliance: excellent compliance Aspirin: yes Recent stressors: no Recurrent headaches: no Visual changes: no Palpitations: no Dyspnea: no Chest pain: no Lower extremity edema: no Dizzy/lightheaded: no  COPD COPD status: stable Satisfied with current treatment?: yes Oxygen use: no Dyspnea frequency: rarely Cough frequency: rarely Rescue inhaler frequency: never   Limitation of activity: no Productive cough: no Pneumovax: Up to Date Influenza: Up to Date  She currently lives with: husband Menopausal Symptoms: no  Functional Status Survey: Is the patient deaf or have difficulty hearing?: No Does the patient have difficulty seeing, even when wearing glasses/contacts?: No Does the patient have difficulty concentrating, remembering, or making decisions?: No Does the patient have difficulty walking or climbing stairs?: No Does the patient have difficulty dressing or bathing?: No Does the patient have difficulty doing errands alone such as visiting a doctor's office or shopping?: No     08/29/2022    9:00 AM 08/09/2021   12:21 PM 08/09/2021   12:06 PM 07/08/2021    8:43 AM 02/03/2021    9:49 AM  [place] in the past year? 0 0 0 0 0   Number falls in past yr: 0 0 0 0 0  Injury with Fall? 0 0 0 0 0  Risk for fall due to : No Fall Risks   No Fall Risks No Fall Risks  Follow up Falls evaluation completed Falls evaluation completed Falls evaluation completed;Falls prevention discussed;Education provided Falls evaluation completed Falls evaluation completed    Depression Screen    08/29/2022    9:49 AM 08/29/2022    9:00 AM 01/05/2022    9:53 AM 08/09/2021   12:20 PM 07/08/2021    8:44 AM  Depression screen PHQ 2/9  Decreased Interest 0 0 0 0 0  Down, Depressed, Hopeless 0 0 0 0 0  PHQ - 2 Score 0 0 0 0 0  Altered sleeping 0 0 0 0 0  Tired, decreased energy 0 0 0 0 0  Change in appetite 0 0 0 0 0  Feeling bad or failure about yourself  0 0 0 0 0  Trouble concentrating 0 0 0 0 0  Moving slowly or fidgety/restless 0 0 0 0 0  Suicidal thoughts 0 0 0 0 0  PHQ-9 Score 0 0 0 0 0  Difficult doing work/chores Not difficult at all Not difficult at all Not difficult at all      Advanced Directives Does patient have a HCPOA?    no Does patient have a living will or MOST form?  no  Past Medical History:  Past Medical History:  Diagnosis Date   Arthritis    CKD (chronic kidney disease) stage 3, GFR 30-59 ml/min (HCC)    Hyperlipidemia  Hypertension    Vaginal atrophy     Surgical History:  Past Surgical History:  Procedure Laterality Date   HEMORROIDECTOMY     HERNIA REPAIR      Medications:  Current Outpatient Medications on File Prior to Visit  Medication Sig   alendronate (FOSAMAX) 70 MG tablet Take 1 tablet (70 mg total) by mouth every 7 (seven) days. Take with a full glass of water on an empty stomach.   aspirin EC 81 MG tablet Take 81 mg by mouth daily.   Calcium Carbonate-Vitamin D 500-125 MG-UNIT TABS Take by mouth daily. Patient takes it only 3 days a week.   furosemide (LASIX) 20 MG tablet Take 1 tablet (20 mg total) by mouth daily.   lisinopril (ZESTRIL) 10 MG tablet Take 1 tablet (10 mg total) by  mouth daily.   omeprazole (PRILOSEC) 20 MG capsule TAKE 1 CAPSULE BY MOUTH DAILY   polyethylene glycol powder (GLYCOLAX/MIRALAX) powder Take 17 g by mouth 3 (three) times daily as needed.   No current facility-administered medications on file prior to visit.    Allergies:  No Known Allergies  Social History:  Social History   Socioeconomic History   Marital status: Married    Spouse name: Not on file   Number of children: Not on file   Years of education: Not on file   Highest education level: Not on file  Occupational History   Not on file  Tobacco Use   Smoking status: Former    Packs/day: 1.00    Years: 33.00    Additional pack years: 0.00    Total pack years: 33.00    Types: Cigarettes    Quit date: 06/06/2004    Years since quitting: 18.2   Smokeless tobacco: Never  Vaping Use   Vaping Use: Never used  Substance and Sexual Activity   Alcohol use: No   Drug use: No   Sexual activity: Never  Other Topics Concern   Not on file  Social History Narrative   Not on file   Social Determinants of Health   Financial Resource Strain: Low Risk  (08/29/2022)   Overall Financial Resource Strain (CARDIA)    Difficulty of Paying Living Expenses: Not hard at all  Food Insecurity: No Food Insecurity (08/29/2022)   Hunger Vital Sign    Worried About Running Out of Food in the Last Year: Never true    [place] in the Last Year: Never true  Transportation Needs: No Transportation Needs (08/29/2022)   PRAPARE - Hydrologist (Medical): No    Lack of Transportation (Non-Medical): No  Physical Activity: Sufficiently Active (08/29/2022)   Exercise Vital Sign    Days of Exercise per Week: 7 days    Minutes of Exercise per Session: 30 min  Stress: No Stress Concern Present (08/29/2022)   [place]    Feeling of Stress : Not at all  Social Connections: Moderately Isolated (08/29/2022)    Social Connection and Isolation Panel [NHANES]    Frequency of Communication with Friends and Family: Once a week    Frequency of Social Gatherings with Friends and Family: Once a week    Attends Religious Services: More than 4 times per year    Active Member of [employer] or Organizations: No    Attends Archivist Meetings: Never    Marital Status: Married  Human resources officer Violence: Not At Risk (08/29/2022)  Humiliation, Afraid, Rape, and Kick questionnaire    Fear of Current or Ex-Partner: No    Emotionally Abused: No    Physically Abused: No    Sexually Abused: No   Social History   Tobacco Use  Smoking Status Former   Packs/day: 1.00   Years: 33.00   Additional pack years: 0.00   Total pack years: 33.00   Types: Cigarettes   Quit date: 06/06/2004   Years since quitting: 18.2  Smokeless Tobacco Never   Social History   Substance and Sexual Activity  Alcohol Use No    Family History:  Family History  Problem Relation Age of Onset   Arthritis Mother    Hyperlipidemia Mother    Hypertension Mother    Heart disease Father    Hypertension Father    Heart attack Father    Cancer Sister        uterus   Hypertension Sister    Heart disease Brother    Heart attack Brother    Hypertension Brother    Stroke Maternal Grandmother    Heart attack Brother    Hypertension Brother    Diabetes Neg Hx    Breast cancer Neg Hx     Past medical history, surgical history, medications, allergies, family history and social history reviewed with patient today and changes made to appropriate areas of the chart.   Review of Systems  Constitutional: Negative.   HENT: Negative.    Eyes: Negative.   Respiratory: Negative.    Cardiovascular: Negative.   Gastrointestinal:  Positive for diarrhea. Negative for abdominal pain, blood in stool, constipation, heartburn, melena, nausea and vomiting.  Genitourinary:  Positive for frequency. Negative for dysuria, flank pain, hematuria  and urgency.  Musculoskeletal:  Positive for myalgias. Negative for back pain, falls, joint pain and neck pain.  Skin: Negative.   Neurological: Negative.   Endo/Heme/Allergies:  Negative for environmental allergies and polydipsia. Bruises/bleeds easily.  Psychiatric/Behavioral: Negative.      All other ROS negative except what is listed above and in the HPI.      Objective:    BP 130/82   Pulse 75   Temp 97.8 F (36.6 C) (Oral)   Ht 5' 1.6" (1.565 m)   Wt 159 lb 12.8 oz (72.5 kg)   SpO2 97%   BMI 29.61 kg/m   Wt Readings from Last 3 Encounters:  08/29/22 159 lb (72.1 kg)  08/29/22 159 lb 12.8 oz (72.5 kg)  01/05/22 157 lb 12.8 oz (71.6 kg)     Physical Exam Vitals and nursing note reviewed.  Constitutional:      General: She is not in acute distress.    Appearance: Normal appearance. She is normal weight. She is not ill-appearing, toxic-appearing or diaphoretic.  HENT:     Head: Normocephalic and atraumatic.     Right Ear: Tympanic membrane, ear canal and external ear normal. There is no impacted cerumen.     Left Ear: Tympanic membrane, ear canal and external ear normal. There is no impacted cerumen.     Nose: Nose normal. No congestion or rhinorrhea.     Mouth/Throat:     Mouth: Mucous membranes are moist.     Pharynx: Oropharynx is clear. No oropharyngeal exudate or posterior oropharyngeal erythema.  Eyes:     General: No scleral icterus.       Right eye: No discharge.        Left eye: No discharge.     Extraocular Movements: Extraocular movements  intact.     Conjunctiva/sclera: Conjunctivae normal.     Pupils: Pupils are equal, round, and reactive to light.  Neck:     Vascular: No carotid bruit.  Cardiovascular:     Rate and Rhythm: Normal rate and regular rhythm.     Pulses: Normal pulses.     Heart sounds: No murmur heard.    No friction rub. No gallop.  Pulmonary:     Effort: Pulmonary effort is normal. No respiratory distress.     Breath sounds:  Normal breath sounds. No stridor. No wheezing, rhonchi or rales.  Chest:     Chest wall: No tenderness.  Abdominal:     General: Abdomen is flat. Bowel sounds are normal. There is no distension.     Palpations: Abdomen is soft. There is no mass.     Tenderness: There is no abdominal tenderness. There is no right CVA tenderness, left CVA tenderness, guarding or rebound.     Hernia: No hernia is present.  Genitourinary:    Comments: Breast and pelvic exams deferred with shared decision making Musculoskeletal:        General: No swelling, tenderness, deformity or signs of injury.     Cervical back: Normal range of motion and neck supple. No rigidity. No muscular tenderness.     Right lower leg: No edema.     Left lower leg: No edema.  Lymphadenopathy:     Cervical: No cervical adenopathy.  Skin:    General: Skin is warm and dry.     Capillary Refill: Capillary refill takes less than 2 seconds.     Coloration: Skin is not jaundiced or pale.     Findings: No bruising, erythema, lesion or rash.  Neurological:     General: No focal deficit present.     Mental Status: She is alert and oriented to person, place, and time. Mental status is at baseline.     Cranial Nerves: No cranial nerve deficit.     Sensory: No sensory deficit.     Motor: No weakness.     Coordination: Coordination normal.     Gait: Gait normal.     Deep Tendon Reflexes: Reflexes normal.  Psychiatric:        Mood and Affect: Mood normal.        Behavior: Behavior normal.        Thought Content: Thought content normal.        Judgment: Judgment normal.        08/29/2022    9:16 AM 08/07/2020    2:32 PM 04/23/2019    9:56 AM 04/12/2018    8:29 AM  6CIT Screen  What Year? 4 points 0 points 0 points 0 points  What month? 0 points 0 points 0 points 0 points  What time? 0 points 0 points 0 points 0 points  Count back from 20 0 points 0 points 0 points 0 points  Months in reverse 0 points 0 points 0 points 0 points   Repeat phrase 0 points 4 points 0 points 2 points  Total Score 4 points 4 points 0 points 2 points    Results for orders placed or performed in visit on 01/05/22  Comprehensive metabolic panel  Result Value Ref Range   Glucose 100 (H) 70 - 99 mg/dL   BUN 13 8 - 27 mg/dL   Creatinine, Ser 1.12 (H) 0.57 - 1.00 mg/dL   eGFR 53 (L) >59 mL/min/1.73   BUN/Creatinine Ratio 12 12 - 28  Sodium 139 134 - 144 mmol/L   Potassium 4.5 3.5 - 5.2 mmol/L   Chloride 101 96 - 106 mmol/L   CO2 23 20 - 29 mmol/L   Calcium 9.4 8.7 - 10.3 mg/dL   Total Protein 6.6 6.0 - 8.5 g/dL   Albumin 4.2 3.9 - 4.9 g/dL   Globulin, Total 2.4 1.5 - 4.5 g/dL   Albumin/Globulin Ratio 1.8 1.2 - 2.2   Bilirubin Total 0.4 0.0 - 1.2 mg/dL   Alkaline Phosphatase 116 44 - 121 IU/L   AST 17 0 - 40 IU/L   ALT 22 0 - 32 IU/L  CBC with Differential/Platelet  Result Value Ref Range   WBC 5.6 3.4 - 10.8 x10E3/uL   RBC 5.37 (H) 3.77 - 5.28 x10E6/uL   Hemoglobin 14.5 11.1 - 15.9 g/dL   Hematocrit 45.8 34.0 - 46.6 %   MCV 85 79 - 97 fL   MCH 27.0 26.6 - 33.0 pg   MCHC 31.7 31.5 - 35.7 g/dL   RDW 13.8 11.7 - 15.4 %   Platelets 254 150 - 450 x10E3/uL   Neutrophils 59 Not Estab. %   Lymphs 28 Not Estab. %   Monocytes 8 Not Estab. %   Eos 4 Not Estab. %   Basos 1 Not Estab. %   Neutrophils Absolute 3.3 1.4 - 7.0 x10E3/uL   Lymphocytes Absolute 1.6 0.7 - 3.1 x10E3/uL   Monocytes Absolute 0.4 0.1 - 0.9 x10E3/uL   EOS (ABSOLUTE) 0.2 0.0 - 0.4 x10E3/uL   Basophils Absolute 0.1 0.0 - 0.2 x10E3/uL   Immature Granulocytes 0 Not Estab. %   Immature Grans (Abs) 0.0 0.0 - 0.1 x10E3/uL  Lipid Panel w/o Chol/HDL Ratio  Result Value Ref Range   Cholesterol, Total 167 100 - 199 mg/dL   Triglycerides 97 0 - 149 mg/dL   HDL 52 >39 mg/dL   VLDL Cholesterol Cal 18 5 - 40 mg/dL   LDL Chol Calc (NIH) 97 0 - 99 mg/dL      Assessment & Plan:   Problem List Items Addressed This Visit       Cardiovascular and Mediastinum   CAD  (coronary artery disease)    Will keep her BP and cholesterol under good control. Continue to monitor. Call with any concerns.       Relevant Medications   atorvastatin (LIPITOR) 20 MG tablet   Aortic atherosclerosis ([place])    Will keep her BP and cholesterol under good control. Continue to monitor. Call with any concerns.       Relevant Medications   atorvastatin (LIPITOR) 20 MG tablet   Other Relevant Orders   CBC with Differential/Platelet   Comprehensive metabolic panel   Lipid Panel w/o Chol/HDL Ratio   Senile purpura ([place])    Reassured patient. Continue to monitor.       Relevant Medications   atorvastatin (LIPITOR) 20 MG tablet   Other Relevant Orders   CBC with Differential/Platelet   Comprehensive metabolic panel   Urinalysis, Routine w reflex microscopic     Respiratory   COPD (chronic obstructive pulmonary disease) ([place])    Doing well not on medicine. Continue to monitor. Call with any concerns.       Relevant Orders   CBC with Differential/Platelet   Comprehensive metabolic panel     Musculoskeletal and Integument   Osteoporosis    Under good control on current regimen. Continue current regimen. Continue to monitor. Call with any concerns. Refills up to date. Continue to monitor.  Genitourinary   Benign hypertensive renal disease    Under good control on current regimen. Continue current regimen. Continue to monitor. Call with any concerns. Refills given. Labs drawn today.        Relevant Orders   CBC with Differential/Platelet   Comprehensive metabolic panel   TSH   Microalbumin, Urine Waived   CKD (chronic kidney disease) stage 3, GFR 30-59 ml/min (HCC)    Rechecking labs today. Await results. Treat as needed.         Other   Hyperlipidemia    Under good control on current regimen. Continue current regimen. Continue to monitor. Call with any concerns. Refills given. Labs drawn today.       Relevant Medications   atorvastatin  (LIPITOR) 20 MG tablet   Other Relevant Orders   CBC with Differential/Platelet   Comprehensive metabolic panel   Lipid Panel w/o Chol/HDL Ratio   Lichen sclerosus et atrophicus    Continue clobetasol. Call with any concerns.       Advance directive discussed with patient    A voluntary discussion about advance care planning including the explanation and discussion of advance directives was extensively discussed  with the patient for 5 minutes with patient present.  Explanation about the health care proxy and Living will was reviewed and packet with forms with explanation of how to fill them out was given.  During this discussion, the patient was able to identify a health care proxy as her husband and  is unsure if she plans to fill out the paperwork required.  Patient was offered a separate [place] visit for further assistance with forms.         Other Visit Diagnoses     Encounter for Medicare annual wellness exam    -  Primary   Preventative care discussed today.   Relevant Orders   CBC with Differential/Platelet   Comprehensive metabolic panel   Routine general medical examination at a health care facility       Vaccines up to date. Screening labs checked today. Mammo, DEXA and cologuard up to date .Continue diet and exercise. Call with any concerns.   Relevant Orders   CBC with Differential/Platelet   Comprehensive metabolic panel        Preventative Services:  Health Risk Assessment and Personalized Prevention Plan: Done today Bone Mass Measurements: Up to date Breast Cancer Screening: up to date CVD Screening: Done today Cervical Cancer Screening: N/A Colon Cancer Screening: up to date Depression Screening: Done today Diabetes Screening: Done today Glaucoma Screening: See your eye doctor Hepatitis B vaccine: N/A Hepatitis C screening: Up to date HIV Screening: up to date Flu Vaccine: up to date Lung cancer Screening: N/A Obesity Screening: Done  today Pneumonia Vaccines (2): up to date STI Screening: N/A  Follow up plan: Return in about 6 months (around 03/01/2023).   LABORATORY TESTING:  - Pap smear: not applicable  IMMUNIZATIONS:   - Tdap: Tetanus vaccination status reviewed: last tetanus booster within 10 years. - Influenza: Up to date - Pneumovax: Up to date - Prevnar: Up to date - Zostavax vaccine: Refused  SCREENING: -Mammogram: Up to date  - Colonoscopy: Up to date  - Bone Density: Up to date   PATIENT COUNSELING:   Advised to take 1 mg of folate supplement per day if capable of pregnancy.   Sexuality: Discussed sexually transmitted diseases, partner selection, use of condoms, avoidance of unintended pregnancy  and contraceptive alternatives.   Advised to  avoid cigarette smoking.  I discussed with the patient that most people either abstain from alcohol or drink within safe limits (<=14/week and <=4 drinks/occasion for males, <=7/weeks and <= 3 drinks/occasion for females) and that the risk for alcohol disorders and other health effects rises proportionally with the number of drinks per week and how often a drinker exceeds daily limits.  Discussed cessation/primary prevention of drug use and availability of treatment for abuse.   Diet: Encouraged to adjust caloric intake to maintain  or achieve ideal body weight, to reduce intake of dietary saturated fat and total fat, to limit sodium intake by avoiding high sodium foods and not adding table salt, and to maintain adequate dietary potassium and calcium preferably from fresh fruits, vegetables, and low-fat dairy products.    stressed the importance of regular exercise  Injury prevention: Discussed safety belts, safety helmets, smoke detector, smoking near bedding or upholstery.   Dental health: Discussed importance of regular tooth brushing, flossing, and dental visits.    NEXT PREVENTATIVE PHYSICAL DUE IN 1 YEAR. Return in about 6 months (around  03/01/2023).

## 2022-08-29 NOTE — Progress Notes (Signed)
Subjective:   Jasmine Church is a 72 y.o. female who presents for Medicare Annual (Subsequent) preventive examination.  Review of Systems     Cardiac Risk Factors include: advanced age (>65men, >54 women);hypertension     Objective:    Today's Vitals   08/29/22 0947  BP: 130/82  Weight: 159 lb (72.1 kg)  Height: 5\' 2"  (1.575 m)   Body mass index is 29.08 kg/m.     08/29/2022    9:51 AM 08/09/2021   12:21 PM 08/07/2020    2:29 PM 08/05/2019   11:24 AM  Advanced Directives  Does Patient Have a Medical Advance Directive? No No No No  Would patient like information on creating a medical advance directive? No - Patient declined No - Patient declined      Current Medications (verified) Outpatient Encounter Medications as of 08/29/2022  Medication Sig   alendronate (FOSAMAX) 70 MG tablet Take 1 tablet (70 mg total) by mouth every 7 (seven) days. Take with a full glass of water on an empty stomach.   aspirin EC 81 MG tablet Take 81 mg by mouth daily.   atorvastatin (LIPITOR) 20 MG tablet Take 1 tablet (20 mg total) by mouth at bedtime.   Calcium Carbonate-Vitamin D 500-125 MG-UNIT TABS Take by mouth daily. Patient takes it only 3 days a week.   furosemide (LASIX) 20 MG tablet Take 1 tablet (20 mg total) by mouth daily.   lisinopril (ZESTRIL) 10 MG tablet Take 1 tablet (10 mg total) by mouth daily.   omeprazole (PRILOSEC) 20 MG capsule TAKE 1 CAPSULE BY MOUTH DAILY   polyethylene glycol powder (GLYCOLAX/MIRALAX) powder Take 17 g by mouth 3 (three) times daily as needed.   [DISCONTINUED] clobetasol cream (TEMOVATE) [id number] % Apply 1 Application topically 3 (three) times a week.   No facility-administered encounter medications on file as of 08/29/2022.    Allergies (verified) Patient has no known allergies.   History: Past Medical History:  Diagnosis Date   Arthritis    CKD (chronic kidney disease) stage 3, GFR 30-59 ml/min (HCC)    Hyperlipidemia    Hypertension    Vaginal  atrophy    Past Surgical History:  Procedure Laterality Date   HEMORROIDECTOMY     HERNIA REPAIR     Family History  Problem Relation Age of Onset   Arthritis Mother    Hyperlipidemia Mother    Hypertension Mother    Heart disease Father    Hypertension Father    Heart attack Father    Cancer Sister        uterus   Hypertension Sister    Heart disease Brother    Heart attack Brother    Hypertension Brother    Stroke Maternal Grandmother    Heart attack Brother    Hypertension Brother    Diabetes Neg Hx    Breast cancer Neg Hx    Social History   Socioeconomic History   Marital status: Married    Spouse name: Not on file   Number of children: Not on file   Years of education: Not on file   Highest education level: Not on file  Occupational History   Not on file  Tobacco Use   Smoking status: Former    Packs/day: 1.00    Years: 33.00    Additional pack years: 0.00    Total pack years: 33.00    Types: Cigarettes    Quit date: 06/06/2004    Years since quitting:  18.2   Smokeless tobacco: Never  Vaping Use   Vaping Use: Never used  Substance and Sexual Activity   Alcohol use: No   Drug use: No   Sexual activity: Never  Other Topics Concern   Not on file  Social History Narrative   Not on file   Social Determinants of Health   Financial Resource Strain: Low Risk  (08/29/2022)   Overall Financial Resource Strain (CARDIA)    Difficulty of Paying Living Expenses: Not hard at all  Food Insecurity: No Food Insecurity (08/29/2022)   Hunger Vital Sign    Worried About Running Out of Food in the Last Year: Never true    Ran Out of Food in the Last Year: Never true  Transportation Needs: No Transportation Needs (08/29/2022)   PRAPARE - Hydrologist (Medical): No    Lack of Transportation (Non-Medical): No  Physical Activity: Sufficiently Active (08/29/2022)   Exercise Vital Sign    Days of Exercise per Week: 7 days    Minutes of  Exercise per Session: 30 min  Stress: No Stress Concern Present (08/29/2022)   [place]    Feeling of Stress : Not at all  Social Connections: Moderately Isolated (08/29/2022)   Social Connection and Isolation Panel [NHANES]    Frequency of Communication with Friends and Family: Once a week    Frequency of Social Gatherings with Friends and Family: Once a week    Attends Religious Services: More than 4 times per year    Active Member of [employer] or Organizations: No    Attends Music therapist: Never    Marital Status: Married    Tobacco Counseling Counseling given: Not Answered   Clinical Intake:  Pre-visit preparation completed: Yes  Pain : No/denies pain     Nutritional Risks: None Diabetes: No  How often do you need to have someone help you when you read instructions, pamphlets, or other written materials from your doctor or pharmacy?: 1 - Never  Diabetic?no  Interpreter Needed?: No  Information entered by :: Kirke Shaggy, LPN   Activities of Daily Living    08/29/2022    9:52 AM 08/29/2022    9:15 AM  In your present state of health, do you have any difficulty performing the following activities:  Hearing? 0 0  Vision? 0 0  Difficulty concentrating or making decisions? 0 0  Walking or climbing stairs? 0 0  Dressing or bathing? 0 0  Doing errands, shopping? 0 0  Preparing Food and eating ? N   Using the Toilet? N   In the past six months, have you accidently leaked urine? N   Do you have problems with loss of bowel control? N   Managing your Medications? N   Managing your Finances? N   Housekeeping or managing your Housekeeping? N     Patient Care Team: Valerie Roys, DO as PCP - General (Family Medicine) Kate Sable, MD as Consulting Physician (Cardiology)  Indicate any recent Medical Services you may have received from other than [facility] providers in the past year  (date may be approximate).     Assessment:   This is a routine wellness examination for Jahnice.  Hearing/Vision screen Hearing Screening - Comments:: No aids Vision Screening - Comments:: Wears glasses- [facility]  Dietary issues and exercise activities discussed: Current Exercise Habits: Home exercise routine, Type of exercise: walking, Time (Minutes): 30, Frequency (Times/Week): 7,  Weekly Exercise (Minutes/Week): 210, Intensity: Mild   Goals Addressed             This Visit's Progress    DIET - EAT MORE FRUITS AND VEGETABLES         Depression Screen    08/29/2022    9:49 AM 08/29/2022    9:00 AM 01/05/2022    9:53 AM 08/09/2021   12:20 PM 07/08/2021    8:44 AM 03/30/2021    9:44 AM 02/03/2021    9:49 AM  PHQ 2/9 Scores  PHQ - 2 Score 0 0 0 0 0 0 0  PHQ- 9 Score 0 0 0 0 0 3     Fall Risk    08/29/2022    9:00 AM 08/09/2021   12:21 PM 08/09/2021   12:06 PM 07/08/2021    8:43 AM 02/03/2021    9:49 AM  Fall Risk   Falls in the past year? 0 0 0 0 0  Number falls in past yr: 0 0 0 0 0  Injury with Fall? 0 0 0 0 0  Risk for fall due to : No Fall Risks   No Fall Risks No Fall Risks  Follow up Falls evaluation completed Falls evaluation completed Falls evaluation completed;Falls prevention discussed;Education provided Falls evaluation completed Falls evaluation completed    FALL RISK PREVENTION PERTAINING TO THE HOME:  Any stairs in or around the home? Yes  If so, are there any without handrails? No  Home free of loose throw rugs in walkways, pet beds, electrical cords, etc? Yes  Adequate lighting in your home to reduce risk of falls? Yes   ASSISTIVE DEVICES UTILIZED TO PREVENT FALLS:  Life alert? No  Use of a cane, walker or w/c? No  Grab bars in the bathroom? Yes  Shower chair or bench in shower? Yes  Elevated toilet seat or a handicapped toilet? No   TIMED UP AND GO:  Was the test performed? Yes .  Length of time to ambulate 10 feet: 4 sec.   Gait steady and fast  without use of assistive device  Cognitive Function:done by MD during PE 2024        08/29/2022    9:16 AM 08/07/2020    2:32 PM 04/23/2019    9:56 AM 04/12/2018    8:29 AM  6CIT Screen  What Year? 4 points 0 points 0 points 0 points  What month? 0 points 0 points 0 points 0 points  What time? 0 points 0 points 0 points 0 points  Count back from 20 0 points 0 points 0 points 0 points  Months in reverse 0 points 0 points 0 points 0 points  Repeat phrase 0 points 4 points 0 points 2 points  Total Score 4 points 4 points 0 points 2 points    Immunizations Immunization History  Administered Date(s) Administered   Fluad Quad(high Dose 65+) 02/18/2019, 03/02/2020, 03/01/2021, 03/01/2022   Influenza, High Dose Seasonal PF 03/12/2018   Influenza,inj,Quad PF,6+ Mos 04/21/2016, 03/07/2017   Pneumococcal Conjugate-13 03/06/2019   Pneumococcal Polysaccharide-23 07/08/2021   Tdap 03/02/2016    TDAP status: Up to date  Flu Vaccine status: Up to date  Pneumococcal vaccine status: Up to date  Covid-19 vaccine status: Declined, Education has been provided regarding the importance of this vaccine but patient still declined. Advised may receive this vaccine at local pharmacy or Health Dept.or vaccine clinic. Aware to provide a copy of the vaccination record if obtained from local pharmacy  or Health Dept. Verbalized acceptance and understanding.  Qualifies for Shingles Vaccine? Yes   Zostavax completed No   Shingrix Completed?: No.    Education has been provided regarding the importance of this vaccine. Patient has been advised to call insurance company to determine out of pocket expense if they have not yet received this vaccine. Advised may also receive vaccine at local pharmacy or Health Dept. Verbalized acceptance and understanding.  Screening Tests Health Maintenance  Topic Date Due   COVID-19 Vaccine (1) Never done   Zoster Vaccines- Shingrix (1 of 2) Never done   Medicare Annual  Wellness (AWV)  08/29/2023   MAMMOGRAM  03/04/2024   Fecal DNA (Cologuard)  07/21/2024   DEXA SCAN  03/04/2025   DTaP/Tdap/Td (2 - Td or Tdap) 03/02/2026   Pneumonia Vaccine 6+ Years old  Completed   INFLUENZA VACCINE  Completed   Hepatitis C Screening  Completed   HPV VACCINES  Aged Out    Health Maintenance  Health Maintenance Due  Topic Date Due   COVID-19 Vaccine (1) Never done   Zoster Vaccines- Shingrix (1 of 2) Never done    Colorectal cancer screening: Type of screening: Cologuard. Completed 07/21/21. Repeat every 3 years  Mammogram status: Completed 03/04/22. Repeat every year  Bone Density status: Completed 03/04/22. Results reflect: Bone density results: OSTEOPOROSIS. Repeat every 2 years.  Lung Cancer Screening: (Low Dose CT Chest recommended if Age 37-80 years, 30 pack-year currently smoking OR have quit w/in 15years.) does not qualify.   Additional Screening:  Hepatitis C Screening: does qualify; Completed 03/07/17  Vision Screening: Recommended annual ophthalmology exams for early detection of glaucoma and other disorders of the eye. Is the patient up to date with their annual eye exam?  Yes  Who is the provider or what is the name of the office in which the patient attends annual eye exams? [place] If pt is not established with a provider, would they like to be referred to a provider to establish care? No .   Dental Screening: Recommended annual dental exams for proper oral hygiene  Community Resource Referral / Chronic Care Management: CRR required this visit?  No   CCM required this visit?  No      Plan:     I have personally reviewed and noted the following in the patient's chart:   Medical and social history Use of alcohol, tobacco or illicit drugs  Current medications and supplements including opioid prescriptions. Patient is not currently taking opioid prescriptions. Functional ability and status Nutritional status Physical  activity Advanced directives List of other physicians Hospitalizations, surgeries, and ER visits in previous 12 months Vitals Screenings to include cognitive, depression, and falls Referrals and appointments  In addition, I have reviewed and discussed with patient certain preventive protocols, quality metrics, and best practice recommendations. A written personalized care plan for preventive services as well as general preventive health recommendations were provided to patient.     Dionisio David, LPN   [id number]   Nurse Notes: none

## 2022-08-29 NOTE — Assessment & Plan Note (Signed)
Rechecking labs today. Await results. Treat as needed.  °

## 2022-08-29 NOTE — Assessment & Plan Note (Signed)
Under good control on current regimen. Continue current regimen. Continue to monitor. Call with any concerns. Refills up to date. Continue to monitor.   

## 2022-08-30 LAB — COMPREHENSIVE METABOLIC PANEL
ALT: 18 IU/L (ref 0–32)
AST: 21 IU/L (ref 0–40)
Albumin/Globulin Ratio: 1.7 (ref 1.2–2.2)
Albumin: 4.1 g/dL (ref 3.9–4.9)
Alkaline Phosphatase: 87 IU/L (ref 44–121)
BUN/Creatinine Ratio: 14 (ref 12–28)
BUN: 15 mg/dL (ref 8–27)
Bilirubin Total: 0.4 mg/dL (ref 0.0–1.2)
CO2: 17 mmol/L — ABNORMAL LOW (ref 20–29)
Calcium: 9.4 mg/dL (ref 8.7–10.3)
Chloride: 106 mmol/L (ref 96–106)
Creatinine, Ser: 1.06 mg/dL — ABNORMAL HIGH (ref 0.57–1.00)
Globulin, Total: 2.4 g/dL (ref 1.5–4.5)
Glucose: 83 mg/dL (ref 70–99)
Potassium: 4.8 mmol/L (ref 3.5–5.2)
Sodium: 143 mmol/L (ref 134–144)
Total Protein: 6.5 g/dL (ref 6.0–8.5)
eGFR: 57 mL/min/{1.73_m2} — ABNORMAL LOW (ref >59)

## 2022-08-30 LAB — CBC WITH DIFFERENTIAL/PLATELET
Basophils Absolute: 0 10*3/uL (ref 0.0–0.2)
Basos: 0 %
EOS (ABSOLUTE): 0.3 10*3/uL (ref 0.0–0.4)
Eos: 4 %
Hematocrit: 46.5 % (ref 34.0–46.6)
Hemoglobin: 14.8 g/dL (ref 11.1–15.9)
Immature Grans (Abs): 0 10*3/uL (ref 0.0–0.1)
Immature Granulocytes: 0 %
Lymphocytes Absolute: 1.5 10*3/uL (ref 0.7–3.1)
Lymphs: 18 %
MCH: 26.8 pg (ref 26.6–33.0)
MCHC: 31.8 g/dL (ref 31.5–35.7)
MCV: 84 fL (ref 79–97)
Monocytes Absolute: 0.6 10*3/uL (ref 0.1–0.9)
Monocytes: 7 %
Neutrophils Absolute: 5.8 10*3/uL (ref 1.4–7.0)
Neutrophils: 71 %
Platelets: 242 10*3/uL (ref 150–450)
RBC: 5.53 x10E6/uL — ABNORMAL HIGH (ref 3.77–5.28)
RDW: 14.7 % (ref 11.7–15.4)
WBC: 8.3 10*3/uL (ref 3.4–10.8)

## 2022-08-30 LAB — TSH: TSH: 3.56 u[IU]/mL (ref 0.450–4.500)

## 2022-08-30 LAB — LIPID PANEL W/O CHOL/HDL RATIO
Cholesterol, Total: 174 mg/dL (ref 100–199)
HDL: 60 mg/dL (ref >39)
LDL Chol Calc (NIH): 98 mg/dL (ref 0–99)
Triglycerides: 86 mg/dL (ref 0–149)
VLDL Cholesterol Cal: 16 mg/dL (ref 5–40)

## 2022-09-02 ENCOUNTER — Encounter: Payer: Self-pay | Admitting: Family Medicine

## 2022-09-15 ENCOUNTER — Other Ambulatory Visit: Payer: Self-pay | Admitting: Family Medicine

## 2022-09-16 NOTE — Telephone Encounter (Signed)
Unable to refill per protocol, Rx request is too soon. Last refill 07/12/22 for 90 and 1 refill.  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

## 2022-10-08 ENCOUNTER — Other Ambulatory Visit: Payer: Self-pay | Admitting: Family Medicine

## 2022-10-10 NOTE — Telephone Encounter (Signed)
Requested Prescriptions  Refused Prescriptions Disp Refills   omeprazole (PRILOSEC) 20 MG capsule [Pharmacy Med Name: Omeprazole 20 MG Oral Capsule Delayed Release] 100 capsule 2    Sig: TAKE 1 CAPSULE BY MOUTH DAILY     Gastroenterology: Proton Pump Inhibitors Passed - 10/08/2022 10:48 PM      Passed - Valid encounter within last 12 months    Recent Outpatient Visits           1 month ago Encounter for [employer] annual wellness exam   [place] [facility] [place], Megan P, DO   9 months ago Benign hypertensive renal disease   [place] [facility] [place], Megan P, DO   1 year ago Routine general medical examination at a health care facility   [facility] [place], [place] P, DO   1 year ago Hypertension, unspecified type   [place] [facility] [place], Megan P, DO   1 year ago Hypertension, unspecified type   [place] [facility] [name], DO       Future Appointments             In 4 months Laural Benes, Oralia Rud, DO  [facility], [facility]

## 2022-11-27 ENCOUNTER — Other Ambulatory Visit: Payer: Self-pay | Admitting: Family Medicine

## 2022-11-28 NOTE — Telephone Encounter (Signed)
Appointment 03/01/23- ok to change for on [id number] Requested Prescriptions  Pending Prescriptions Disp Refills   lisinopril (ZESTRIL) 10 MG tablet [Pharmacy Med Name: Lisinopril 10 MG Oral Tablet] 100 tablet 2    Sig: TAKE 1 TABLET BY MOUTH DAILY     Cardiovascular:  ACE Inhibitors Failed - 11/27/2022  4:55 AM      Failed - Cr in normal range and within 180 days    Creatinine, Ser  Date Value Ref Range Status  08/29/2022 1.06 (H) 0.57 - 1.00 mg/dL Final         Passed - K in normal range and within 180 days    Potassium  Date Value Ref Range Status  08/29/2022 4.8 3.5 - 5.2 mmol/L Final         Passed - Patient is not pregnant      Passed - Last BP in normal range    BP Readings from Last 1 Encounters:  08/29/22 130/82         Passed - Valid encounter within last 6 months    Recent Outpatient Visits           3 months ago Encounter for [employer] annual wellness exam   [place] [facility] [place], Megan P, DO   10 months ago Benign hypertensive renal disease   [place] [facility] [place], Megan P, DO   1 year ago Routine general medical examination at a health care facility   [facility] [place], [place] P, DO   1 year ago Hypertension, unspecified type   [place] [facility] [place], Megan P, DO   1 year ago Hypertension, unspecified type   [place] [facility] [name], DO       Future Appointments             In 3 months Laural Benes, Oralia Rud, DO [place] [facility], [facility]

## 2022-12-01 ENCOUNTER — Other Ambulatory Visit: Payer: Self-pay | Admitting: Family Medicine

## 2022-12-01 NOTE — Telephone Encounter (Signed)
Requested Prescriptions  Pending Prescriptions Disp Refills   omeprazole (PRILOSEC) 20 MG capsule [Pharmacy Med Name: Omeprazole 20 MG Oral Capsule Delayed Release] 90 capsule 3    Sig: TAKE 1 CAPSULE BY MOUTH DAILY     Gastroenterology: Proton Pump Inhibitors Passed - 12/01/2022  1:27 AM      Passed - Valid encounter within last 12 months    Recent Outpatient Visits           3 months ago Encounter for [employer] annual wellness exam   [facility] [facility] [place], Megan P, DO   11 months ago Benign hypertensive renal disease   [place] [facility] [place], Megan P, DO   1 year ago Routine general medical examination at a health care facility   [facility] [place], [place] P, DO   1 year ago Hypertension, unspecified type   [place] [facility] [place], Megan P, DO   1 year ago Hypertension, unspecified type   [place] [facility] [name], DO       Future Appointments             In 3 months Laural Benes, Oralia Rud, DO [place] [facility], [facility]

## 2022-12-30 ENCOUNTER — Other Ambulatory Visit: Payer: Self-pay | Admitting: Family Medicine

## 2022-12-30 NOTE — Telephone Encounter (Signed)
Requested Prescriptions  Pending Prescriptions Disp Refills   atorvastatin (LIPITOR) 20 MG tablet [Pharmacy Med Name: Atorvastatin Calcium 20 MG Oral Tablet] 100 tablet 0    Sig: TAKE 1 TABLET BY MOUTH AT  BEDTIME     Cardiovascular:  Antilipid - Statins Failed - 12/30/2022  4:28 AM      Failed - Lipid Panel in normal range within the last 12 months    Cholesterol, Total  Date Value Ref Range Status  08/29/2022 174 100 - 199 mg/dL Final   LDL Chol Calc (NIH)  Date Value Ref Range Status  08/29/2022 98 0 - 99 mg/dL Final   HDL  Date Value Ref Range Status  08/29/2022 60 >39 mg/dL Final   Triglycerides  Date Value Ref Range Status  08/29/2022 86 0 - 149 mg/dL Final         Passed - Patient is not pregnant      Passed - Valid encounter within last 12 months    Recent Outpatient Visits           4 months ago Encounter for [employer] annual wellness exam   [place] [facility] [place], Megan P, DO   11 months ago Benign hypertensive renal disease   [place] [facility], Megan P, DO   1 year ago Routine general medical examination at a health care facility   [facility] [place], [place] P, DO   1 year ago Hypertension, unspecified type   [place] [facility] [place], Megan P, DO   1 year ago Hypertension, unspecified type   [place] [facility] [name], DO       Future Appointments             In 2 months Laural Benes, Oralia Rud, DO [place] [facility], [facility]

## 2023-01-09 ENCOUNTER — Other Ambulatory Visit: Payer: Self-pay | Admitting: Family Medicine

## 2023-01-10 NOTE — Telephone Encounter (Signed)
Unable to refill per protocol, Rx request is too soon. Last refill 03/17/22 for 4 and 11 refills.  Requested Prescriptions  Pending Prescriptions Disp Refills   alendronate (FOSAMAX) 70 MG tablet [Pharmacy Med Name: Alendronate Sodium 70 MG Oral Tablet] 12 tablet 3    Sig: TAKE 1 TABLET BY MOUTH WEEKLY  WITH 8 OZ OF PLAIN WATER 30  MINUTES BEFORE FIRST FOOD, DRINK OR MEDS. STAY UPRIGHT FOR 30  MINS     Endocrinology:  Bisphosphonates Failed - 01/09/2023  4:54 AM      Failed - Vitamin D in normal range and within 360 days    Vit D, 25-Hydroxy  Date Value Ref Range Status  07/06/2020 40.7 30.0 - 100.0 ng/mL Final    Comment:    Vitamin D deficiency has been defined by the Institute of Medicine and an Endocrine Society practice guideline as a level of serum 25-OH vitamin D less than 20 ng/mL (1,2). The Endocrine Society went on to further define vitamin D insufficiency as a level between 21 and 29 ng/mL (2). 1. IOM (Institute of Medicine). 2010. Dietary reference    intakes for calcium and D. [place]: The    [employer]. 2. Holick MF, Binkley Jerome, Bischoff-Ferrari HA, et al.    Evaluation, treatment, and prevention of vitamin D    deficiency: an Endocrine Society clinical practice    guideline. JCEM. 2011 Jul; 96(7):1911-30.          Failed - Cr in normal range and within 360 days    Creatinine, Ser  Date Value Ref Range Status  08/29/2022 1.06 (H) 0.57 - 1.00 mg/dL Final         Failed - Mg Level in normal range and within 360 days    Magnesium  Date Value Ref Range Status  03/02/2015 2.0 1.6 - 2.3 mg/dL Final         Failed - Phosphate in normal range and within 360 days    No results found for: "PHOS"       Passed - Ca in normal range and within 360 days    Calcium  Date Value Ref Range Status  08/29/2022 9.4 8.7 - 10.3 mg/dL Final         Passed - eGFR is 30 or above and within 360 days    GFR calc Af Amer  Date Value Ref Range Status  07/06/2020  66 >59 mL/min/1.73 Final    Comment:    **In accordance with recommendations from the NKF-ASN Task force,**   [facility] is in the process of updating its eGFR calculation to the   2021 CKD-EPI creatinine equation that estimates kidney function   without a race variable.    GFR calc non Af Amer  Date Value Ref Range Status  07/06/2020 57 (L) >59 mL/min/1.73 Final   eGFR  Date Value Ref Range Status  08/29/2022 57 (L) >59 mL/min/1.73 Final         Passed - Valid encounter within last 12 months    Recent Outpatient Visits           4 months ago Encounter for [employer] annual wellness exam   [place] [facility] [place], [place] P, DO   1 year ago Benign hypertensive renal disease   [place] [facility] [name], DO   1 year ago Routine general medical examination at a health care facility   [facility], Megan P, DO  1 year ago Hypertension, unspecified type   [place] [facility] [place], [place] P, DO   1 year ago Hypertension, unspecified type   [place] [facility] [name], DO       Future Appointments             In 1 month [place], Oralia Rud, DO [place] [facility]            Passed - Bone Mineral Density or Dexa Scan completed in the last 2 years

## 2023-01-24 ENCOUNTER — Other Ambulatory Visit: Payer: Self-pay | Admitting: Family Medicine

## 2023-01-24 DIAGNOSIS — Z1231 Encounter for screening mammogram for malignant neoplasm of breast: Secondary | ICD-10-CM

## 2023-02-10 ENCOUNTER — Other Ambulatory Visit: Payer: Self-pay | Admitting: Family Medicine

## 2023-02-13 NOTE — Telephone Encounter (Signed)
Requested medications are due for refill today.  yes  Requested medications are on the active medications list.  yes  Last refill. 03/17/2022 #4 11 rf  Future visit scheduled.   yes  Notes to clinic.  Expired lab.    Requested Prescriptions  Pending Prescriptions Disp Refills   alendronate (FOSAMAX) 70 MG tablet [Pharmacy Med Name: Alendronate Sodium 70 MG Oral Tablet] 12 tablet 3    Sig: TAKE 1 TABLET BY MOUTH WEEKLY  WITH 8 OZ OF PLAIN WATER 30  MINUTES BEFORE FIRST FOOD, DRINK OR MEDS. STAY UPRIGHT FOR 30  MINS     Endocrinology:  Bisphosphonates Failed - 02/10/2023  7:18 PM      Failed - Vitamin D in normal range and within 360 days    Vit D, 25-Hydroxy  Date Value Ref Range Status  07/06/2020 40.7 30.0 - 100.0 ng/mL Final    Comment:    Vitamin D deficiency has been defined by the Institute of Medicine and an Endocrine Society practice guideline as a level of serum 25-OH vitamin D less than 20 ng/mL (1,2). The Endocrine Society went on to further define vitamin D insufficiency as a level between 21 and 29 ng/mL (2). 1. IOM (Institute of Medicine). 2010. Dietary reference    intakes for calcium and D. [place]: The    [employer]. 2. Holick MF, Binkley Martin, Bischoff-Ferrari HA, et al.    Evaluation, treatment, and prevention of vitamin D    deficiency: an Endocrine Society clinical practice    guideline. JCEM. 2011 Jul; 96(7):1911-30.          Failed - Cr in normal range and within 360 days    Creatinine, Ser  Date Value Ref Range Status  08/29/2022 1.06 (H) 0.57 - 1.00 mg/dL Final         Failed - Mg Level in normal range and within 360 days    Magnesium  Date Value Ref Range Status  03/02/2015 2.0 1.6 - 2.3 mg/dL Final         Failed - Phosphate in normal range and within 360 days    No results found for: "PHOS"       Passed - Ca in normal range and within 360 days    Calcium  Date Value Ref Range Status  08/29/2022 9.4 8.7 - 10.3 mg/dL  Final         Passed - eGFR is 30 or above and within 360 days    GFR calc Af Amer  Date Value Ref Range Status  07/06/2020 66 >59 mL/min/1.73 Final    Comment:    **In accordance with recommendations from the NKF-ASN Task force,**   [facility] is in the process of updating its eGFR calculation to the   2021 CKD-EPI creatinine equation that estimates kidney function   without a race variable.    GFR calc non Af Amer  Date Value Ref Range Status  07/06/2020 57 (L) >59 mL/min/1.73 Final   eGFR  Date Value Ref Range Status  08/29/2022 57 (L) >59 mL/min/1.73 Final         Passed - Valid encounter within last 12 months    Recent Outpatient Visits           5 months ago Encounter for [employer] annual wellness exam   [place] [facility] [place], [place] P, DO   1 year ago Benign hypertensive renal disease   [place] [facility] [place], Megan P, DO  1 year ago Routine general medical examination at a health care facility   [facility] [place], [place] P, DO   1 year ago Hypertension, unspecified type   [place] [facility] [place], [place] P, DO   1 year ago Hypertension, unspecified type   [place] [facility] [name], DO       Future Appointments             In 2 weeks Laural Benes, Oralia Rud, DO [place] [facility]            Passed - Bone Mineral Density or Dexa Scan completed in the last 2 years

## 2023-03-01 ENCOUNTER — Encounter: Payer: Self-pay | Admitting: Family Medicine

## 2023-03-01 ENCOUNTER — Ambulatory Visit (INDEPENDENT_AMBULATORY_CARE_PROVIDER_SITE_OTHER): Payer: Medicare Other | Admitting: Family Medicine

## 2023-03-01 VITALS — BP 162/91 | HR 76 | Ht 62.0 in | Wt 156.4 lb

## 2023-03-01 DIAGNOSIS — L900 Lichen sclerosus et atrophicus: Secondary | ICD-10-CM | POA: Diagnosis not present

## 2023-03-01 DIAGNOSIS — D692 Other nonthrombocytopenic purpura: Secondary | ICD-10-CM

## 2023-03-01 DIAGNOSIS — I129 Hypertensive chronic kidney disease with stage 1 through stage 4 chronic kidney disease, or unspecified chronic kidney disease: Secondary | ICD-10-CM | POA: Diagnosis not present

## 2023-03-01 DIAGNOSIS — E782 Mixed hyperlipidemia: Secondary | ICD-10-CM

## 2023-03-01 NOTE — Assessment & Plan Note (Signed)
Getting progressively worse. Clobetasol not helping at all. Will refer to GYN. Await their input.

## 2023-03-01 NOTE — Progress Notes (Signed)
BP (!) 162/91 (BP Location: Left Arm, Cuff Size: Normal)   Pulse 76   Ht 5\' 2"  (1.575 m)   Wt 156 lb 6.4 oz (70.9 kg)   SpO2 100%   BMI 28.61 kg/m    Subjective:    Patient ID: Jasmine Church, female    DOB: 06-23-1950, 72 y.o.   MRN: [id number]  HPI: Jasmine Church is a 72 y.o. female  Chief Complaint  Patient presents with   Hyperlipidemia   Medication Problem    Patient says the Clobetasol cream isn't helping her and says her bottom feels like it is raw and burning. Patient says she can apply the cream at night and by the morning, she is waking up with the itching.    Bleeding/Bruising    Patient says fell a week ago Sunday, and she noticed a bruise on her R lower leg. Patient says she then noticed on Friday a bruise in her upper R thigh area. Patient thinks the new bruise may come from her "bottom." Patient would like provider to take a look at the area today.    Flu Vaccine    Patient was offered Flu vaccine at today's visit and says she wants to wait and have it when they come back for her husband's B-12 injection to have them together.    Hypertension   HYPERTENSION / HYPERLIPIDEMIA- just got very upsetting news and is upset at this time.  Satisfied with current treatment? yes Duration of hypertension: chronic BP monitoring frequency: rarely BP medication side effects: no Past BP meds: lisinopril, lasix Duration of hyperlipidemia: chronic Cholesterol medication side effects: no Cholesterol supplements: none Past cholesterol medications: atorvastatin Medication compliance: excellent compliance Aspirin: yes Recent stressors: no Recurrent headaches: no Visual changes: no Palpitations: no Dyspnea: no Chest pain: no Lower extremity edema: no Dizzy/lightheaded: no  Vaginal irritation is worse. Not feeling well. Clobetasol not helping. Digging at her vagina at night while she's sleeping causing issues with bruising.   Relevant past medical, surgical,  family and social history reviewed and updated as indicated. Interim medical history since our last visit reviewed. Allergies and medications reviewed and updated.  Review of Systems  Constitutional: Negative.   Respiratory: Negative.    Cardiovascular: Negative.   Gastrointestinal: Negative.   Genitourinary:  Positive for vaginal pain. Negative for decreased urine volume, difficulty urinating, dyspareunia, dysuria, enuresis, flank pain, frequency, genital sores, hematuria, menstrual problem, pelvic pain, urgency, vaginal bleeding and vaginal discharge.  Musculoskeletal: Negative.   Hematological:  Negative for adenopathy. Bruises/bleeds easily.  Psychiatric/Behavioral: Negative.      Per HPI unless specifically indicated above     Objective:    BP (!) 162/91 (BP Location: Left Arm, Cuff Size: Normal)   Pulse 76   Ht 5\' 2"  (1.575 m)   Wt 156 lb 6.4 oz (70.9 kg)   SpO2 100%   BMI 28.61 kg/m   Wt Readings from Last 3 Encounters:  03/01/23 156 lb 6.4 oz (70.9 kg)  08/29/22 159 lb (72.1 kg)  08/29/22 159 lb 12.8 oz (72.5 kg)    Physical Exam Vitals and nursing note reviewed.  Constitutional:      General: She is not in acute distress.    Appearance: Normal appearance. She is not ill-appearing, toxic-appearing or diaphoretic.  HENT:     Head: Normocephalic and atraumatic.     Right Ear: External ear normal.     Left Ear: External ear normal.     Nose: Nose normal.  Mouth/Throat:     Mouth: Mucous membranes are moist.     Pharynx: Oropharynx is clear.  Eyes:     General: No scleral icterus.       Right eye: No discharge.        Left eye: No discharge.     Extraocular Movements: Extraocular movements intact.     Conjunctiva/sclera: Conjunctivae normal.     Pupils: Pupils are equal, round, and reactive to light.  Cardiovascular:     Rate and Rhythm: Normal rate and regular rhythm.     Pulses: Normal pulses.     Heart sounds: Normal heart sounds. No murmur heard.     No friction rub. No gallop.  Pulmonary:     Effort: Pulmonary effort is normal. No respiratory distress.     Breath sounds: Normal breath sounds. No stridor. No wheezing, rhonchi or rales.  Chest:     Chest wall: No tenderness.  Musculoskeletal:        General: Normal range of motion.     Cervical back: Normal range of motion and neck supple.  Skin:    General: Skin is warm and dry.     Capillary Refill: Capillary refill takes less than 2 seconds.     Coloration: Skin is not jaundiced or pale.     Findings: Bruising (R inner thigh) present. No erythema, lesion or rash.  Neurological:     General: No focal deficit present.     Mental Status: She is alert and oriented to person, place, and time. Mental status is at baseline.  Psychiatric:        Mood and Affect: Mood normal.        Behavior: Behavior normal.        Thought Content: Thought content normal.        Judgment: Judgment normal.     Results for orders placed or performed in visit on 08/29/22  Microscopic Examination   Urine  Result Value Ref Range   WBC, UA 0-5 0 - 5 /hpf   RBC, Urine 0-2 0 - 2 /hpf   Epithelial Cells (non renal) 0-10 0 - 10 /hpf   Bacteria, UA None seen None seen/Few  CBC with Differential/Platelet  Result Value Ref Range   WBC 8.3 3.4 - 10.8 x10E3/uL   RBC 5.53 (H) 3.77 - 5.28 x10E6/uL   Hemoglobin 14.8 11.1 - 15.9 g/dL   Hematocrit 81.1 91.4 - 46.6 %   MCV 84 79 - 97 fL   MCH 26.8 26.6 - 33.0 pg   MCHC 31.8 31.5 - 35.7 g/dL   RDW 78.2 95.6 - 21.3 %   Platelets 242 150 - 450 x10E3/uL   Neutrophils 71 Not Estab. %   Lymphs 18 Not Estab. %   Monocytes 7 Not Estab. %   Eos 4 Not Estab. %   Basos 0 Not Estab. %   Neutrophils Absolute 5.8 1.4 - 7.0 x10E3/uL   Lymphocytes Absolute 1.5 0.7 - 3.1 x10E3/uL   Monocytes Absolute 0.6 0.1 - 0.9 x10E3/uL   EOS (ABSOLUTE) 0.3 0.0 - 0.4 x10E3/uL   Basophils Absolute 0.0 0.0 - 0.2 x10E3/uL   Immature Granulocytes 0 Not Estab. %   Immature Grans (Abs)  0.0 0.0 - 0.1 x10E3/uL  Comprehensive metabolic panel  Result Value Ref Range   Glucose 83 70 - 99 mg/dL   BUN 15 8 - 27 mg/dL   Creatinine, Ser 0.86 (H) 0.57 - 1.00 mg/dL   eGFR 57 (L) >57 QI/ONG/2.95  BUN/Creatinine Ratio 14 12 - 28   Sodium 143 134 - 144 mmol/L   Potassium 4.8 3.5 - 5.2 mmol/L   Chloride 106 96 - 106 mmol/L   CO2 17 (L) 20 - 29 mmol/L   Calcium 9.4 8.7 - 10.3 mg/dL   Total Protein 6.5 6.0 - 8.5 g/dL   Albumin 4.1 3.9 - 4.9 g/dL   Globulin, Total 2.4 1.5 - 4.5 g/dL   Albumin/Globulin Ratio 1.7 1.2 - 2.2   Bilirubin Total 0.4 0.0 - 1.2 mg/dL   Alkaline Phosphatase 87 44 - 121 IU/L   AST 21 0 - 40 IU/L   ALT 18 0 - 32 IU/L  Lipid Panel w/o Chol/HDL Ratio  Result Value Ref Range   Cholesterol, Total 174 100 - 199 mg/dL   Triglycerides 86 0 - 149 mg/dL   HDL 60 >09 mg/dL   VLDL Cholesterol Cal 16 5 - 40 mg/dL   LDL Chol Calc (NIH) 98 0 - 99 mg/dL  Urinalysis, Routine w reflex microscopic  Result Value Ref Range   Specific Gravity, UA 1.020 1.005 - 1.030   pH, UA 5.0 5.0 - 7.5   Color, UA Yellow Yellow   Appearance Ur Clear Clear   Leukocytes,UA Trace (A) Negative   Protein,UA Negative Negative/Trace   Glucose, UA Negative Negative   Ketones, UA Negative Negative   RBC, UA 2+ (A) Negative   Bilirubin, UA Negative Negative   Urobilinogen, Ur 0.2 0.2 - 1.0 mg/dL   Nitrite, UA Negative Negative   Microscopic Examination See below:   TSH  Result Value Ref Range   TSH 3.560 0.450 - 4.500 uIU/mL  Microalbumin, Urine Waived  Result Value Ref Range   Microalb, Ur Waived 30 (H) 0 - 19 mg/L   Creatinine, Urine Waived 100 10 - 300 mg/dL   Microalb/Creat Ratio <30 <30 mg/g      Assessment & Plan:   Problem List Items Addressed This Visit       Cardiovascular and Mediastinum   Senile purpura (HCC)    Reassured patient. Continue to monitor.        Genitourinary   Benign hypertensive renal disease - Primary    Just found out one of her friends passed  away. Will continue current regimen. Continue to monitor. Call with any concerns.       Relevant Orders   Comprehensive metabolic panel   CBC with Differential/Platelet     Other   Hyperlipidemia    Under good control on current regimen. Continue current regimen. Continue to monitor. Call with any concerns. Refills given.  Labs drawn today.       Relevant Orders   Comprehensive metabolic panel   Lipid Panel w/o Chol/HDL Ratio   Lichen sclerosus et atrophicus    Getting progressively worse. Clobetasol not helping at all. Will refer to GYN. Await their input.       Relevant Orders   Ambulatory referral to Obstetrics / Gynecology     Follow up plan: Return in about 3 months (around 05/31/2023).

## 2023-03-01 NOTE — Assessment & Plan Note (Signed)
Reassured patient. Continue to monitor.

## 2023-03-01 NOTE — Assessment & Plan Note (Signed)
Under good control on current regimen. Continue current regimen. Continue to monitor. Call with any concerns. Refills given. Labs drawn today.   

## 2023-03-01 NOTE — Assessment & Plan Note (Signed)
Just found out one of her friends passed away. Will continue current regimen. Continue to monitor. Call with any concerns.

## 2023-03-02 ENCOUNTER — Encounter: Payer: Self-pay | Admitting: Family Medicine

## 2023-03-02 LAB — COMPREHENSIVE METABOLIC PANEL
ALT: 28 IU/L (ref 0–32)
AST: 27 IU/L (ref 0–40)
Albumin: 4.1 g/dL (ref 3.9–4.9)
Alkaline Phosphatase: 81 IU/L (ref 44–121)
BUN/Creatinine Ratio: 11 — ABNORMAL LOW (ref 12–28)
BUN: 12 mg/dL (ref 8–27)
Bilirubin Total: 0.5 mg/dL (ref 0.0–1.2)
CO2: 25 mmol/L (ref 20–29)
Calcium: 9.1 mg/dL (ref 8.7–10.3)
Chloride: 102 mmol/L (ref 96–106)
Creatinine, Ser: 1.1 mg/dL — ABNORMAL HIGH (ref 0.57–1.00)
Globulin, Total: 2.4 g/dL (ref 1.5–4.5)
Glucose: 96 mg/dL (ref 70–99)
Potassium: 4.4 mmol/L (ref 3.5–5.2)
Sodium: 140 mmol/L (ref 134–144)
Total Protein: 6.5 g/dL (ref 6.0–8.5)
eGFR: 54 mL/min/{1.73_m2} — ABNORMAL LOW (ref >59)

## 2023-03-02 LAB — CBC WITH DIFFERENTIAL/PLATELET
Basophils Absolute: 0 10*3/uL (ref 0.0–0.2)
Basos: 1 %
EOS (ABSOLUTE): 0.3 10*3/uL (ref 0.0–0.4)
Eos: 5 %
Hematocrit: 46.2 % (ref 34.0–46.6)
Hemoglobin: 14 g/dL (ref 11.1–15.9)
Immature Grans (Abs): 0 10*3/uL (ref 0.0–0.1)
Immature Granulocytes: 0 %
Lymphocytes Absolute: 1.8 10*3/uL (ref 0.7–3.1)
Lymphs: 30 %
MCH: 26.6 pg (ref 26.6–33.0)
MCHC: 30.3 g/dL — ABNORMAL LOW (ref 31.5–35.7)
MCV: 88 fL (ref 79–97)
Monocytes Absolute: 0.6 10*3/uL (ref 0.1–0.9)
Monocytes: 9 %
Neutrophils Absolute: 3.4 10*3/uL (ref 1.4–7.0)
Neutrophils: 55 %
Platelets: 241 10*3/uL (ref 150–450)
RBC: 5.26 x10E6/uL (ref 3.77–5.28)
RDW: 14.4 % (ref 11.7–15.4)
WBC: 6 10*3/uL (ref 3.4–10.8)

## 2023-03-02 LAB — LIPID PANEL W/O CHOL/HDL RATIO
Cholesterol, Total: 190 mg/dL (ref 100–199)
HDL: 57 mg/dL (ref >39)
LDL Chol Calc (NIH): 112 mg/dL — ABNORMAL HIGH (ref 0–99)
Triglycerides: 119 mg/dL (ref 0–149)
VLDL Cholesterol Cal: 21 mg/dL (ref 5–40)

## 2023-03-06 ENCOUNTER — Ambulatory Visit
Admission: RE | Admit: 2023-03-06 | Discharge: 2023-03-06 | Disposition: A | Discharge status: Home / Self Care | Payer: Medicare Other | Source: Ambulatory Visit | Attending: Family Medicine | Admitting: Family Medicine

## 2023-03-06 DIAGNOSIS — Z1231 Encounter for screening mammogram for malignant neoplasm of breast: Secondary | ICD-10-CM | POA: Insufficient documentation

## 2023-03-07 ENCOUNTER — Encounter: Payer: Self-pay | Admitting: Family Medicine

## 2023-03-13 NOTE — Progress Notes (Unsigned)
Johnson, Megan P, DO   No chief complaint on file.   HPI:      [name] is a 72 y.o. No obstetric history on file. whose LMP was No LMP recorded. Patient is postmenopausal., presents today for NP eval ????  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   Osteoporosis 06/07/2018   CAD (coronary artery disease) 05/17/2018   COPD (chronic obstructive pulmonary disease) (HCC) 05/17/2018   Hiatal hernia 05/17/2018   Personal history of tobacco use, presenting hazards to health 05/14/2018   Lichen sclerosus et atrophicus 09/05/2017   Hx of smoking 03/02/2015   Leg cramps 03/02/2015   Vaginal atrophy    Benign hypertensive renal disease    Hyperlipidemia    CKD (chronic kidney disease) stage 3, GFR 30-59 ml/min (HCC)     Past Surgical History:  Procedure Laterality Date   HEMORROIDECTOMY     HERNIA REPAIR      Family History  Problem Relation Age of Onset   Arthritis Mother    Hyperlipidemia Mother    Hypertension Mother    Heart disease Father    Hypertension Father    Heart attack Father    Cancer Sister        uterus   Hypertension Sister    Heart disease Brother    Heart attack Brother    Hypertension Brother    Stroke Maternal Grandmother    Heart attack Brother    Hypertension Brother    Diabetes Neg Hx    Breast cancer Neg Hx     Social History   Socioeconomic History   Marital status: Married    Spouse name: Not on file   Number of children: Not on file   Years of education: Not on file   Highest education level: Not on file  Occupational History   Not on file  Tobacco Use   Smoking status: Former    Current packs/day: 0.00    Average packs/day: 1 pack/day for 33.0 years (33.0 ttl pk-yrs)    Types:  Cigarettes    Start date: 06/07/1971    Quit date: 06/06/2004    Years since quitting: 18.7   Smokeless tobacco: Never  Vaping Use   Vaping status: Never Used  Substance and Sexual Activity   Alcohol use: No   Drug use: No   Sexual activity: Never  Other Topics Concern   Not on file  Social History Narrative   Not on file   Social Determinants of Health   Financial Resource Strain: Low Risk  (08/29/2022)   Overall Financial Resource Strain (CARDIA)    Difficulty of Paying Living Expenses: Not hard at all  Food Insecurity: No Food Insecurity (08/29/2022)   Hunger Vital Sign    Worried About Running Out of Food in the Last Year: Never true    Ran Out of Food in the Last Year: Never true  Transportation Needs: No Transportation Needs (08/29/2022)   PRAPARE - Administrator, Civil Service (Medical): No    Lack of Transportation (Non-Medical): No  Physical Activity: Sufficiently Active (08/29/2022)   Exercise Vital Sign    Days of Exercise per Week: 7 days    Minutes of Exercise per Session: 30 min  Stress: No Stress  Concern Present (08/29/2022)   [employer] of Occupational Health - Occupational Stress Questionnaire    Feeling of Stress : Not at all  Social Connections: Moderately Isolated (08/29/2022)   Social Connection and Isolation Panel [NHANES]    Frequency of Communication with Friends and Family: Once a week    Frequency of Social Gatherings with Friends and Family: Once a week    Attends Religious Services: More than 4 times per year    Active Member of [employer] or Organizations: No    Attends Banker Meetings: Never    Marital Status: Married  Catering manager Violence: Not At Risk (08/29/2022)   Humiliation, Afraid, Rape, and Kick questionnaire    Fear of Current or Ex-Partner: No    Emotionally Abused: No    Physically Abused: No    Sexually Abused: No    Outpatient Medications Prior to Visit  Medication Sig Dispense Refill   alendronate  (FOSAMAX) 70 MG tablet TAKE 1 TABLET BY MOUTH WEEKLY  WITH 8 OZ OF PLAIN WATER 30  MINUTES BEFORE FIRST FOOD, DRINK OR MEDS. STAY UPRIGHT FOR 30  MINS 12 tablet 0   aspirin EC 81 MG tablet Take 81 mg by mouth daily.     atorvastatin (LIPITOR) 20 MG tablet TAKE 1 TABLET BY MOUTH AT  BEDTIME 100 tablet 0   Calcium Carbonate-Vitamin D 500-125 MG-UNIT TABS Take by mouth daily. Patient takes it only 3 days a week.     clobetasol cream (TEMOVATE) 0.05 % Apply 1 Application topically 3 (three) times a week. 60 g 1   furosemide (LASIX) 20 MG tablet Take 1 tablet (20 mg total) by mouth daily. 90 tablet 1   lisinopril (ZESTRIL) 10 MG tablet TAKE 1 TABLET BY MOUTH DAILY 100 tablet 0   omeprazole (PRILOSEC) 20 MG capsule TAKE 1 CAPSULE BY MOUTH DAILY 90 capsule 3   polyethylene glycol powder (GLYCOLAX/MIRALAX) powder Take 17 g by mouth 3 (three) times daily as needed. 3350 g 1   No facility-administered medications prior to visit.      ROS:  Review of Systems BREAST: No symptoms   OBJECTIVE:   Vitals:  There were no vitals taken for this visit.  Physical Exam  Results: No results found for this or any previous visit (from the past 24 hour(s)).   Assessment/Plan: No diagnosis found.    No orders of the defined types were placed in this encounter.     No follow-ups on file.  Kina Shiffman B. Jahson Emanuele, PA-C 03/13/2023 4:51 PM

## 2023-03-14 ENCOUNTER — Ambulatory Visit: Payer: Medicare Other | Admitting: Obstetrics and Gynecology

## 2023-03-14 ENCOUNTER — Other Ambulatory Visit (HOSPITAL_COMMUNITY)
Admission: RE | Admit: 2023-03-14 | Discharge: 2023-03-14 | Disposition: A | Discharge status: Home / Self Care | Payer: Medicare Other | Source: Ambulatory Visit | Attending: Obstetrics and Gynecology | Admitting: Obstetrics and Gynecology

## 2023-03-14 ENCOUNTER — Encounter: Payer: Self-pay | Admitting: Obstetrics and Gynecology

## 2023-03-14 VITALS — BP 154/83 | HR 71 | Ht 62.0 in | Wt 156.0 lb

## 2023-03-14 DIAGNOSIS — L292 Pruritus vulvae: Secondary | ICD-10-CM | POA: Insufficient documentation

## 2023-03-14 NOTE — Patient Instructions (Signed)
I value your feedback and you entrusting us with your care. If you get a [facility] patient survey, I would appreciate you taking the time to let us know about your experience today. Thank you! ? ? ?

## 2023-03-20 ENCOUNTER — Ambulatory Visit (INDEPENDENT_AMBULATORY_CARE_PROVIDER_SITE_OTHER): Payer: Medicare Other

## 2023-03-20 DIAGNOSIS — Z23 Encounter for immunization: Secondary | ICD-10-CM | POA: Diagnosis not present

## 2023-03-21 LAB — SURGICAL PATHOLOGY

## 2023-03-23 ENCOUNTER — Other Ambulatory Visit: Payer: Self-pay | Admitting: Obstetrics and Gynecology

## 2023-03-23 DIAGNOSIS — L292 Pruritus vulvae: Secondary | ICD-10-CM

## 2023-03-23 MED ORDER — TACROLIMUS 0.1 % EX OINT
TOPICAL_OINTMENT | Freq: Two times a day (BID) | CUTANEOUS | 0 refills | Status: DC
Start: 2023-03-23 — End: 2023-04-24

## 2023-03-23 MED ORDER — TACROLIMUS 0.1 % EX OINT
TOPICAL_OINTMENT | Freq: Two times a day (BID) | CUTANEOUS | 0 refills | Status: DC
Start: 2023-03-23 — End: 2023-03-23

## 2023-03-23 NOTE — Progress Notes (Signed)
Rx protopic to [facility] to use goodRx coupon.

## 2023-03-23 NOTE — Addendum Note (Signed)
Addended by: Althea Grimmer B on: 03/23/2023 01:06 PM   Modules accepted: Orders

## 2023-03-23 NOTE — Telephone Encounter (Signed)
Patient states cream is $200. Pharmacy advised good rx coupon would not work. PA required or changed to covered med, patient states pharmacist rattled off a whole list of things she would have to try before it would be covered.

## 2023-03-23 NOTE — Telephone Encounter (Signed)
Spoke with pharmacist. [place] coupon will work afterall and filling Rx for pt. Pt notified.

## 2023-03-28 ENCOUNTER — Telehealth: Payer: Self-pay

## 2023-03-28 NOTE — Telephone Encounter (Signed)
Pt calling; wants to know if Cream ABC rx'd for her could cause her ankles to be swollen.  [phone]

## 2023-03-28 NOTE — Telephone Encounter (Signed)
Doubt it but she can ask pharmacist

## 2023-03-29 NOTE — Telephone Encounter (Signed)
Pt aware.

## 2023-04-09 ENCOUNTER — Other Ambulatory Visit: Payer: Self-pay | Admitting: Family Medicine

## 2023-04-10 NOTE — Telephone Encounter (Signed)
Requested Prescriptions  Pending Prescriptions Disp Refills   lisinopril (ZESTRIL) 10 MG tablet [Pharmacy Med Name: Lisinopril 10 MG Oral Tablet] 100 tablet 1    Sig: TAKE 1 TABLET BY MOUTH DAILY     Cardiovascular:  ACE Inhibitors Failed - 04/09/2023  4:54 AM      Failed - Cr in normal range and within 180 days    Creatinine, Ser  Date Value Ref Range Status  03/01/2023 1.10 (H) 0.57 - 1.00 mg/dL Final         Failed - Last BP in normal range    BP Readings from Last 1 Encounters:  03/14/23 (!) 154/83         Passed - K in normal range and within 180 days    Potassium  Date Value Ref Range Status  03/01/2023 4.4 3.5 - 5.2 mmol/L Final         Passed - Patient is not pregnant      Passed - Valid encounter within last 6 months    Recent Outpatient Visits           1 month ago Benign hypertensive renal disease   [place] [facility] [employer], Megan P, DO   7 months ago Encounter for [employer] annual wellness exam   [place] [facility] [place], [place], DO   1 year ago Benign hypertensive renal disease   [place] [facility] [place], Megan P, DO   1 year ago Routine general medical examination at a health care facility   [facility] [place], [place] P, DO   2 years ago Hypertension, unspecified type   [place] [facility] [name], DO       Future Appointments             In 4 months Laural Benes, Oralia Rud, DO [place] [facility], [facility]

## 2023-04-13 ENCOUNTER — Other Ambulatory Visit: Payer: Self-pay | Admitting: Family Medicine

## 2023-04-13 NOTE — Telephone Encounter (Signed)
Requested Prescriptions  Pending Prescriptions Disp Refills   alendronate (FOSAMAX) 70 MG tablet [Pharmacy Med Name: Alendronate Sodium 70 MG Oral Tablet] 12 tablet 2    Sig: TAKE 1 TABLET BY MOUTH WEEKLY  WITH 8 OZ OF PLAIN WATER 30  MINUTES BEFORE FIRST FOOD, DRINK OR MEDS. STAY UPRIGHT FOR 30  MINS     Endocrinology:  Bisphosphonates Failed - 04/13/2023  4:52 AM      Failed - Vitamin D in normal range and within 360 days    Vit D, 25-Hydroxy  Date Value Ref Range Status  07/06/2020 40.7 30.0 - 100.0 ng/mL Final    Comment:    Vitamin D deficiency has been defined by the Institute of Medicine and an Endocrine Society practice guideline as a level of serum 25-OH vitamin D less than 20 ng/mL (1,2). The Endocrine Society went on to further define vitamin D insufficiency as a level between 21 and 29 ng/mL (2). 1. IOM (Institute of Medicine). 2010. Dietary reference    intakes for calcium and D. [place]: The    [employer]. 2. Holick MF, Binkley [place], Bischoff-Ferrari HA, et al.    Evaluation, treatment, and prevention of vitamin D    deficiency: an Endocrine Society clinical practice    guideline. JCEM. 2011 Jul; 96(7):1911-30.          Failed - Cr in normal range and within 360 days    Creatinine, Ser  Date Value Ref Range Status  03/01/2023 1.10 (H) 0.57 - 1.00 mg/dL Final         Failed - Mg Level in normal range and within 360 days    Magnesium  Date Value Ref Range Status  03/02/2015 2.0 1.6 - 2.3 mg/dL Final         Failed - Phosphate in normal range and within 360 days    No results found for: "PHOS"       Passed - Ca in normal range and within 360 days    Calcium  Date Value Ref Range Status  03/01/2023 9.1 8.7 - 10.3 mg/dL Final         Passed - eGFR is 30 or above and within 360 days    GFR calc Af Amer  Date Value Ref Range Status  07/06/2020 66 >59 mL/min/1.73 Final    Comment:    **In accordance with recommendations from the NKF-ASN Task  force,**   [facility] is in the process of updating its eGFR calculation to the   2021 CKD-EPI creatinine equation that estimates kidney function   without a race variable.    GFR calc non Af Amer  Date Value Ref Range Status  07/06/2020 57 (L) >59 mL/min/1.73 Final   eGFR  Date Value Ref Range Status  03/01/2023 54 (L) >59 mL/min/1.73 Final         Passed - Valid encounter within last 12 months    Recent Outpatient Visits           1 month ago Benign hypertensive renal disease   [facility] [place], Megan P, DO   7 months ago Encounter for [employer] annual wellness exam   [place] [facility] [place], [place] P, DO   1 year ago Benign hypertensive renal disease   [place] [facility] [name], DO   1 year ago Routine general medical examination at a health care facility   [facility], Megan P, DO  2 years ago Hypertension, unspecified type   [place] [facility] [place], Oralia Rud, DO       Future Appointments             In 4 months Laural Benes, Oralia Rud, DO [place] [facility]            Passed - Bone Mineral Density or Dexa Scan completed in the last 2 years

## 2023-04-20 NOTE — Progress Notes (Signed)
Jasmine Carrow, DO   Chief Complaint  Patient presents with   Follow-up    Pt says if she uses cream she's ok but when she doesn't use it vag itching comes back.     HPI:      Jasmine Church is a 72 y.o. G1P1001 whose LMP was No LMP recorded. Patient is postmenopausal., presents today for 4 wk f/u chronic vag itching. 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 Not sure if that affected result. 10/24 Pt given protopic BID but only using it once daily instead. Has noticed improvement in vaginal itching when using it. Stopped it for a couple days about 2 wks ago to see if could go off it but itching returned. Pt states she was diagnosed with LS in 2019 so unsure of bx results no the same as LS.   03/14/23 NOTE: presents today for NP eval severe vaginal itching. Has been going on for years, no bx ever done per pt. Treated with clobetasol cream initially with sx relief, no longer working. Was doing once wkly, PCP increased to 3 times weekly. Feels worse to pt with more itching. Has burning with urination. Did vag ERT a few yrs ago. No increased vag d/c. Not currently sexually active.    Patient Active Problem List   Diagnosis Date Noted   Advance directive discussed with patient 08/29/2022   Senile purpura (HCC) 07/08/2021   Aortic atherosclerosis (HCC) 09/01/2020   Mixed stress and urge urinary incontinence 11/01/2019   Osteoporosis 06/07/2018   CAD (coronary artery disease) 05/17/2018   COPD (chronic obstructive pulmonary disease) (HCC) 05/17/2018   Hiatal hernia 05/17/2018   Personal history of tobacco use, presenting hazards to health 05/14/2018   Lichen sclerosus et atrophicus 09/05/2017   Hx of smoking 03/02/2015   Leg cramps 03/02/2015   Vaginal atrophy    Benign hypertensive renal disease    Hyperlipidemia    CKD (chronic kidney disease) stage 3, GFR 30-59 ml/min (HCC)     Past Surgical History:  Procedure  Laterality Date   HEMORROIDECTOMY     HERNIA REPAIR      Family History  Problem Relation Age of Onset   Arthritis Mother    Hyperlipidemia Mother    Hypertension Mother    Heart disease Father    Hypertension Father    Heart attack Father    Cancer Sister        Uterine, unsure of age   Hypertension Sister    Heart disease Brother    Heart attack Brother    Hypertension Brother    Heart attack Brother    Hypertension Brother    Stroke Maternal Grandmother    Diabetes Neg Hx    Breast cancer Neg Hx     Social History   Socioeconomic History   Marital status: Married    Spouse name: Not on file   Number of children: Not on file   Years of education: Not on file   Highest education level: Not on file  Occupational History   Not on file  Tobacco Use   Smoking status: Former    Current packs/day: 0.00    Average packs/day: 1 pack/day for 33.0 years (33.0 ttl pk-yrs)    Types: Cigarettes    Start date: 06/07/1971    Quit date: 06/06/2004    Years since quitting: 18.8   Smokeless tobacco: Never  Vaping Use  Vaping status: Never Used  Substance and Sexual Activity   Alcohol use: No   Drug use: No   Sexual activity: Not Currently  Other Topics Concern   Not on file  Social History Narrative   Not on file   Social Determinants of Health   Financial Resource Strain: Low Risk  (08/29/2022)   Overall Financial Resource Strain (CARDIA)    Difficulty of Paying Living Expenses: Not hard at all  Food Insecurity: No Food Insecurity (08/29/2022)   Hunger Vital Sign    Worried About Running Out of Food in the Last Year: Never true    Ran Out of Food in the Last Year: Never true  Transportation Needs: No Transportation Needs (08/29/2022)   PRAPARE - Administrator, Civil Service (Medical): No    Lack of Transportation (Non-Medical): No  Physical Activity: Sufficiently Active (08/29/2022)   Exercise Vital Sign    Days of Exercise per Week: 7 days    Minutes of  Exercise per Session: 30 min  Stress: No Stress Concern Present (08/29/2022)   [employer] of Occupational Health - Occupational Stress Questionnaire    Feeling of Stress : Not at all  Social Connections: Moderately Isolated (08/29/2022)   Social Connection and Isolation Panel [NHANES]    Frequency of Communication with Friends and Family: Once a week    Frequency of Social Gatherings with Friends and Family: Once a week    Attends Religious Services: More than 4 times per year    Active Member of [employer] or Organizations: No    Attends Banker Meetings: Never    Marital Status: Married  Catering manager Violence: Not At Risk (08/29/2022)   Humiliation, Afraid, Rape, and Kick questionnaire    Fear of Current or Ex-Partner: No    Emotionally Abused: No    Physically Abused: No    Sexually Abused: No    Outpatient Medications Prior to Visit  Medication Sig Dispense Refill   alendronate (FOSAMAX) 70 MG tablet TAKE 1 TABLET BY MOUTH WEEKLY  WITH 8 OZ OF PLAIN WATER 30  MINUTES BEFORE FIRST FOOD, DRINK OR MEDS. STAY UPRIGHT FOR 30  MINS 12 tablet 2   aspirin EC 81 MG tablet Take 81 mg by mouth daily.     atorvastatin (LIPITOR) 20 MG tablet TAKE 1 TABLET BY MOUTH AT  BEDTIME 100 tablet 0   Calcium Carbonate-Vitamin D 500-125 MG-UNIT TABS Take by mouth daily. Patient takes it only 3 days a week.     clobetasol cream (TEMOVATE) 0.05 % Apply 1 Application topically 3 (three) times a week. 60 g 1   furosemide (LASIX) 20 MG tablet Take 1 tablet (20 mg total) by mouth daily. 90 tablet 1   lisinopril (ZESTRIL) 10 MG tablet TAKE 1 TABLET BY MOUTH DAILY 100 tablet 1   omeprazole (PRILOSEC) 20 MG capsule TAKE 1 CAPSULE BY MOUTH DAILY 90 capsule 3   polyethylene glycol powder (GLYCOLAX/MIRALAX) powder Take 17 g by mouth 3 (three) times daily as needed. 3350 g 1   tacrolimus (PROTOPIC) 0.1 % ointment Apply topically 2 (two) times daily. For 4 wks 30 g 0   No facility-administered  medications prior to visit.      ROS:  Review of Systems  Constitutional:  Negative for fever.  Gastrointestinal:  Negative for blood in stool, constipation, diarrhea, nausea and vomiting.  Genitourinary:  Negative for dyspareunia, dysuria, flank pain, frequency, hematuria, urgency, vaginal bleeding, vaginal discharge and vaginal pain.  Musculoskeletal:  Negative for back pain.  Skin:  Negative for rash.   BREAST: No symptoms   OBJECTIVE:   Vitals:  BP 128/80   Ht 5\' 3"  (1.6 m)   Wt 155 lb (70.3 kg)   BMI 27.46 kg/m   Physical Exam Vitals reviewed.  Constitutional:      Appearance: She is well-developed.  Pulmonary:     Effort: Pulmonary effort is normal.  Genitourinary:    Labia:        Right: Rash present.        Left: Rash present.      Comments: VULVA SIGNIFICANTLY IMPROVED ON EXAM; HYPERTROPHIED AREAS NEAR CLITORIS AS WELL AS LABIA MINORA MUCH IMPROVED, NO FISSURES; NO EXCORIATIONS Musculoskeletal:        General: Normal range of motion.     Cervical back: Normal range of motion.  Skin:    General: Skin is warm and dry.  Neurological:     General: No focal deficit present.     Mental Status: She is alert and oriented to person, place, and time.     Cranial Nerves: No cranial nerve deficit.  Psychiatric:        Mood and Affect: Mood normal.        Behavior: Behavior normal.        Thought Content: Thought content normal.        Judgment: Judgment normal.     Assessment/Plan: Lichen sclerosus - Plan: tacrolimus (PROTOPIC) 0.1 % ointment; sx much improved with protopic (failed clobetasol). Was using daily instead of BID for 4 wks. Cont daily for 4 wks, then twice wkly for 4 wks, then f/u prn sx. Rx RF eRxd. Discussed importance of treating for 3 months per recommendations. Sx may then flare periodically and need to be treated; pt aware no cure. Discussed LS vs pathology result on bx--could be location of bx vs steroid use.   Meds ordered this encounter   Medications   tacrolimus (PROTOPIC) 0.1 % ointment    Sig: Apply topically daily. For 4 wks then twice weekly for 4 weeks then stop    Dispense:  30 g    Refill:  1    PLEASE USE [facility] COUPON IF INSURANCE >$35.    Order Specific Question:   Supervising Provider    Answer:   Waymon Budge     Return if symptoms worsen or fail to improve.  Verdell Dykman B. Ramonia Mcclaran, PA-C 04/24/2023 12:06 PM

## 2023-04-24 ENCOUNTER — Other Ambulatory Visit: Payer: Self-pay | Admitting: Obstetrics and Gynecology

## 2023-04-24 ENCOUNTER — Encounter: Payer: Self-pay | Admitting: Obstetrics and Gynecology

## 2023-04-24 ENCOUNTER — Ambulatory Visit: Payer: Medicare Other | Admitting: Obstetrics and Gynecology

## 2023-04-24 VITALS — BP 128/80 | Ht 63.0 in | Wt 155.0 lb

## 2023-04-24 DIAGNOSIS — L900 Lichen sclerosus et atrophicus: Secondary | ICD-10-CM

## 2023-04-24 MED ORDER — TACROLIMUS 0.1 % EX OINT
TOPICAL_OINTMENT | Freq: Every day | CUTANEOUS | 1 refills | Status: DC
Start: 2023-04-24 — End: 2023-08-30

## 2023-04-24 NOTE — Patient Instructions (Signed)
I value your feedback and you entrusting us with your care. If you get a [facility] patient survey, I would appreciate you taking the time to let us know about your experience today. Thank you! ? ? ?

## 2023-04-24 NOTE — Telephone Encounter (Signed)
Yes good Rx and she failed clobetasol. This is also a refill so has been on it a month already. Thx.

## 2023-04-25 ENCOUNTER — Other Ambulatory Visit: Payer: Self-pay | Admitting: Obstetrics and Gynecology

## 2023-04-25 DIAGNOSIS — L900 Lichen sclerosus et atrophicus: Secondary | ICD-10-CM

## 2023-05-01 ENCOUNTER — Other Ambulatory Visit: Payer: Self-pay | Admitting: Obstetrics and Gynecology

## 2023-05-01 DIAGNOSIS — L900 Lichen sclerosus et atrophicus: Secondary | ICD-10-CM

## 2023-05-12 ENCOUNTER — Other Ambulatory Visit: Payer: Self-pay | Admitting: Family Medicine

## 2023-05-15 NOTE — Telephone Encounter (Signed)
Requested Prescriptions  Pending Prescriptions Disp Refills   atorvastatin (LIPITOR) 20 MG tablet [Pharmacy Med Name: Atorvastatin Calcium 20 MG Oral Tablet] 100 tablet 2    Sig: TAKE 1 TABLET BY MOUTH AT  BEDTIME     Cardiovascular:  Antilipid - Statins Failed - 05/12/2023  4:48 AM      Failed - Lipid Panel in normal range within the last 12 months    Cholesterol, Total  Date Value Ref Range Status  03/01/2023 190 100 - 199 mg/dL Final   LDL Chol Calc (NIH)  Date Value Ref Range Status  03/01/2023 112 (H) 0 - 99 mg/dL Final   HDL  Date Value Ref Range Status  03/01/2023 57 >39 mg/dL Final   Triglycerides  Date Value Ref Range Status  03/01/2023 119 0 - 149 mg/dL Final         Passed - Patient is not pregnant      Passed - Valid encounter within last 12 months    Recent Outpatient Visits           2 months ago Benign hypertensive renal disease   [facility] [facility] [place], Megan P, DO   8 months ago Encounter for [employer] annual wellness exam   [facility] [place], [place], DO   1 year ago Benign hypertensive renal disease   [place], Megan P, DO   1 year ago Routine general medical examination at a health care facility   [facility] [place], [place] P, DO   2 years ago Hypertension, unspecified type   [facility] [name], DO       Future Appointments             In 3 months Laural Benes, Oralia Rud, DO [facility], [facility]

## 2023-07-19 ENCOUNTER — Encounter: Payer: Self-pay | Admitting: Family Medicine

## 2023-07-19 ENCOUNTER — Ambulatory Visit (INDEPENDENT_AMBULATORY_CARE_PROVIDER_SITE_OTHER): Payer: Medicare Other | Admitting: Family Medicine

## 2023-07-19 VITALS — BP 163/104 | HR 80 | Temp 99.1°F | Resp 16 | Ht 62.99 in | Wt 155.0 lb

## 2023-07-19 DIAGNOSIS — L900 Lichen sclerosus et atrophicus: Secondary | ICD-10-CM | POA: Diagnosis not present

## 2023-07-19 DIAGNOSIS — R300 Dysuria: Secondary | ICD-10-CM | POA: Diagnosis not present

## 2023-07-19 DIAGNOSIS — N3000 Acute cystitis without hematuria: Secondary | ICD-10-CM

## 2023-07-19 LAB — URINALYSIS, ROUTINE W REFLEX MICROSCOPIC
Bilirubin, UA: NEGATIVE
Glucose, UA: NEGATIVE
Ketones, UA: NEGATIVE
Nitrite, UA: NEGATIVE
Protein,UA: NEGATIVE
Specific Gravity, UA: 1.02 (ref 1.005–1.030)
Urobilinogen, Ur: 0.2 mg/dL (ref 0.2–1.0)
pH, UA: 6 (ref 5.0–7.5)

## 2023-07-19 LAB — MICROSCOPIC EXAMINATION

## 2023-07-19 MED ORDER — SULFAMETHOXAZOLE-TRIMETHOPRIM 800-160 MG PO TABS: 1.0000 | ORAL_TABLET | Freq: Two times a day (BID) | ORAL | 0 refills | Status: DC

## 2023-07-19 NOTE — Progress Notes (Signed)
BP (!) 163/104 (BP Location: Left Arm, Patient Position: Sitting, Cuff Size: Normal)   Pulse 80   Temp 99.1 F (37.3 C) (Oral)   Resp 16   Ht 5' 2.99" (1.6 m)   Wt 154 lb 15.7 oz (70.3 kg)   SpO2 96%   BMI 27.46 kg/m    Subjective:    Patient ID: Jasmine Church, female    DOB: [birth year], 73 y.o.   MRN: [id number]  HPI: Jasmine Church is a 72 y.o. female  Chief Complaint  Patient presents with   Urinary Tract Infection    Burning, painful, urgency always having to use restroom   Unhappy with her GYN- she doesn't like what she's hearing about the lichen sclerosis so she hasn't called them. She started with increased symptoms again as soon as she stopped the tacrolimus. She was told previously that if she didn't get better, she'd have to see dermatology. She doesn't want to go back to GYN but would like to see dermatology. She has been having so much itching and irritation.   URINARY SYMPTOMS Duration: 2 weeks Dysuria: yes Urinary frequency: yes Urgency: yes Small volume voids: no Symptom severity: severe Urinary incontinence: yes Foul odor: yes Hematuria: no Abdominal pain: no Back pain: no Suprapubic pain/pressure: no Flank pain: no Fever:  no Vomiting: no Relief with cranberry juice: no Relief with pyridium: no Status: worse Previous urinary tract infection: no Recurrent urinary tract infection: no History of sexually transmitted disease: no Vaginal discharge: no Treatments attempted: increasing fluids   Relevant past medical, surgical, family and social history reviewed and updated as indicated. Interim medical history since our last visit reviewed. Allergies and medications reviewed and updated.  Review of Systems  Constitutional: Negative.   Respiratory: Negative.    Cardiovascular: Negative.   Genitourinary:  Positive for dysuria, frequency, hematuria, urgency, vaginal discharge and vaginal pain. Negative for decreased urine volume, difficulty  urinating, dyspareunia, enuresis, flank pain, genital sores, menstrual problem, pelvic pain and vaginal bleeding.  Skin: Negative.   Neurological: Negative.   Psychiatric/Behavioral: Negative.      Per HPI unless specifically indicated above     Objective:    BP (!) 163/104 (BP Location: Left Arm, Patient Position: Sitting, Cuff Size: Normal)   Pulse 80   Temp 99.1 F (37.3 C) (Oral)   Resp 16   Ht 5' 2.99" (1.6 m)   Wt 154 lb 15.7 oz (70.3 kg)   SpO2 96%   BMI 27.46 kg/m   Wt Readings from Last 3 Encounters:  07/19/23 154 lb 15.7 oz (70.3 kg)  04/24/23 155 lb (70.3 kg)  03/14/23 156 lb (70.8 kg)    Physical Exam Vitals and nursing note reviewed.  Constitutional:      General: She is not in acute distress.    Appearance: Normal appearance. She is not ill-appearing, toxic-appearing or diaphoretic.  HENT:     Head: Normocephalic and atraumatic.     Right Ear: External ear normal.     Left Ear: External ear normal.     Nose: Nose normal.     Mouth/Throat:     Mouth: Mucous membranes are moist.     Pharynx: Oropharynx is clear.  Eyes:     General: No scleral icterus.       Right eye: No discharge.        Left eye: No discharge.     Extraocular Movements: Extraocular movements intact.     Conjunctiva/sclera: Conjunctivae normal.  Pupils: Pupils are equal, round, and reactive to light.  Cardiovascular:     Rate and Rhythm: Normal rate and regular rhythm.     Pulses: Normal pulses.     Heart sounds: Normal heart sounds. No murmur heard.    No friction rub. No gallop.  Pulmonary:     Effort: Pulmonary effort is normal. No respiratory distress.     Breath sounds: Normal breath sounds. No stridor. No wheezing, rhonchi or rales.  Chest:     Chest wall: No tenderness.  Musculoskeletal:        General: Normal range of motion.     Cervical back: Normal range of motion and neck supple.  Skin:    General: Skin is warm and dry.     Capillary Refill: Capillary refill  takes less than 2 seconds.     Coloration: Skin is not jaundiced or pale.     Findings: No bruising, erythema, lesion or rash.  Neurological:     General: No focal deficit present.     Mental Status: She is alert and oriented to person, place, and time. Mental status is at baseline.  Psychiatric:        Mood and Affect: Mood normal.        Behavior: Behavior normal.        Thought Content: Thought content normal.        Judgment: Judgment normal.     Results for orders placed or performed in visit on 03/14/23  Surgical pathology   Collection Time: 03/14/23  2:35 PM  Result Value Ref Range   SURGICAL PATHOLOGY      SURGICAL PATHOLOGY CASE: [id number] PATIENT: Kena Fitzpatrick Surgical Pathology Report     Clinical History: vulvar itching, LS (cm)     FINAL MICROSCOPIC DIAGNOSIS:  A.   LABIA, BIOPSY: LICHENOID DERMATITIS  MICROSCOPIC DESCRIPTION: There is a superficial perivascular and patchy lichenoid infiltrate of lymphocytes associated with focal vacuolar alteration at the dermoepidermal junction.  A few lymphocytes extend to the epidermis where there is some spongiosis and focal parakeratosis. A PAS stain is negative for fungi.  These findings are those of a lichenoid and spongiotic reaction. With this mixed pattern, a drug reaction is in the differential diagnosis. Other possibilities include a chronic contact or nummular dermatitis  GROSS DESCRIPTION:  Specimen is received in formalin consists of a 0.3 cm piece of tan-white soft tissue.  Specimen is entirely submitted in 1 cassette.  Lovey Newcomer 03/15/2023)  Final Diagnosis performed by Darryll Capers, MD.   Karren Cobble ronically signed 03/21/2023 Technical component performed at [employer]. [facility], [address], [place], [place] [address].  Professional component performed at [facility]. [address], [address], [place], [place] [address].  Immunohistochemistry Technical component  (if applicable) was performed at [facility]. [address], [place], [place] [address].   IMMUNOHISTOCHEMISTRY DISCLAIMER (if applicable): Some of these immunohistochemical stains may have been developed and the performance characteristics determine by [facility]. Some may not have been cleared or approved by the U.S. Food and Drug Administration. The FDA has determined that such clearance or approval is not necessary. This test is used for clinical purposes. It should not be regarded as investigational or for research. This laboratory is certified under the Clinical Laboratory Improvement Amendments of 1988 (CLIA-88) as qualified t o perform high complexity clinical laboratory testing.  The controls stained appropriately.   IHC stains are performed on formalin fixed, paraffin embedded  tissue using a 3,3"diaminobenzidine (DAB) chromogen and Leica Bond Autostainer System. The staining intensity of the nucleus is score manually and is reported as the percentage of tumor cell nuclei demonstrating specific nuclear staining. The specimens are fixed in 10% Neutral Formalin for at least 6 hours and up to 72hrs. These tests are validated on decalcified tissue. Results should be interpreted with caution given the possibility of false negative results on decalcified specimens. Antibody Clones are as follows ER-clone 25F, PR-clone 16, Ki67- clone MM1. Some of these immunohistochemical stains may have been developed and the performance characteristics determined by [facility].       Assessment & Plan:   Problem List Items Addressed This Visit       Other   Lichen sclerosus et atrophicus   Not significantly better- starting to get worse. Will get her into dermatology as she states that's next step per GYN. Call with any concerns. Continue to monitor.       Relevant Orders   Ambulatory referral to Dermatology   Other Visit Diagnoses        Acute cystitis without hematuria    -  Primary   Will send for culture and treat empirically with bactrim. Call with any concerns or if not getting better.     Dysuria       + leuks   Relevant Orders   Urinalysis, Routine w reflex microscopic   Urine Culture        Follow up plan: Return for As scheduled.

## 2023-07-19 NOTE — Assessment & Plan Note (Signed)
Not significantly better- starting to get worse. Will get her into dermatology as she states that's next step per GYN. Call with any concerns. Continue to monitor.

## 2023-07-21 LAB — URINE CULTURE

## 2023-08-01 ENCOUNTER — Other Ambulatory Visit: Payer: Self-pay | Admitting: Family Medicine

## 2023-08-02 NOTE — Telephone Encounter (Signed)
 Requested Prescriptions  Pending Prescriptions Disp Refills   omeprazole (PRILOSEC) 20 MG capsule [Pharmacy Med Name: Omeprazole 20 MG Oral Capsule Delayed Release] 100 capsule 0    Sig: TAKE 1 CAPSULE BY MOUTH DAILY     Gastroenterology: Proton Pump Inhibitors Passed - 08/02/2023  2:20 PM      Passed - Valid encounter within last 12 months    Recent Outpatient Visits           5 months ago Benign hypertensive renal disease   [place] [facility] [place], Megan P, DO   11 months ago Encounter for [employer] annual wellness exam   [place] [facility] [place], [place] P, DO   1 year ago Benign hypertensive renal disease   [place] [facility] [place], Megan P, DO   2 years ago Routine general medical examination at a health care facility   [facility] [place], [place] P, DO   2 years ago Hypertension, unspecified type   [facility] [name], DO       Future Appointments             In 4 weeks Laural Benes, Oralia Rud, DO [facility], [facility]

## 2023-08-03 DIAGNOSIS — L900 Lichen sclerosus et atrophicus: Secondary | ICD-10-CM | POA: Diagnosis not present

## 2023-08-30 ENCOUNTER — Encounter: Payer: Self-pay | Admitting: Family Medicine

## 2023-08-30 ENCOUNTER — Ambulatory Visit (INDEPENDENT_AMBULATORY_CARE_PROVIDER_SITE_OTHER): Payer: Medicare Other | Admitting: Family Medicine

## 2023-08-30 VITALS — BP 148/86 | HR 73 | Temp 98.7°F | Resp 16 | Ht 61.61 in | Wt 159.6 lb

## 2023-08-30 DIAGNOSIS — E782 Mixed hyperlipidemia: Secondary | ICD-10-CM | POA: Diagnosis not present

## 2023-08-30 DIAGNOSIS — Z0000 Encounter for general adult medical examination without abnormal findings: Secondary | ICD-10-CM

## 2023-08-30 DIAGNOSIS — D692 Other nonthrombocytopenic purpura: Secondary | ICD-10-CM | POA: Diagnosis not present

## 2023-08-30 DIAGNOSIS — Z87440 Personal history of urinary (tract) infections: Secondary | ICD-10-CM | POA: Diagnosis not present

## 2023-08-30 DIAGNOSIS — I129 Hypertensive chronic kidney disease with stage 1 through stage 4 chronic kidney disease, or unspecified chronic kidney disease: Secondary | ICD-10-CM | POA: Diagnosis not present

## 2023-08-30 LAB — URINALYSIS, ROUTINE W REFLEX MICROSCOPIC
Bilirubin, UA: NEGATIVE
Glucose, UA: NEGATIVE
Ketones, UA: NEGATIVE
Leukocytes,UA: NEGATIVE
Nitrite, UA: NEGATIVE
Protein,UA: NEGATIVE
Specific Gravity, UA: 1.01 (ref 1.005–1.030)
Urobilinogen, Ur: 0.2 mg/dL (ref 0.2–1.0)
pH, UA: 5.5 (ref 5.0–7.5)

## 2023-08-30 LAB — MICROSCOPIC EXAMINATION
Bacteria, UA: NONE SEEN
WBC, UA: NONE SEEN /HPF (ref 0–5)

## 2023-08-30 LAB — MICROALBUMIN, URINE WAIVED
Creatinine, Urine Waived: 50 mg/dL (ref 10–300)
Microalb, Ur Waived: 10 mg/L (ref 0–19)

## 2023-08-30 MED ORDER — OMEPRAZOLE 20 MG PO CPDR: 20.0000 mg | DELAYED_RELEASE_CAPSULE | Freq: Every day | ORAL | 1 refills | Status: DC

## 2023-08-30 MED ORDER — LISINOPRIL 20 MG PO TABS: 20.0000 mg | ORAL_TABLET | Freq: Every day | ORAL | 0 refills | Status: DC

## 2023-08-30 MED ORDER — ATORVASTATIN CALCIUM 20 MG PO TABS: 20.0000 mg | ORAL_TABLET | Freq: Every day | ORAL | 2 refills | Status: DC

## 2023-08-30 NOTE — Progress Notes (Signed)
 BP (!) 148/86   Pulse 73   Temp 98.7 F (37.1 C) (Oral)   Resp 16   Ht 5' 1.61" (1.565 m)   Wt 159 lb 9.6 oz (72.4 kg)   SpO2 97%   BMI 29.56 kg/m    Subjective:    Patient ID: Jasmine Church, female    DOB: 26-Sep-1950, 73 y.o.   MRN: [id number]  HPI: Jasmine Church is a 73 y.o. female presenting on 08/30/2023 for comprehensive medical examination. Current medical complaints include:  HYPERTENSION / HYPERLIPIDEMIA Satisfied with current treatment? yes Duration of hypertension: chronic BP monitoring frequency: not checking BP medication side effects: no Past BP meds: lasix, lisinopril Duration of hyperlipidemia: chronic Cholesterol medication side effects: no Cholesterol supplements: none Past cholesterol medications: atorvastatin Medication compliance: excellent compliance Aspirin: yes Recent stressors: no Recurrent headaches: no Visual changes: no Palpitations: no Dyspnea: no Chest pain: no Lower extremity edema: no Dizzy/lightheaded: no  She currently lives with: husband Menopausal Symptoms: yes  Depression Screen done today and results listed below:     08/30/2023    8:24 AM 07/19/2023    2:19 PM 03/01/2023    8:25 AM 08/29/2022    9:49 AM 08/29/2022    9:00 AM  Depression screen PHQ 2/9  Decreased Interest 0 0 0 0 0  Down, Depressed, Hopeless 0 0 0 0 0  PHQ - 2 Score 0 0 0 0 0  Altered sleeping 0 0 0 0 0  Tired, decreased energy 0 0 0 0 0  Change in appetite 0 0 0 0 0  Feeling bad or failure about yourself  0 0 0 0 0  Trouble concentrating 0 0 0 0 0  Moving slowly or fidgety/restless 0 0 0 0 0  Suicidal thoughts 0 0 0 0 0  PHQ-9 Score 0 0 0 0 0  Difficult doing work/chores Not difficult at all Not difficult at all Not difficult at all Not difficult at all Not difficult at all    Past Medical History:  Past Medical History:  Diagnosis Date   Arthritis    CKD (chronic kidney disease) stage 3, GFR 30-59 ml/min (HCC)    Hyperlipidemia     Hypertension    Vaginal atrophy     Surgical History:  Past Surgical History:  Procedure Laterality Date   HEMORROIDECTOMY     HERNIA REPAIR      Medications:  Current Outpatient Medications on File Prior to Visit  Medication Sig   alendronate (FOSAMAX) 70 MG tablet TAKE 1 TABLET BY MOUTH WEEKLY  WITH 8 OZ OF PLAIN WATER 30  MINUTES BEFORE FIRST FOOD, DRINK OR MEDS. STAY UPRIGHT FOR 30  MINS   aspirin EC 81 MG tablet Take 81 mg by mouth daily.   Calcium Carbonate-Vitamin D 500-125 MG-UNIT TABS Take by mouth daily. Patient takes it only 3 days a week.   clobetasol ointment (TEMOVATE) 0.05 % Apply 1 Application topically 2 (two) times daily.   furosemide (LASIX) 20 MG tablet Take 1 tablet (20 mg total) by mouth daily.   polyethylene glycol powder (GLYCOLAX/MIRALAX) powder Take 17 g by mouth 3 (three) times daily as needed.   No current facility-administered medications on file prior to visit.    Allergies:  No Known Allergies  Social History:  Social History   Socioeconomic History   Marital status: Married    Spouse name: Not on file   Number of children: Not on file   Years of education: Not  on file   Highest education level: Not on file  Occupational History   Not on file  Tobacco Use   Smoking status: Former    Current packs/day: 0.00    Average packs/day: 1 pack/day for 33.0 years (33.0 ttl pk-yrs)    Types: Cigarettes    Start date: 06/07/1971    Quit date: 06/06/2004    Years since quitting: 19.2   Smokeless tobacco: Never  Vaping Use   Vaping status: Never Used  Substance and Sexual Activity   Alcohol use: No   Drug use: No   Sexual activity: Not Currently  Other Topics Concern   Not on file  Social History Narrative   Not on file   Social Drivers of Health   Financial Resource Strain: Low Risk  (08/29/2022)   Overall Financial Resource Strain (CARDIA)    Difficulty of Paying Living Expenses: Not hard at all  Food Insecurity: No Food Insecurity  (08/29/2022)   Hunger Vital Sign    Worried About Running Out of Food in the Last Year: Never true    Ran Out of Food in the Last Year: Never true  Transportation Needs: No Transportation Needs (08/29/2022)   PRAPARE - Administrator, Civil Service (Medical): No    Lack of Transportation (Non-Medical): No  Physical Activity: Sufficiently Active (08/29/2022)   Exercise Vital Sign    Days of Exercise per Week: 7 days    Minutes of Exercise per Session: 30 min  Stress: No Stress Concern Present (08/29/2022)   [employer] of Occupational Health - Occupational Stress Questionnaire    Feeling of Stress : Not at all  Social Connections: Moderately Isolated (08/29/2022)   Social Connection and Isolation Panel [NHANES]    Frequency of Communication with Friends and Family: Once a week    Frequency of Social Gatherings with Friends and Family: Once a week    Attends Religious Services: More than 4 times per year    Active Member of [employer] or Organizations: No    Attends Banker Meetings: Never    Marital Status: Married  Catering manager Violence: Not At Risk (08/29/2022)   Humiliation, Afraid, Rape, and Kick questionnaire    Fear of Current or Ex-Partner: No    Emotionally Abused: No    Physically Abused: No    Sexually Abused: No   Social History   Tobacco Use  Smoking Status Former   Current packs/day: 0.00   Average packs/day: 1 pack/day for 33.0 years (33.0 ttl pk-yrs)   Types: Cigarettes   Start date: 06/07/1971   Quit date: 06/06/2004   Years since quitting: 19.2  Smokeless Tobacco Never   Social History   Substance and Sexual Activity  Alcohol Use No    Family History:  Family History  Problem Relation Age of Onset   Arthritis Mother    Hyperlipidemia Mother    Hypertension Mother    Heart disease Father    Hypertension Father    Heart attack Father    Cancer Sister        Uterine, unsure of age   Hypertension Sister    Heart disease  Brother    Heart attack Brother    Hypertension Brother    Heart attack Brother    Hypertension Brother    Stroke Maternal Grandmother    Diabetes Neg Hx    Breast cancer Neg Hx     Past medical history, surgical history, medications, allergies, family history and social  history reviewed with patient today and changes made to appropriate areas of the chart.   Review of Systems  Constitutional: Negative.   HENT:  Positive for ear pain. Negative for congestion, ear discharge, hearing loss, nosebleeds, sinus pain, sore throat and tinnitus.   Eyes: Negative.   Respiratory: Negative.  Negative for stridor.   Cardiovascular: Negative.   Gastrointestinal:  Positive for heartburn. Negative for abdominal pain, blood in stool, constipation, diarrhea, melena, nausea and vomiting.  Genitourinary: Negative.   Musculoskeletal: Negative.   Skin: Negative.   Neurological: Negative.   Endo/Heme/Allergies:  Negative for environmental allergies and polydipsia. Bruises/bleeds easily.  Psychiatric/Behavioral: Negative.     All other ROS negative except what is listed above and in the HPI.      Objective:    BP (!) 148/86   Pulse 73   Temp 98.7 F (37.1 C) (Oral)   Resp 16   Ht 5' 1.61" (1.565 m)   Wt 159 lb 9.6 oz (72.4 kg)   SpO2 97%   BMI 29.56 kg/m   Wt Readings from Last 3 Encounters:  08/30/23 159 lb 9.6 oz (72.4 kg)  07/19/23 154 lb 15.7 oz (70.3 kg)  04/24/23 155 lb (70.3 kg)    Physical Exam Vitals and nursing note reviewed.  Constitutional:      General: She is not in acute distress.    Appearance: Normal appearance. She is not ill-appearing, toxic-appearing or diaphoretic.  HENT:     Head: Normocephalic and atraumatic.     Right Ear: Tympanic membrane, ear canal and external ear normal. There is no impacted cerumen.     Left Ear: Tympanic membrane, ear canal and external ear normal. There is no impacted cerumen.     Nose: Nose normal. No congestion or rhinorrhea.      Mouth/Throat:     Mouth: Mucous membranes are moist.     Pharynx: Oropharynx is clear. No oropharyngeal exudate or posterior oropharyngeal erythema.  Eyes:     General: No scleral icterus.       Right eye: No discharge.        Left eye: No discharge.     Extraocular Movements: Extraocular movements intact.     Conjunctiva/sclera: Conjunctivae normal.     Pupils: Pupils are equal, round, and reactive to light.  Neck:     Vascular: No carotid bruit.  Cardiovascular:     Rate and Rhythm: Normal rate and regular rhythm.     Pulses: Normal pulses.     Heart sounds: No murmur heard.    No friction rub. No gallop.  Pulmonary:     Effort: Pulmonary effort is normal. No respiratory distress.     Breath sounds: Normal breath sounds. No stridor. No wheezing, rhonchi or rales.  Chest:     Chest wall: No tenderness.  Abdominal:     General: Abdomen is flat. Bowel sounds are normal. There is no distension.     Palpations: Abdomen is soft. There is no mass.     Tenderness: There is no abdominal tenderness. There is no right CVA tenderness, left CVA tenderness, guarding or rebound.     Hernia: No hernia is present.  Genitourinary:    Comments: Breast and pelvic exams deferred with shared decision making Musculoskeletal:        General: No swelling, tenderness, deformity or signs of injury.     Cervical back: Normal range of motion and neck supple. No rigidity. No muscular tenderness.     Right  lower leg: No edema.     Left lower leg: No edema.  Lymphadenopathy:     Cervical: No cervical adenopathy.  Skin:    General: Skin is warm and dry.     Capillary Refill: Capillary refill takes less than 2 seconds.     Coloration: Skin is not jaundiced or pale.     Findings: No bruising, erythema, lesion or rash.  Neurological:     General: No focal deficit present.     Mental Status: She is alert and oriented to person, place, and time. Mental status is at baseline.     Cranial Nerves: No cranial  nerve deficit.     Sensory: No sensory deficit.     Motor: No weakness.     Coordination: Coordination normal.     Gait: Gait normal.     Deep Tendon Reflexes: Reflexes normal.  Psychiatric:        Mood and Affect: Mood normal.        Behavior: Behavior normal.        Thought Content: Thought content normal.        Judgment: Judgment normal.     Results for orders placed or performed in visit on 07/19/23  Urine Culture   Collection Time: 07/19/23  1:32 PM   Specimen: Urine   UR  Result Value Ref Range   Urine Culture, Routine Final report    Organism ID, Bacteria Comment   Microscopic Examination   Collection Time: 07/19/23  1:32 PM   Urine  Result Value Ref Range   WBC, UA 0-5 0 - 5 /hpf   RBC, Urine 3-10 (A) 0 - 2 /hpf   Epithelial Cells (non renal) 0-10 0 - 10 /hpf   Bacteria, UA Few (A) None seen/Few  Urinalysis, Routine w reflex microscopic   Collection Time: 07/19/23  1:32 PM  Result Value Ref Range   Specific Gravity, UA 1.020 1.005 - 1.030   pH, UA 6.0 5.0 - 7.5   Color, UA Yellow Yellow   Appearance Ur Clear Clear   Leukocytes,UA Trace (A) Negative   Protein,UA Negative Negative/Trace   Glucose, UA Negative Negative   Ketones, UA Negative Negative   RBC, UA 2+ (A) Negative   Bilirubin, UA Negative Negative   Urobilinogen, Ur 0.2 0.2 - 1.0 mg/dL   Nitrite, UA Negative Negative   Microscopic Examination See below:       Assessment & Plan:   Problem List Items Addressed This Visit       Cardiovascular and Mediastinum   Senile purpura (HCC)   Reassured patient. Continue to monitor.       Relevant Medications   atorvastatin (LIPITOR) 20 MG tablet   lisinopril (ZESTRIL) 20 MG tablet   Other Relevant Orders   CBC with Differential/Platelet   Comprehensive metabolic panel     Genitourinary   Benign hypertensive renal disease   BP running high. Will increase her lisinopril to 20mg  and recheck in 1-2 months. Call with any concerns.       Relevant  Orders   CBC with Differential/Platelet   Comprehensive metabolic panel   TSH   Microalbumin, Urine Waived     Other   Hyperlipidemia   Under good control on current regimen. Continue current regimen. Continue to monitor. Call with any concerns. Refills given. Labs drawn today.       Relevant Medications   atorvastatin (LIPITOR) 20 MG tablet   lisinopril (ZESTRIL) 20 MG tablet   Other Relevant  Orders   CBC with Differential/Platelet   Comprehensive metabolic panel   Lipid Panel w/o Chol/HDL Ratio   Other Visit Diagnoses       Routine general medical examination at a health care facility    -  Primary   Vaccines up to date. Screening labs checked today. Mammo, DEXA and colonoscopy up to date. Continue diet and exercise. Call with any concerns.     History of UTI       Wil check urine at patient's request- no symptoms. Call with any concerns.   Relevant Orders   Urinalysis, Routine w reflex microscopic        Follow up plan: Return in about 2 months (around 10/30/2023).   LABORATORY TESTING:  - Pap smear: not applicable  IMMUNIZATIONS:   - Tdap: Tetanus vaccination status reviewed: last tetanus booster within 10 years. - Influenza: Up to date - Pneumovax: Up to date - Prevnar: Up to date - COVID: Refused - HPV: Not applicable - Shingrix vaccine: Refused  SCREENING: -Mammogram: Up to date  - Colonoscopy: Up to date  - Bone Density: Up to date   PATIENT COUNSELING:   Advised to take 1 mg of folate supplement per day if capable of pregnancy.   Sexuality: Discussed sexually transmitted diseases, partner selection, use of condoms, avoidance of unintended pregnancy  and contraceptive alternatives.   Advised to avoid cigarette smoking.  I discussed with the patient that most people either abstain from alcohol or drink within safe limits (<=14/week and <=4 drinks/occasion for males, <=7/weeks and <= 3 drinks/occasion for females) and that the risk for alcohol  disorders and other health effects rises proportionally with the number of drinks per week and how often a drinker exceeds daily limits.  Discussed cessation/primary prevention of drug use and availability of treatment for abuse.   Diet: Encouraged to adjust caloric intake to maintain  or achieve ideal body weight, to reduce intake of dietary saturated fat and total fat, to limit sodium intake by avoiding high sodium foods and not adding table salt, and to maintain adequate dietary potassium and calcium preferably from fresh fruits, vegetables, and low-fat dairy products.    stressed the importance of regular exercise  Injury prevention: Discussed safety belts, safety helmets, smoke detector, smoking near bedding or upholstery.   Dental health: Discussed importance of regular tooth brushing, flossing, and dental visits.    NEXT PREVENTATIVE PHYSICAL DUE IN 1 YEAR. Return in about 2 months (around 10/30/2023).

## 2023-08-30 NOTE — Assessment & Plan Note (Signed)
 Under good control on current regimen. Continue current regimen. Continue to monitor. Call with any concerns. Refills given. Labs drawn today.

## 2023-08-30 NOTE — Assessment & Plan Note (Signed)
 Reassured patient. Continue to monitor.

## 2023-08-30 NOTE — Assessment & Plan Note (Signed)
 BP running high. Will increase her lisinopril to 20mg  and recheck in 1-2 months. Call with any concerns.

## 2023-08-31 ENCOUNTER — Encounter: Payer: Self-pay | Admitting: Family Medicine

## 2023-08-31 LAB — CBC WITH DIFFERENTIAL/PLATELET
Basophils Absolute: 0 10*3/uL (ref 0.0–0.2)
Basos: 1 %
EOS (ABSOLUTE): 0.2 10*3/uL (ref 0.0–0.4)
Eos: 4 %
Hematocrit: 42.1 % (ref 34.0–46.6)
Hemoglobin: 13.5 g/dL (ref 11.1–15.9)
Immature Grans (Abs): 0 10*3/uL (ref 0.0–0.1)
Immature Granulocytes: 0 %
Lymphocytes Absolute: 1.6 10*3/uL (ref 0.7–3.1)
Lymphs: 34 %
MCH: 27.4 pg (ref 26.6–33.0)
MCHC: 32.1 g/dL (ref 31.5–35.7)
MCV: 85 fL (ref 79–97)
Monocytes Absolute: 0.5 10*3/uL (ref 0.1–0.9)
Monocytes: 10 %
Neutrophils Absolute: 2.5 10*3/uL (ref 1.4–7.0)
Neutrophils: 51 %
Platelets: 288 10*3/uL (ref 150–450)
RBC: 4.93 x10E6/uL (ref 3.77–5.28)
RDW: 14.4 % (ref 11.7–15.4)
WBC: 4.8 10*3/uL (ref 3.4–10.8)

## 2023-08-31 LAB — LIPID PANEL W/O CHOL/HDL RATIO
Cholesterol, Total: 160 mg/dL (ref 100–199)
HDL: 54 mg/dL (ref >39)
LDL Chol Calc (NIH): 94 mg/dL (ref 0–99)
Triglycerides: 61 mg/dL (ref 0–149)
VLDL Cholesterol Cal: 12 mg/dL (ref 5–40)

## 2023-08-31 LAB — COMPREHENSIVE METABOLIC PANEL WITH GFR
ALT: 22 IU/L (ref 0–32)
AST: 22 IU/L (ref 0–40)
Albumin: 3.9 g/dL (ref 3.8–4.8)
Alkaline Phosphatase: 75 IU/L (ref 44–121)
BUN/Creatinine Ratio: 15 (ref 12–28)
BUN: 14 mg/dL (ref 8–27)
Bilirubin Total: 0.2 mg/dL (ref 0.0–1.2)
CO2: 26 mmol/L (ref 20–29)
Calcium: 9.4 mg/dL (ref 8.7–10.3)
Chloride: 107 mmol/L — ABNORMAL HIGH (ref 96–106)
Creatinine, Ser: 0.96 mg/dL (ref 0.57–1.00)
Globulin, Total: 2.3 g/dL (ref 1.5–4.5)
Glucose: 85 mg/dL (ref 70–99)
Potassium: 4.1 mmol/L (ref 3.5–5.2)
Sodium: 145 mmol/L — ABNORMAL HIGH (ref 134–144)
Total Protein: 6.2 g/dL (ref 6.0–8.5)
eGFR: 63 mL/min/{1.73_m2} (ref >59)

## 2023-08-31 LAB — TSH: TSH: 3.28 u[IU]/mL (ref 0.450–4.500)

## 2023-09-01 NOTE — Progress Notes (Signed)
 Letter printed and mailed.

## 2023-09-05 ENCOUNTER — Ambulatory Visit: Payer: Self-pay

## 2023-09-05 VITALS — Ht 63.0 in | Wt 165.0 lb

## 2023-09-05 DIAGNOSIS — Z0000 Encounter for general adult medical examination without abnormal findings: Secondary | ICD-10-CM

## 2023-09-05 DIAGNOSIS — Z1231 Encounter for screening mammogram for malignant neoplasm of breast: Secondary | ICD-10-CM

## 2023-09-05 NOTE — Progress Notes (Signed)
 Subjective:   Jasmine Church is a 73 y.o. who presents for a Medicare Wellness preventive visit.  Visit Complete: Virtual I connected with  Virgil Slinger on 09/05/23 by a audio enabled telemedicine application and verified that I am speaking with the correct person using two identifiers.  Patient Location: Home  Provider Location: Office/Clinic  I discussed the limitations of evaluation and management by telemedicine. The patient expressed understanding and agreed to proceed.  Vital Signs: Because this visit was a virtual/telehealth visit, some criteria may be missing or patient reported. Any vitals not documented were not able to be obtained and vitals that have been documented are patient reported.  VideoDeclined- This patient declined Librarian, academic. Therefore the visit was completed with audio only.  Persons Participating in Visit: Patient.  AWV Questionnaire: No: Patient Medicare AWV questionnaire was not completed prior to this visit.  Cardiac Risk Factors include: advanced age (>59men, >45 women);dyslipidemia;hypertension;Other (see comment), Risk factor comments: CAD     Objective:    Today's Vitals   09/05/23 1552  Weight: 165 lb (74.8 kg)  Height: 5\' 3"  (1.6 m)   Body mass index is 29.23 kg/m.     09/05/2023    4:03 PM 08/29/2022    9:51 AM 08/09/2021   12:21 PM 08/07/2020    2:29 PM 08/05/2019   11:24 AM  Advanced Directives  Does Patient Have a Medical Advance Directive? No No No No No  Would patient like information on creating a medical advance directive? No - Patient declined No - Patient declined No - Patient declined      Current Medications (verified) Outpatient Encounter Medications as of 09/05/2023  Medication Sig   alendronate (FOSAMAX) 70 MG tablet TAKE 1 TABLET BY MOUTH WEEKLY  WITH 8 OZ OF PLAIN WATER 30  MINUTES BEFORE FIRST FOOD, DRINK OR MEDS. STAY UPRIGHT FOR 30  MINS   aspirin EC 81 MG tablet Take 81 mg  by mouth daily.   atorvastatin (LIPITOR) 20 MG tablet Take 1 tablet (20 mg total) by mouth at bedtime.   Calcium Carbonate-Vitamin D 500-125 MG-UNIT TABS Take by mouth daily. Patient takes it only 3 days a week.   clobetasol ointment (TEMOVATE) 0.05 % Apply 1 Application topically 2 (two) times daily.   furosemide (LASIX) 20 MG tablet Take 1 tablet (20 mg total) by mouth daily.   lisinopril (ZESTRIL) 20 MG tablet Take 1 tablet (20 mg total) by mouth daily.   omeprazole (PRILOSEC) 20 MG capsule Take 1 capsule (20 mg total) by mouth daily.   polyethylene glycol powder (GLYCOLAX/MIRALAX) powder Take 17 g by mouth 3 (three) times daily as needed.   No facility-administered encounter medications on file as of 09/05/2023.    Allergies (verified) Patient has no known allergies.   History: Past Medical History:  Diagnosis Date   Arthritis    CKD (chronic kidney disease) stage 3, GFR 30-59 ml/min (HCC)    Hyperlipidemia    Hypertension    Vaginal atrophy    Past Surgical History:  Procedure Laterality Date   HEMORROIDECTOMY     HERNIA REPAIR     Family History  Problem Relation Age of Onset   Arthritis Mother    Hyperlipidemia Mother    Hypertension Mother    Heart disease Father    Hypertension Father    Heart attack Father    Cancer Sister        Uterine, unsure of age   Hypertension Sister  Heart disease Brother    Heart attack Brother    Hypertension Brother    Heart attack Brother    Hypertension Brother    Stroke Maternal Grandmother    Diabetes Neg Hx    Breast cancer Neg Hx    Social History   Socioeconomic History   Marital status: Married    Spouse name: Eulah Pont   Number of children: 1   Years of education: Not on file   Highest education level: Not on file  Occupational History   Occupation: retired  Tobacco Use   Smoking status: Former    Current packs/day: 0.00    Average packs/day: 1 pack/day for 33.0 years (33.0 ttl pk-yrs)    Types: Cigarettes     Start date: 06/07/1971    Quit date: 06/06/2004    Years since quitting: 19.2    Passive exposure: Past   Smokeless tobacco: Never  Vaping Use   Vaping status: Never Used  Substance and Sexual Activity   Alcohol use: No   Drug use: No   Sexual activity: Not Currently  Other Topics Concern   Not on file  Social History Narrative   Not on file   Social Drivers of Health   Financial Resource Strain: Low Risk  (09/05/2023)   Overall Financial Resource Strain (CARDIA)    Difficulty of Paying Living Expenses: Not hard at all  Food Insecurity: No Food Insecurity (09/05/2023)   Hunger Vital Sign    Worried About Running Out of Food in the Last Year: Never true    Ran Out of Food in the Last Year: Never true  Transportation Needs: No Transportation Needs (09/05/2023)   PRAPARE - Administrator, Civil Service (Medical): No    Lack of Transportation (Non-Medical): No  Physical Activity: Sufficiently Active (09/05/2023)   Exercise Vital Sign    Days of Exercise per Week: 7 days    Minutes of Exercise per Session: 30 min  Stress: No Stress Concern Present (09/05/2023)   [employer] of Occupational Health - Occupational Stress Questionnaire    Feeling of Stress : Not at all  Social Connections: Moderately Integrated (09/05/2023)   Social Connection and Isolation Panel [NHANES]    Frequency of Communication with Friends and Family: Once a week    Frequency of Social Gatherings with Friends and Family: More than three times a week    Attends Religious Services: More than 4 times per year    Active Member of [employer] or Organizations: No    Attends Engineer, structural: Never    Marital Status: Married    Tobacco Counseling Counseling given: Not Answered    Clinical Intake:  Pre-visit preparation completed: Yes  Pain : No/denies pain     BMI - recorded: 29.23 Nutritional Status: BMI 25 -29 Overweight Nutritional Risks: None Diabetes: No  Lab Results  Component  Value Date   HGBA1C 5.9 02/03/2021   HGBA1C 5.4 04/12/2018     How often do you need to have someone help you when you read instructions, pamphlets, or other written materials from your doctor or pharmacy?: 1 - Never  Interpreter Needed?: No  Information entered by :: Tora Kindred, CMA   Activities of Daily Living     09/05/2023    3:53 PM  In your present state of health, do you have any difficulty performing the following activities:  Hearing? 0  Vision? 0  Difficulty concentrating or making decisions? 0  Walking or climbing stairs?  0  Dressing or bathing? 0  Doing errands, shopping? 0  Preparing Food and eating ? N  Using the Toilet? N  In the past six months, have you accidently leaked urine? N  Do you have problems with loss of bowel control? N  Managing your Medications? N  Managing your Finances? N  Housekeeping or managing your Housekeeping? N    Patient Care Team: [name], DO as PCP - General (Family Medicine) Pa, [facility] (Optometry) Jesusita Oka, MD as Referring Physician (Dermatology)  Indicate any recent Medical Services you may have received from other than [facility] providers in the past year (date may be approximate).     Assessment:   This is a routine wellness examination for Riyan.  Hearing/Vision screen Hearing Screening - Comments:: Denies hearing loss Vision Screening - Comments:: Gets routine eye exams (every 2 years), [facility], [place]   Goals Addressed             This Visit's Progress    Patient Stated       Maintain current health and lose 8 lbs       Depression Screen     09/05/2023    4:01 PM 08/30/2023    8:24 AM 07/19/2023    2:19 PM 03/01/2023    8:25 AM 08/29/2022    9:49 AM 08/29/2022    9:00 AM 01/05/2022    9:53 AM  PHQ 2/9 Scores  PHQ - 2 Score 0 0 0 0 0 0 0  PHQ- 9 Score 0 0 0 0 0 0 0    Fall Risk     09/05/2023    4:04 PM 08/30/2023    8:24 AM 07/19/2023    2:19 PM 03/01/2023    8:24 AM  08/29/2022    9:00 AM  Fall Risk   Falls in the past year? 0 0 0 0 0  Number falls in past yr: 0 0 0 0 0  Injury with Fall? 0 0 0 0 0  Risk for fall due to : No Fall Risks No Fall Risks No Fall Risks No Fall Risks No Fall Risks  Follow up Falls prevention discussed;Falls evaluation completed Falls evaluation completed Falls evaluation completed Falls evaluation completed Falls evaluation completed    MEDICARE RISK AT HOME:  Medicare Risk at Home Any stairs in or around the home?: Yes If so, are there any without handrails?: No Home free of loose throw rugs in walkways, pet beds, electrical cords, etc?: Yes Adequate lighting in your home to reduce risk of falls?: Yes Life alert?: No Use of a cane, walker or w/c?: No Grab bars in the bathroom?: Yes Shower chair or bench in shower?: Yes Elevated toilet seat or a handicapped toilet?: No  TIMED UP AND GO:  Was the test performed?  No  Cognitive Function: 6CIT completed        09/05/2023    4:05 PM 08/29/2022    9:16 AM 08/07/2020    2:32 PM 04/23/2019    9:56 AM 04/12/2018    8:29 AM  6CIT Screen  What Year? 0 points 4 points 0 points 0 points 0 points  What month? 0 points 0 points 0 points 0 points 0 points  What time? 0 points 0 points 0 points 0 points 0 points  Count back from 20 0 points 0 points 0 points 0 points 0 points  Months in reverse 4 points 0 points 0 points 0 points 0 points  Repeat phrase 0 points 0 points 4 points 0 points 2 points  Total Score 4 points 4 points 4 points 0 points 2 points    Immunizations Immunization History  Administered Date(s) Administered   Fluad Quad(high Dose 65+) 02/18/2019, 03/02/2020, 03/01/2021, 03/01/2022   Fluad Trivalent(High Dose 65+) 03/20/2023   Influenza, High Dose Seasonal PF 03/12/2018   Influenza,inj,Quad PF,6+ Mos 04/21/2016, 03/07/2017   Pneumococcal Conjugate-13 03/06/2019   Pneumococcal Polysaccharide-23 07/08/2021   Tdap 03/02/2016    Screening Tests Health  Maintenance  Topic Date Due   Zoster Vaccines- Shingrix (1 of 2) 11/30/2023 (Originally 04/14/2002)   INFLUENZA VACCINE  01/05/2024   MAMMOGRAM  03/05/2024   Fecal DNA (Cologuard)  07/21/2024   Medicare Annual Wellness (AWV)  09/04/2024   DEXA SCAN  03/04/2025   DTaP/Tdap/Td (2 - Td or Tdap) 03/02/2026   Pneumonia Vaccine 65+ Years old  Completed   Hepatitis C Screening  Completed   HPV VACCINES  Aged Out   COVID-19 Vaccine  Discontinued    Health Maintenance  There are no preventive care reminders to display for this patient. Health Maintenance Items Addressed: Mammogram ordered, See Nurse Notes  Additional Screening:  Vision Screening: Recommended annual ophthalmology exams for early detection of glaucoma and other disorders of the eye.  Dental Screening: Recommended annual dental exams for proper oral hygiene  Community Resource Referral / Chronic Care Management: CRR required this visit?  No   CCM required this visit?  No     Plan:     I have personally reviewed and noted the following in the patient's chart:   Medical and social history Use of alcohol, tobacco or illicit drugs  Current medications and supplements including opioid prescriptions. Patient is not currently taking opioid prescriptions. Functional ability and status Nutritional status Physical activity Advanced directives List of other physicians Hospitalizations, surgeries, and ER visits in previous 12 months Vitals Screenings to include cognitive, depression, and falls Referrals and appointments  In addition, I have reviewed and discussed with patient certain preventive protocols, quality metrics, and best practice recommendations. A written personalized care plan for preventive services as well as general preventive health recommendations were provided to patient.     Tora Kindred, CMA   09/05/2023   After Visit Summary: (Mail) Due to this being a telephonic visit, the after visit summary  with patients personalized plan was offered to patient via mail   Notes:  6 CIT Score - 4 Placed order for a MMG due 03/05/24 Declined covid and shingles vaccines Patient declined scheduling another AWV

## 2023-09-05 NOTE — Patient Instructions (Signed)
 Ms. Sedivy , Thank you for taking time to come for your Medicare Wellness Visit. I appreciate your ongoing commitment to your health goals. Please review the following plan we discussed and let me know if I can assist you in the future.   Referrals/Orders/Follow-Ups/Clinician Recommendations: I have placed an order for a mammogram (due 03/05/24). Call [facility] @ [phone] to schedule at your convenience.  This is a list of the screening recommended for you and due dates:  Health Maintenance  Topic Date Due   Zoster (Shingles) Vaccine (1 of 2) 11/30/2023*   Flu Shot  01/05/2024   Mammogram  03/05/2024   Cologuard (Stool DNA test)  07/21/2024   Medicare Annual Wellness Visit  09/04/2024   DEXA scan (bone density measurement)  03/04/2025   DTaP/Tdap/Td vaccine (2 - Td or Tdap) 03/02/2026   Pneumonia Vaccine  Completed   Hepatitis C Screening  Completed   HPV Vaccine  Aged Out   COVID-19 Vaccine  Discontinued  *Topic was postponed. The date shown is not the original due date.    Advanced directives: (Declined) Advance directive discussed with you today. Even though you declined this today, please call our office should you change your mind, and we can give you the proper paperwork for you to fill out.  Next Medicare Annual Wellness Visit scheduled for next year: No, patient declined.

## 2023-09-14 DIAGNOSIS — D0472 Carcinoma in situ of skin of left lower limb, including hip: Secondary | ICD-10-CM | POA: Diagnosis not present

## 2023-09-14 DIAGNOSIS — D485 Neoplasm of uncertain behavior of skin: Secondary | ICD-10-CM | POA: Diagnosis not present

## 2023-09-14 DIAGNOSIS — L900 Lichen sclerosus et atrophicus: Secondary | ICD-10-CM | POA: Diagnosis not present

## 2023-09-16 IMAGING — MG MM DIGITAL SCREENING BILAT W/ TOMO AND CAD
4 of 9 series · 4 of 29 positions shown · non-contrast
Comparison: Previous exam(s).

CLINICAL DATA: Screening.

EXAM:
DIGITAL SCREENING BILATERAL MAMMOGRAM WITH TOMOSYNTHESIS AND CAD
TECHNIQUE: Bilateral screening digital craniocaudal and mediolateral oblique
mammograms were obtained. Bilateral screening digital breast
tomosynthesis was performed. The images were evaluated with
computer-aided detection.

[R MLO synth-2D]
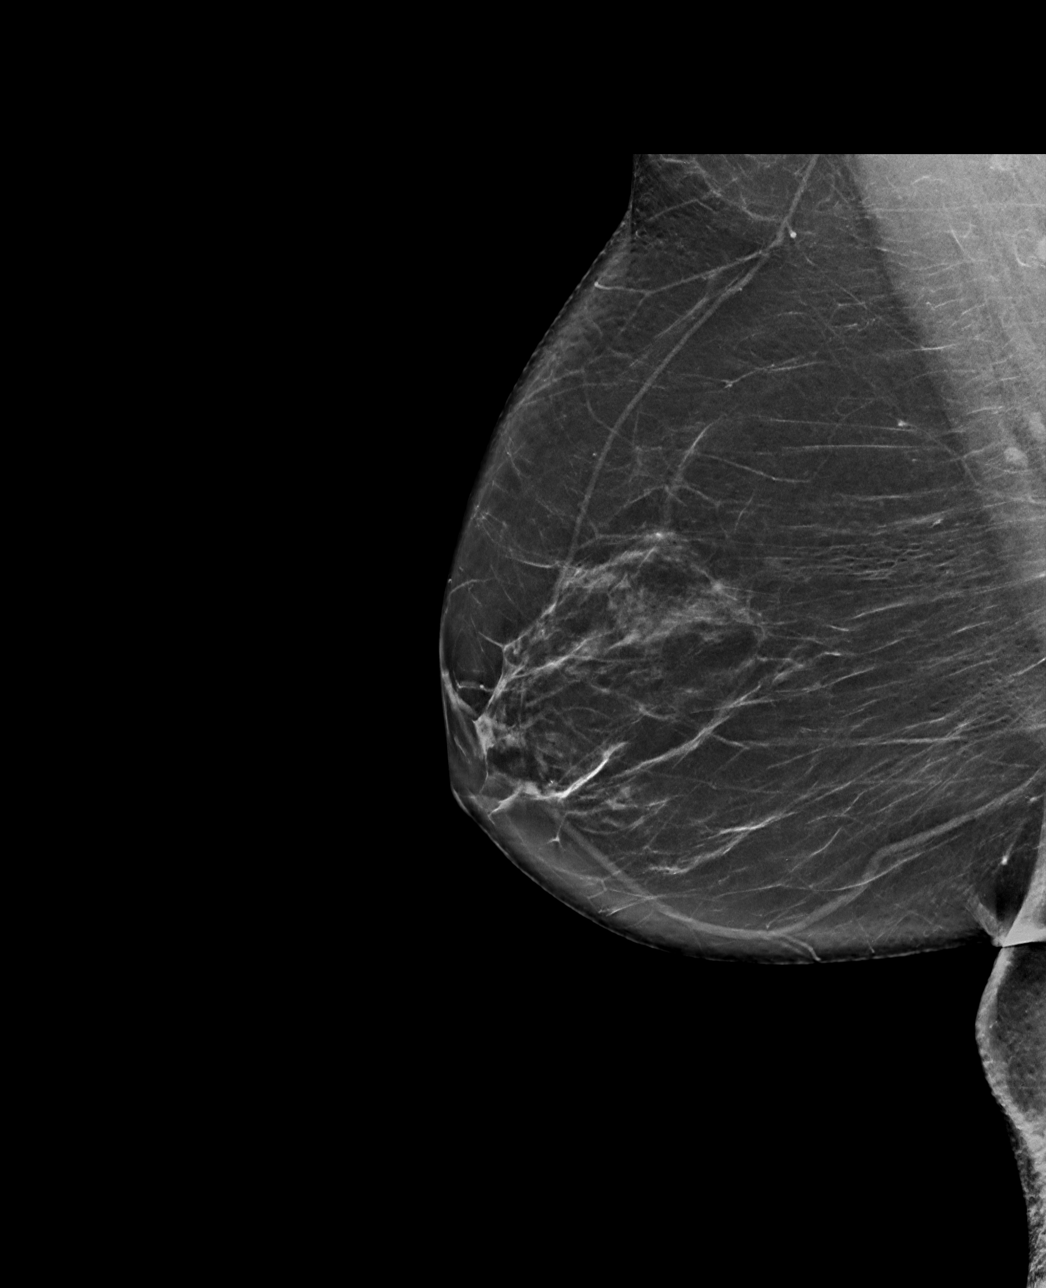

[R CC synth-2D]
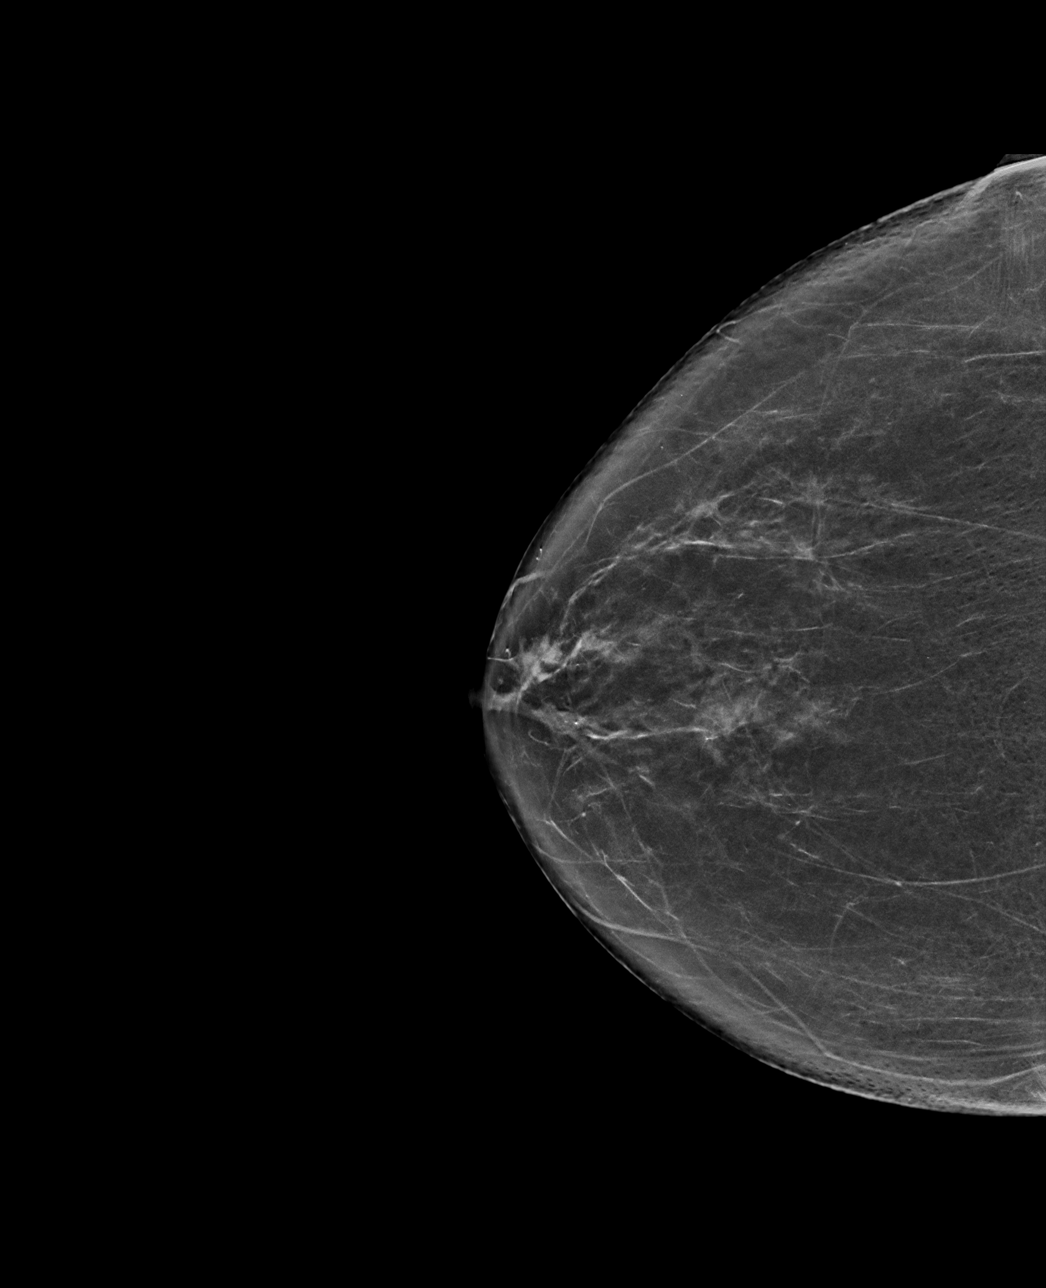

[L CC synth-2D]
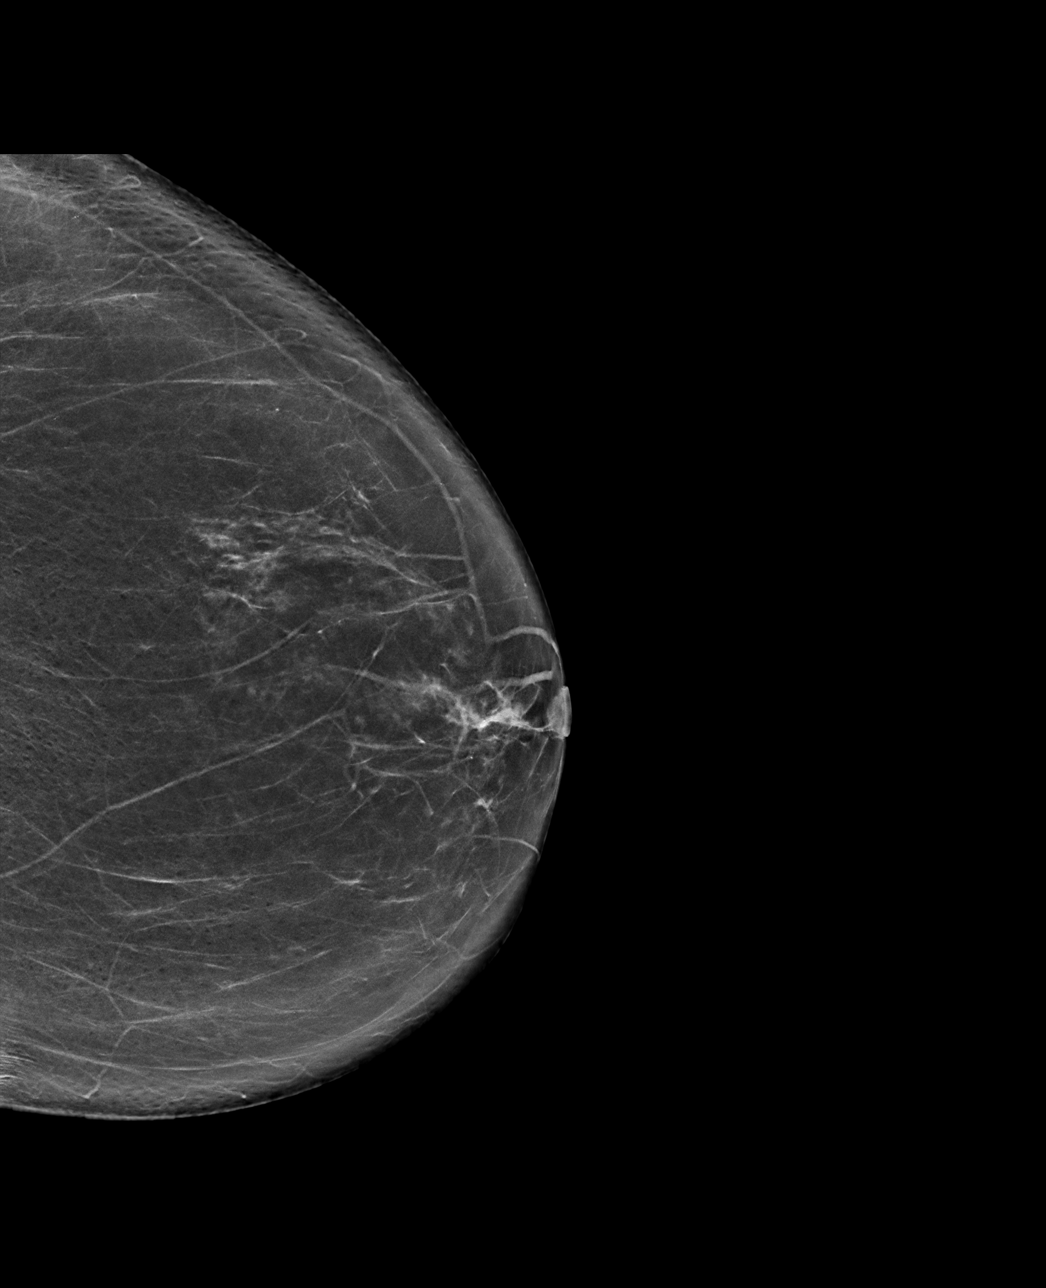

[L MLO synth-2D]
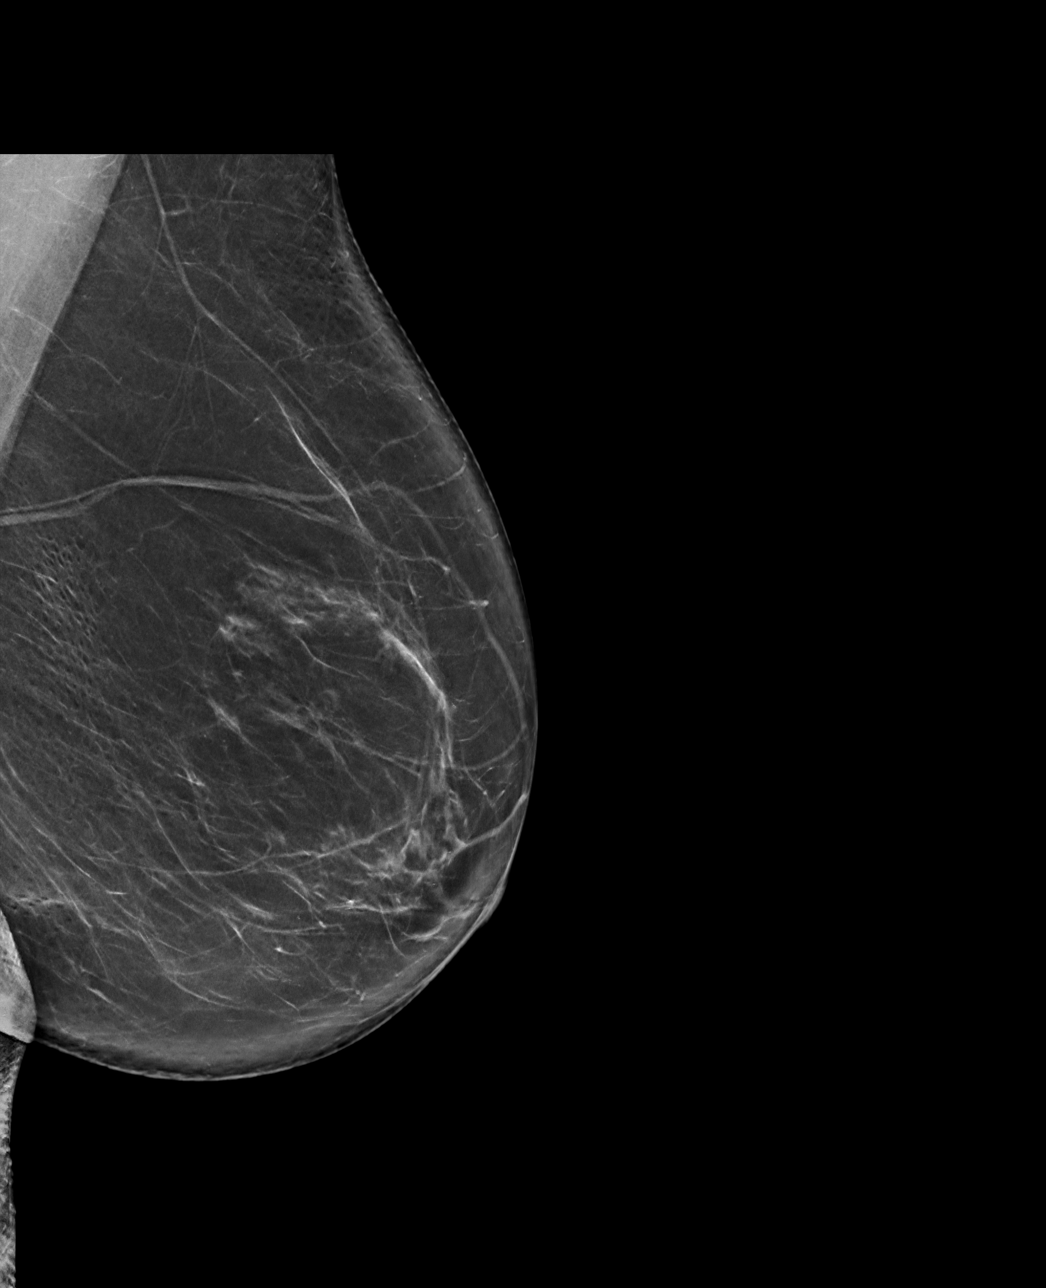

[4 of 29 positions shown; findings below may reference images not displayed]

ACR Breast Density Category b: There are scattered areas of
fibroglandular density.
FINDINGS: There are no findings suspicious for malignancy.
IMPRESSION: No mammographic evidence of malignancy. A result letter of this
screening mammogram will be mailed directly to the patient.

RECOMMENDATION:
Screening mammogram in one year. (Code:51-O-LD2)

BI-RADS CATEGORY  1: Negative.

## 2023-09-25 DIAGNOSIS — H2513 Age-related nuclear cataract, bilateral: Secondary | ICD-10-CM | POA: Diagnosis not present

## 2023-10-17 DIAGNOSIS — C44729 Squamous cell carcinoma of skin of left lower limb, including hip: Secondary | ICD-10-CM | POA: Diagnosis not present

## 2023-10-17 DIAGNOSIS — D0472 Carcinoma in situ of skin of left lower limb, including hip: Secondary | ICD-10-CM | POA: Diagnosis not present

## 2023-10-31 ENCOUNTER — Other Ambulatory Visit: Payer: Self-pay | Admitting: Family Medicine

## 2023-11-02 ENCOUNTER — Encounter: Payer: Self-pay | Admitting: Family Medicine

## 2023-11-02 ENCOUNTER — Ambulatory Visit (INDEPENDENT_AMBULATORY_CARE_PROVIDER_SITE_OTHER): Admitting: Family Medicine

## 2023-11-02 VITALS — BP 154/90 | HR 80 | Ht 62.0 in | Wt 154.2 lb

## 2023-11-02 DIAGNOSIS — I129 Hypertensive chronic kidney disease with stage 1 through stage 4 chronic kidney disease, or unspecified chronic kidney disease: Secondary | ICD-10-CM

## 2023-11-02 DIAGNOSIS — L03116 Cellulitis of left lower limb: Secondary | ICD-10-CM | POA: Diagnosis not present

## 2023-11-02 MED ORDER — FLUCONAZOLE 150 MG PO TABS: 150.0000 mg | ORAL_TABLET | Freq: Once | ORAL | 0 refills | Status: AC

## 2023-11-02 MED ORDER — SULFAMETHOXAZOLE-TRIMETHOPRIM 800-160 MG PO TABS: 1.0000 | ORAL_TABLET | Freq: Two times a day (BID) | ORAL | 0 refills | Status: DC

## 2023-11-02 NOTE — Telephone Encounter (Signed)
 Requested Prescriptions  Pending Prescriptions Disp Refills   lisinopril  (ZESTRIL ) 20 MG tablet [Pharmacy Med Name: Lisinopril  20 MG Oral Tablet] 100 tablet 0    Sig: TAKE 1 TABLET BY MOUTH DAILY     Cardiovascular:  ACE Inhibitors Failed - 11/02/2023  9:40 AM      Failed - Last BP in normal range    BP Readings from Last 1 Encounters:  11/02/23 (!) 154/90         Passed - Cr in normal range and within 180 days    Creatinine, Ser  Date Value Ref Range Status  08/30/2023 0.96 0.57 - 1.00 mg/dL Final         Passed - K in normal range and within 180 days    Potassium  Date Value Ref Range Status  08/30/2023 4.1 3.5 - 5.2 mmol/L Final         Passed - Patient is not pregnant      Passed - Valid encounter within last 6 months    Recent Outpatient Visits           Today Benign hypertensive renal disease   [facility] [place], Megan P, DO   2 months ago Routine general medical examination at a health care facility   [facility] [place] P, DO   3 months ago Acute cystitis without hematuria   [facility] [place], Megan P, DO

## 2023-11-02 NOTE — Progress Notes (Signed)
 BP (!) 154/90   Pulse 80   Ht 5\' 2"  (1.575 m)   Wt 154 lb 3.2 oz (69.9 kg)   SpO2 94%   BMI 28.20 kg/m    Subjective:    Patient ID: Jasmine Church, female    DOB: [birth year], 73 y.o.   MRN: [id number]  HPI: Jasmine Church is a 73 y.o. female  Chief Complaint  Patient presents with   Hypertension   HYPERTENSION  Hypertension status: uncontrolled  Satisfied with current treatment? yes Duration of hypertension: chronic BP monitoring frequency:  rarely BP medication side effects:  no Medication compliance: excellent compliance Previous BP meds:lisinopril  Aspirin: no Recurrent headaches: no Visual changes: no Palpitations: no Dyspnea: no Chest pain: no Lower extremity edema: no Dizzy/lightheaded: no  SKIN INFECTION Duration: 1 week Location: L thigh History of trauma in area: yes Pain: yes Quality: sore Severity: mild Redness: yes Swelling: yes Oozing: no Pus: no Fevers: no Nausea/vomiting: no Status: worse Treatments attempted:none  Tetanus: UTD  Relevant past medical, surgical, family and social history reviewed and updated as indicated. Interim medical history since our last visit reviewed. Allergies and medications reviewed and updated.  Review of Systems  Constitutional: Negative.   Respiratory: Negative.    Cardiovascular: Negative.   Musculoskeletal: Negative.   Skin:  Positive for color change and wound. Negative for pallor and rash.  Neurological: Negative.   Psychiatric/Behavioral: Negative.      Per HPI unless specifically indicated above     Objective:     BP (!) 154/90   Pulse 80   Ht 5\' 2"  (1.575 m)   Wt 154 lb 3.2 oz (69.9 kg)   SpO2 94%   BMI 28.20 kg/m   Wt Readings from Last 3 Encounters:  11/02/23 154 lb 3.2 oz (69.9 kg)  09/05/23 165 lb (74.8 kg)  08/30/23 159 lb 9.6 oz (72.4 kg)    Physical Exam Vitals and nursing note reviewed.  Constitutional:      General: She is not in acute distress.     Appearance: Normal appearance. She is not ill-appearing, toxic-appearing or diaphoretic.  HENT:     Head: Normocephalic and atraumatic.     Right Ear: External ear normal.     Left Ear: External ear normal.     Nose: Nose normal.     Mouth/Throat:     Mouth: Mucous membranes are moist.     Pharynx: Oropharynx is clear.  Eyes:     General: No scleral icterus.       Right eye: No discharge.        Left eye: No discharge.     Extraocular Movements: Extraocular movements intact.     Conjunctiva/sclera: Conjunctivae normal.     Pupils: Pupils are equal, round, and reactive to light.  Cardiovascular:     Rate and Rhythm: Normal rate and regular rhythm.     Pulses: Normal pulses.     Heart sounds: Normal heart sounds. No murmur heard.    No friction rub. No gallop.  Pulmonary:     Effort: Pulmonary effort is normal. No respiratory distress.     Breath sounds: Normal breath sounds. No stridor. No wheezing, rhonchi or rales.  Chest:     Chest wall: No tenderness.  Musculoskeletal:        General: Normal range of motion.     Cervical back: Normal range of motion and neck supple.  Skin:    General: Skin is warm and dry.  Capillary Refill: Capillary refill takes less than 2 seconds.     Coloration: Skin is not jaundiced or pale.     Findings: Erythema (redness and swelling near biopsy site on L thigh) present. No bruising, lesion or rash.  Neurological:     General: No focal deficit present.     Mental Status: She is alert and oriented to person, place, and time. Mental status is at baseline.  Psychiatric:        Mood and Affect: Mood normal.        Behavior: Behavior normal.        Thought Content: Thought content normal.        Judgment: Judgment normal.     Results for orders placed or performed in visit on 08/30/23  Microscopic Examination   Collection Time: 08/30/23  8:51 AM   Urine  Result Value Ref Range   WBC, UA None seen 0 - 5 /hpf   RBC, Urine 0-2 0 - 2 /hpf    Epithelial Cells (non renal) 0-10 0 - 10 /hpf   Bacteria, UA None seen None seen/Few  Urinalysis, Routine w reflex microscopic   Collection Time: 08/30/23  8:51 AM  Result Value Ref Range   Specific Gravity, UA 1.010 1.005 - 1.030   pH, UA 5.5 5.0 - 7.5   Color, UA Yellow Yellow   Appearance Ur Clear Clear   Leukocytes,UA Negative Negative   Protein,UA Negative Negative/Trace   Glucose, UA Negative Negative   Ketones, UA Negative Negative   RBC, UA 2+ (A) Negative   Bilirubin, UA Negative Negative   Urobilinogen, Ur 0.2 0.2 - 1.0 mg/dL   Nitrite, UA Negative Negative   Microscopic Examination See below:   Microalbumin, Urine Waived   Collection Time: 08/30/23  8:51 AM  Result Value Ref Range   Microalb, Ur Waived 10 0 - 19 mg/L   Creatinine, Urine Waived 50 10 - 300 mg/dL   Microalb/Creat Ratio 30-300 (H) <30 mg/g  CBC with Differential/Platelet   Collection Time: 08/30/23  8:52 AM  Result Value Ref Range   WBC 4.8 3.4 - 10.8 x10E3/uL   RBC 4.93 3.77 - 5.28 x10E6/uL   Hemoglobin 13.5 11.1 - 15.9 g/dL   Hematocrit 62.1 30.8 - 46.6 %   MCV 85 79 - 97 fL   MCH 27.4 26.6 - 33.0 pg   MCHC 32.1 31.5 - 35.7 g/dL   RDW 65.7 84.6 - 96.2 %   Platelets 288 150 - 450 x10E3/uL   Neutrophils 51 Not Estab. %   Lymphs 34 Not Estab. %   Monocytes 10 Not Estab. %   Eos 4 Not Estab. %   Basos 1 Not Estab. %   Neutrophils Absolute 2.5 1.4 - 7.0 x10E3/uL   Lymphocytes Absolute 1.6 0.7 - 3.1 x10E3/uL   Monocytes Absolute 0.5 0.1 - 0.9 x10E3/uL   EOS (ABSOLUTE) 0.2 0.0 - 0.4 x10E3/uL   Basophils Absolute 0.0 0.0 - 0.2 x10E3/uL   Immature Granulocytes 0 Not Estab. %   Immature Grans (Abs) 0.0 0.0 - 0.1 x10E3/uL  Comprehensive metabolic panel   Collection Time: 08/30/23  8:52 AM  Result Value Ref Range   Glucose 85 70 - 99 mg/dL   BUN 14 8 - 27 mg/dL   Creatinine, Ser 9.52 0.57 - 1.00 mg/dL   eGFR 63 >84 XL/KGM/0.10   BUN/Creatinine Ratio 15 12 - 28   Sodium 145 (H) 134 - 144 mmol/L    Potassium 4.1 3.5 -  5.2 mmol/L   Chloride 107 (H) 96 - 106 mmol/L   CO2 26 20 - 29 mmol/L   Calcium  9.4 8.7 - 10.3 mg/dL   Total Protein 6.2 6.0 - 8.5 g/dL   Albumin 3.9 3.8 - 4.8 g/dL   Globulin, Total 2.3 1.5 - 4.5 g/dL   Bilirubin Total 0.2 0.0 - 1.2 mg/dL   Alkaline Phosphatase 75 44 - 121 IU/L   AST 22 0 - 40 IU/L   ALT 22 0 - 32 IU/L  Lipid Panel w/o Chol/HDL Ratio   Collection Time: 08/30/23  8:52 AM  Result Value Ref Range   Cholesterol, Total 160 100 - 199 mg/dL   Triglycerides 61 0 - 149 mg/dL   HDL 54 >16 mg/dL   VLDL Cholesterol Cal 12 5 - 40 mg/dL   LDL Chol Calc (NIH) 94 0 - 99 mg/dL  TSH   Collection Time: 08/30/23  8:52 AM  Result Value Ref Range   TSH 3.280 0.450 - 4.500 uIU/mL      Assessment & Plan:   Problem List Items Addressed This Visit       Genitourinary   Benign hypertensive renal disease - Primary   Still running high, but with cellulitis. Will treat that and recheck in 1 week.       Relevant Orders   Basic metabolic panel with GFR   Other Visit Diagnoses       Cellulitis of left lower extremity       Will treat with bactrim . Recheck 1 week. Call with any concerns.        Follow up plan: Return in about 1 week (around 11/09/2023) for ok to double book if needed.

## 2023-11-02 NOTE — Assessment & Plan Note (Signed)
 Still running high, but with cellulitis. Will treat that and recheck in 1 week.

## 2023-11-03 ENCOUNTER — Ambulatory Visit: Payer: Self-pay | Admitting: Family Medicine

## 2023-11-03 LAB — BASIC METABOLIC PANEL WITH GFR
BUN/Creatinine Ratio: 14 (ref 12–28)
BUN: 13 mg/dL (ref 8–27)
CO2: 23 mmol/L (ref 20–29)
Calcium: 9.2 mg/dL (ref 8.7–10.3)
Chloride: 104 mmol/L (ref 96–106)
Creatinine, Ser: 0.91 mg/dL (ref 0.57–1.00)
Glucose: 91 mg/dL (ref 70–99)
Potassium: 4.3 mmol/L (ref 3.5–5.2)
Sodium: 142 mmol/L (ref 134–144)
eGFR: 67 mL/min/{1.73_m2} (ref >59)

## 2023-11-03 NOTE — Progress Notes (Signed)
 Letter printed and mailed.

## 2023-11-13 ENCOUNTER — Encounter: Payer: Self-pay | Admitting: Family Medicine

## 2023-11-13 ENCOUNTER — Ambulatory Visit (INDEPENDENT_AMBULATORY_CARE_PROVIDER_SITE_OTHER): Admitting: Family Medicine

## 2023-11-13 VITALS — BP 112/72 | HR 76 | Temp 98.5°F | Ht 62.0 in | Wt 152.8 lb

## 2023-11-13 DIAGNOSIS — I129 Hypertensive chronic kidney disease with stage 1 through stage 4 chronic kidney disease, or unspecified chronic kidney disease: Secondary | ICD-10-CM

## 2023-11-13 MED ORDER — LISINOPRIL 20 MG PO TABS: 20.0000 mg | ORAL_TABLET | Freq: Every day | ORAL | 1 refills | Status: DC

## 2023-11-13 NOTE — Assessment & Plan Note (Signed)
 Better on recheck. Will not change anything. Continue to monitor. Due for follow up in September.

## 2023-11-13 NOTE — Progress Notes (Signed)
 BP 112/72   Pulse 76   Temp 98.5 F (36.9 C) (Oral)   Ht 5\' 2"  (1.575 m)   Wt 152 lb 12.8 oz (69.3 kg)   SpO2 92%   BMI 27.95 kg/m    Subjective:    Patient ID: Jasmine Church, female    DOB: 08-16-1950, 73 y.o.   MRN: [id number]  HPI: Jasmine Church is a 73 y.o. female  Chief Complaint  Patient presents with   Hypertension   HYPERTENSION  Hypertension status: better  Satisfied with current treatment? yes Duration of hypertension: chronic BP monitoring frequency:  a few times a month BP medication side effects:  no Medication compliance: excellent compliance Previous BP meds:lisinopril , lasix  Aspirin: yes Recurrent headaches: no Visual changes: no Palpitations: no Dyspnea: no Chest pain: no Lower extremity edema: no Dizzy/lightheaded: no  Relevant past medical, surgical, family and social history reviewed and updated as indicated. Interim medical history since our last visit reviewed. Allergies and medications reviewed and updated.  Review of Systems  Constitutional: Negative.   Respiratory: Negative.    Cardiovascular: Negative.   Musculoskeletal: Negative.   Neurological: Negative.   Psychiatric/Behavioral: Negative.      Per HPI unless specifically indicated above     Objective:     BP 112/72   Pulse 76   Temp 98.5 F (36.9 C) (Oral)   Ht 5\' 2"  (1.575 m)   Wt 152 lb 12.8 oz (69.3 kg)   SpO2 92%   BMI 27.95 kg/m   Wt Readings from Last 3 Encounters:  11/13/23 152 lb 12.8 oz (69.3 kg)  11/02/23 154 lb 3.2 oz (69.9 kg)  09/05/23 165 lb (74.8 kg)    Physical Exam Vitals and nursing note reviewed.  Constitutional:      General: She is not in acute distress.    Appearance: Normal appearance. She is not ill-appearing, toxic-appearing or diaphoretic.  HENT:     Head: Normocephalic and atraumatic.     Right Ear: External ear normal.     Left Ear: External ear normal.     Nose: Nose normal.     Mouth/Throat:     Mouth: Mucous  membranes are moist.     Pharynx: Oropharynx is clear.  Eyes:     General: No scleral icterus.       Right eye: No discharge.        Left eye: No discharge.     Extraocular Movements: Extraocular movements intact.     Conjunctiva/sclera: Conjunctivae normal.     Pupils: Pupils are equal, round, and reactive to light.  Cardiovascular:     Rate and Rhythm: Normal rate and regular rhythm.     Pulses: Normal pulses.     Heart sounds: Normal heart sounds. No murmur heard.    No friction rub. No gallop.  Pulmonary:     Effort: Pulmonary effort is normal. No respiratory distress.     Breath sounds: Normal breath sounds. No stridor. No wheezing, rhonchi or rales.  Chest:     Chest wall: No tenderness.  Musculoskeletal:        General: Normal range of motion.     Cervical back: Normal range of motion and neck supple.  Skin:    General: Skin is warm and dry.     Capillary Refill: Capillary refill takes less than 2 seconds.     Coloration: Skin is not jaundiced or pale.     Findings: No bruising, erythema, lesion or rash.  Neurological:     General: No focal deficit present.     Mental Status: She is alert and oriented to person, place, and time. Mental status is at baseline.  Psychiatric:        Mood and Affect: Mood normal.        Behavior: Behavior normal.        Thought Content: Thought content normal.        Judgment: Judgment normal.     Results for orders placed or performed in visit on 11/02/23  Basic metabolic panel with GFR   Collection Time: 11/02/23  9:09 AM  Result Value Ref Range   Glucose 91 70 - 99 mg/dL   BUN 13 8 - 27 mg/dL   Creatinine, Ser 1.61 0.57 - 1.00 mg/dL   eGFR 67 >09 UE/AVW/0.98   BUN/Creatinine Ratio 14 12 - 28   Sodium 142 134 - 144 mmol/L   Potassium 4.3 3.5 - 5.2 mmol/L   Chloride 104 96 - 106 mmol/L   CO2 23 20 - 29 mmol/L   Calcium  9.2 8.7 - 10.3 mg/dL      Assessment & Plan:   Problem List Items Addressed This Visit        Genitourinary   Benign hypertensive renal disease - Primary   Better on recheck. Will not change anything. Continue to monitor. Due for follow up in September.         Follow up plan: Return End of September.

## 2023-11-30 ENCOUNTER — Telehealth: Payer: Self-pay

## 2023-11-30 DIAGNOSIS — M816 Localized osteoporosis [Lequesne]: Secondary | ICD-10-CM

## 2023-11-30 NOTE — Telephone Encounter (Signed)
 Copied from CRM [id number]. Topic: Clinical - Medical Advice >> Nov 30, 2023 10:17 AM Jasmine Church wrote: Reason for CRM: patient went to the dentist and she was told that the medication is causing her tooth to crumble and when the dentist found out she was taking this medication and now she refuse to remove the tooth and says the patient would have to go to the surgeon and the patient says she is not sure if she still wants to take the medication any longer and she wants medical advice, she wants to ask for a different medication that builds bones but doesn't bother you teeth  Pt num [phone] (H)  alendronate  (FOSAMAX ) 70 MG tablet

## 2023-12-01 NOTE — Telephone Encounter (Signed)
Will leave for PCP review upon return. °

## 2023-12-07 NOTE — Telephone Encounter (Signed)
Referral to osteoporosis clinic placed.

## 2023-12-12 NOTE — Telephone Encounter (Signed)
 Patient is aware referral has been placed and given information as well as it being mailed today to her home. Advised that if she chooses not to follow through on the referral that she should then schedule follow up with PCP.

## 2023-12-17 ENCOUNTER — Other Ambulatory Visit: Payer: Self-pay | Admitting: Family Medicine

## 2023-12-19 NOTE — Telephone Encounter (Signed)
 Requested medication (s) are due for refill today:   Yes  Requested medication (s) are on the active medication list:   Yes  Future visit scheduled:   Yes 9/9    LOV 11/13/2023   Last ordered: 04/13/2023 #12, 2 refills  Unable to refill because labs are due per protocol.    Requested Prescriptions  Pending Prescriptions Disp Refills   alendronate  (FOSAMAX ) 70 MG tablet [Pharmacy Med Name: Alendronate  Sodium 70 MG Oral Tablet] 12 tablet 3    Sig: TAKE 1 TABLET BY MOUTH WEEKLY  WITH 8 OZ OF PLAIN WATER 30  MINUTES BEFORE FIRST FOOD, DRINK OR MEDS. STAY UPRIGHT FOR 30  MINS     Endocrinology:  Bisphosphonates Failed - 12/19/2023  9:49 AM      Failed - Vitamin D  in normal range and within 360 days    Vit D, 25-Hydroxy  Date Value Ref Range Status  07/06/2020 40.7 30.0 - 100.0 ng/mL Final    Comment:    Vitamin D  deficiency has been defined by the Institute of Medicine and an Endocrine Society practice guideline as a level of serum 25-OH vitamin D  less than 20 ng/mL (1,2). The Endocrine Society went on to further define vitamin D  insufficiency as a level between 21 and 29 ng/mL (2). 1. IOM (Institute of Medicine). 2010. Dietary reference    intakes for calcium  and D. [place]: The    [employer]. 2. Holick MF, Binkley [place], Bischoff-Ferrari HA, et al.    Evaluation, treatment, and prevention of vitamin D     deficiency: an Endocrine Society clinical practice    guideline. JCEM. 2011 Jul; 96(7):1911-30.          Failed - Mg Level in normal range and within 360 days    Magnesium  Date Value Ref Range Status  03/02/2015 2.0 1.6 - 2.3 mg/dL Final         Failed - Phosphate in normal range and within 360 days    No results found for: PHOS       Passed - Ca in normal range and within 360 days    Calcium   Date Value Ref Range Status  11/02/2023 9.2 8.7 - 10.3 mg/dL Final         Passed - Cr in normal range and within 360 days    Creatinine, Ser  Date Value Ref  Range Status  11/02/2023 0.91 0.57 - 1.00 mg/dL Final         Passed - eGFR is 30 or above and within 360 days    GFR calc Af Amer  Date Value Ref Range Status  07/06/2020 66 >59 mL/min/1.73 Final    Comment:    **In accordance with recommendations from the NKF-ASN Task force,**   [facility] is in the process of updating its eGFR calculation to the   2021 CKD-EPI creatinine equation that estimates kidney function   without a race variable.    GFR calc non Af Amer  Date Value Ref Range Status  07/06/2020 57 (L) >59 mL/min/1.73 Final   eGFR  Date Value Ref Range Status  11/02/2023 67 >59 mL/min/1.73 Final         Passed - Valid encounter within last 12 months    Recent Outpatient Visits           1 month ago Benign hypertensive renal disease   [place] [facility] [place], Megan P, DO   1 month ago Benign hypertensive renal disease  [facility] [place], [place] P, DO   3 months ago Routine general medical examination at a health care facility   [facility] [place], [place] P, DO   5 months ago Acute cystitis without hematuria   [facility] [place], Megan P, DO              Passed - Bone Mineral Density or Dexa Scan completed in the last 2 years

## 2024-02-03 ENCOUNTER — Other Ambulatory Visit: Payer: Self-pay | Admitting: Family Medicine

## 2024-02-06 NOTE — Telephone Encounter (Signed)
 Requested medication (s) are due for refill today: yes  Requested medication (s) are on the active medication list: yes  Last refill:  04/12/24  Future visit scheduled: yes  Notes to clinic:  overdue lab work    Requested Prescriptions  Pending Prescriptions Disp Refills   alendronate  (FOSAMAX ) 70 MG tablet [Pharmacy Med Name: Alendronate  Sodium 70 MG Oral Tablet] 12 tablet 3    Sig: TAKE 1 TABLET BY MOUTH WEEKLY  WITH 8 OZ OF PLAIN WATER 30  MINUTES BEFORE FIRST FOOD, DRINK OR MEDS. STAY UPRIGHT FOR 30  MINS     Endocrinology:  Bisphosphonates Failed - 02/06/2024 10:21 AM      Failed - Vitamin D  in normal range and within 360 days    Vit D, 25-Hydroxy  Date Value Ref Range Status  07/06/2020 40.7 30.0 - 100.0 ng/mL Final    Comment:    Vitamin D  deficiency has been defined by the Institute of Medicine and an Endocrine Society practice guideline as a level of serum 25-OH vitamin D  less than 20 ng/mL (1,2). The Endocrine Society went on to further define vitamin D  insufficiency as a level between 21 and 29 ng/mL (2). 1. IOM (Institute of Medicine). 2010. Dietary reference    intakes for calcium  and D. [place]: The    [employer]. 2. Holick MF, Binkley [place], Bischoff-Ferrari HA, et al.    Evaluation, treatment, and prevention of vitamin D     deficiency: an Endocrine Society clinical practice    guideline. JCEM. 2011 Jul; 96(7):1911-30.          Failed - Mg Level in normal range and within 360 days    Magnesium  Date Value Ref Range Status  03/02/2015 2.0 1.6 - 2.3 mg/dL Final         Failed - Phosphate in normal range and within 360 days    No results found for: PHOS       Passed - Ca in normal range and within 360 days    Calcium   Date Value Ref Range Status  11/02/2023 9.2 8.7 - 10.3 mg/dL Final         Passed - Cr in normal range and within 360 days    Creatinine, Ser  Date Value Ref Range Status  11/02/2023 0.91 0.57 - 1.00 mg/dL Final          Passed - eGFR is 30 or above and within 360 days    GFR calc Af Amer  Date Value Ref Range Status  07/06/2020 66 >59 mL/min/1.73 Final    Comment:    **In accordance with recommendations from the NKF-ASN Task force,**   [facility] is in the process of updating its eGFR calculation to the   2021 CKD-EPI creatinine equation that estimates kidney function   without a race variable.    GFR calc non Af Amer  Date Value Ref Range Status  07/06/2020 57 (L) >59 mL/min/1.73 Final   eGFR  Date Value Ref Range Status  11/02/2023 67 >59 mL/min/1.73 Final         Passed - Valid encounter within last 12 months    Recent Outpatient Visits           2 months ago Benign hypertensive renal disease   [place] [facility] [place], Megan P, DO   3 months ago Benign hypertensive renal disease   [facility] [place], Megan P, DO   5 months ago Routine general medical examination  at a health care facility   [facility] [place], [place] P, DO   6 months ago Acute cystitis without hematuria   [place] [facility] [place], [place] P, DO              Passed - Bone Mineral Density or Dexa Scan completed in the last 2 years

## 2024-02-13 ENCOUNTER — Encounter: Payer: Self-pay | Admitting: Family Medicine

## 2024-02-13 ENCOUNTER — Ambulatory Visit (INDEPENDENT_AMBULATORY_CARE_PROVIDER_SITE_OTHER): Admitting: Family Medicine

## 2024-02-13 VITALS — BP 140/84 | HR 102 | Temp 97.4°F | Ht 61.5 in | Wt 153.0 lb

## 2024-02-13 DIAGNOSIS — M816 Localized osteoporosis [Lequesne]: Secondary | ICD-10-CM | POA: Diagnosis not present

## 2024-02-13 DIAGNOSIS — I129 Hypertensive chronic kidney disease with stage 1 through stage 4 chronic kidney disease, or unspecified chronic kidney disease: Secondary | ICD-10-CM

## 2024-02-13 DIAGNOSIS — J449 Chronic obstructive pulmonary disease, unspecified: Secondary | ICD-10-CM | POA: Diagnosis not present

## 2024-02-13 DIAGNOSIS — N183 Chronic kidney disease, stage 3 unspecified: Secondary | ICD-10-CM | POA: Diagnosis not present

## 2024-02-13 DIAGNOSIS — E782 Mixed hyperlipidemia: Secondary | ICD-10-CM

## 2024-02-13 MED ORDER — CLOBETASOL PROPIONATE 0.05 % EX OINT: 1.0000 | TOPICAL_OINTMENT | Freq: Two times a day (BID) | CUTANEOUS | 3 refills | Status: AC

## 2024-02-13 MED ORDER — ATORVASTATIN CALCIUM 20 MG PO TABS: 20.0000 mg | ORAL_TABLET | Freq: Every day | ORAL | 2 refills | Status: AC

## 2024-02-13 MED ORDER — OMEPRAZOLE 20 MG PO CPDR: 20.0000 mg | DELAYED_RELEASE_CAPSULE | Freq: Every day | ORAL | 1 refills | Status: AC

## 2024-02-13 MED ORDER — LISINOPRIL 30 MG PO TABS: 30.0000 mg | ORAL_TABLET | Freq: Every day | ORAL | 0 refills | Status: DC

## 2024-02-13 NOTE — Progress Notes (Signed)
 BP (!) 140/84   Pulse (!) 102   Temp (!) 97.4 F (36.3 C) (Oral)   Ht 5' 1.5 (1.562 m)   Wt 153 lb (69.4 kg)   SpO2 97%   BMI 28.44 kg/m    Subjective:    Patient ID: Jasmine Church, female    DOB: 08/24/1950, 73 y.o.   MRN: [id number]  HPI: Jasmine Church is a 73 y.o. female  Chief Complaint  Patient presents with   Hypertension   Medication Management    Osteoporosis medication pt states side effects are shattered teeth and she has lost a tooth and would like an alternative    OSTEOPOROSIS Duration: chronic Satisfied with current treatment?: no Medication side effects: yes- dental problems on the bisphosphinate Medication compliance: fair compliance Past osteoporosis medications/treatments: fosamax  Adequate calcium  & vitamin D : yes Intolerance to bisphosphonates:yes Weight bearing exercises: yes  HYPERTENSION / HYPERLIPIDEMIA Satisfied with current treatment? yes Duration of hypertension: chronic BP monitoring frequency: not checking BP medication side effects: no Past BP meds: lisinopril  Duration of hyperlipidemia: chronic Cholesterol medication side effects: no Cholesterol supplements: none Past cholesterol medications: atorvastatin  Medication compliance: excellent compliance Aspirin: no Recent stressors: yes Recurrent headaches: no Visual changes: no Palpitations: no Dyspnea: no Chest pain: no Lower extremity edema: no Dizzy/lightheaded: no   Relevant past medical, surgical, family and social history reviewed and updated as indicated. Interim medical history since our last visit reviewed. Allergies and medications reviewed and updated.  Review of Systems  Constitutional: Negative.   Respiratory: Negative.    Cardiovascular: Negative.   Musculoskeletal: Negative.   Skin: Negative.   Psychiatric/Behavioral: Negative.      Per HPI unless specifically indicated above     Objective:    BP (!) 140/84   Pulse (!) 102   Temp (!) 97.4  F (36.3 C) (Oral)   Ht 5' 1.5 (1.562 m)   Wt 153 lb (69.4 kg)   SpO2 97%   BMI 28.44 kg/m   Wt Readings from Last 3 Encounters:  02/13/24 153 lb (69.4 kg)  11/13/23 152 lb 12.8 oz (69.3 kg)  11/02/23 154 lb 3.2 oz (69.9 kg)    Physical Exam Vitals and nursing note reviewed.  Constitutional:      General: She is not in acute distress.    Appearance: Normal appearance. She is not ill-appearing, toxic-appearing or diaphoretic.  HENT:     Head: Normocephalic and atraumatic.     Right Ear: External ear normal.     Left Ear: External ear normal.     Nose: Nose normal.     Mouth/Throat:     Mouth: Mucous membranes are moist.     Pharynx: Oropharynx is clear.  Eyes:     General: No scleral icterus.       Right eye: No discharge.        Left eye: No discharge.     Extraocular Movements: Extraocular movements intact.     Conjunctiva/sclera: Conjunctivae normal.     Pupils: Pupils are equal, round, and reactive to light.  Cardiovascular:     Rate and Rhythm: Normal rate and regular rhythm.     Pulses: Normal pulses.     Heart sounds: Normal heart sounds. No murmur heard.    No friction rub. No gallop.  Pulmonary:     Effort: Pulmonary effort is normal. No respiratory distress.     Breath sounds: Normal breath sounds. No stridor. No wheezing, rhonchi or rales.  Chest:  Chest wall: No tenderness.  Musculoskeletal:        General: Normal range of motion.     Cervical back: Normal range of motion and neck supple.  Skin:    General: Skin is warm and dry.     Capillary Refill: Capillary refill takes less than 2 seconds.     Coloration: Skin is not jaundiced or pale.     Findings: No bruising, erythema, lesion or rash.  Neurological:     General: No focal deficit present.     Mental Status: She is alert and oriented to person, place, and time. Mental status is at baseline.  Psychiatric:        Mood and Affect: Mood normal.        Behavior: Behavior normal.        Thought  Content: Thought content normal.        Judgment: Judgment normal.     Results for orders placed or performed in visit on 11/02/23  Basic metabolic panel with GFR   Collection Time: 11/02/23  9:09 AM  Result Value Ref Range   Glucose 91 70 - 99 mg/dL   BUN 13 8 - 27 mg/dL   Creatinine, Ser 9.08 0.57 - 1.00 mg/dL   eGFR 67 >40 fO/fpw/8.26   BUN/Creatinine Ratio 14 12 - 28   Sodium 142 134 - 144 mmol/L   Potassium 4.3 3.5 - 5.2 mmol/L   Chloride 104 96 - 106 mmol/L   CO2 23 20 - 29 mmol/L   Calcium  9.2 8.7 - 10.3 mg/dL      Assessment & Plan:   Problem List Items Addressed This Visit       Respiratory   COPD (chronic obstructive pulmonary disease) (HCC)   Under good control on current regimen. Continue current regimen. Continue to monitor. Call with any concerns. Refills given.          Musculoskeletal and Integument   Osteoporosis   Unable to tolerate bisphosphonates. Encouraged her to go to the osteoporosis clinic. Call with any concerns.         Genitourinary   Benign hypertensive renal disease - Primary   Still running high. Will increase her lisinopril  to 30mg  and recheck in 4-6 weeks. Call with any concerns.       Relevant Orders   CBC with Differential/Platelet   Comprehensive metabolic panel with GFR   CKD (chronic kidney disease) stage 3, GFR 30-59 ml/min (HCC)   Rechecking labs today. Await results. Treat as needed.         Other   Hyperlipidemia   Under good control on current regimen. Continue current regimen. Continue to monitor. Call with any concerns. Refills given. Labs drawn today.        Relevant Medications   atorvastatin  (LIPITOR) 20 MG tablet   lisinopril  (ZESTRIL ) 30 MG tablet   Other Relevant Orders   Comprehensive metabolic panel with GFR   Lipid Panel w/o Chol/HDL Ratio     Follow up plan: Return 4-6 weeks.

## 2024-02-13 NOTE — Assessment & Plan Note (Signed)
 Rechecking labs today. Await results. Treat as needed.

## 2024-02-13 NOTE — Assessment & Plan Note (Signed)
 Under good control on current regimen. Continue current regimen. Continue to monitor. Call with any concerns. Refills given. Labs drawn today.

## 2024-02-13 NOTE — Assessment & Plan Note (Signed)
 Under good control on current regimen. Continue current regimen. Continue to monitor. Call with any concerns. Refills given.

## 2024-02-13 NOTE — Assessment & Plan Note (Signed)
 Still running high. Will increase her lisinopril  to 30mg  and recheck in 4-6 weeks. Call with any concerns.

## 2024-02-13 NOTE — Assessment & Plan Note (Signed)
 Unable to tolerate bisphosphonates. Encouraged her to go to the osteoporosis clinic. Call with any concerns.

## 2024-02-14 ENCOUNTER — Ambulatory Visit: Payer: Self-pay | Admitting: Family Medicine

## 2024-02-14 LAB — CBC WITH DIFFERENTIAL/PLATELET
Basophils Absolute: 0 x10E3/uL (ref 0.0–0.2)
Basos: 1 %
EOS (ABSOLUTE): 0.3 x10E3/uL (ref 0.0–0.4)
Eos: 4 %
Hematocrit: 43.6 % (ref 34.0–46.6)
Hemoglobin: 14 g/dL (ref 11.1–15.9)
Immature Grans (Abs): 0 x10E3/uL (ref 0.0–0.1)
Immature Granulocytes: 0 %
Lymphocytes Absolute: 2 x10E3/uL (ref 0.7–3.1)
Lymphs: 35 %
MCH: 28.5 pg (ref 26.6–33.0)
MCHC: 32.1 g/dL (ref 31.5–35.7)
MCV: 89 fL (ref 79–97)
Monocytes Absolute: 0.5 x10E3/uL (ref 0.1–0.9)
Monocytes: 9 %
Neutrophils Absolute: 2.9 x10E3/uL (ref 1.4–7.0)
Neutrophils: 51 %
Platelets: 252 x10E3/uL (ref 150–450)
RBC: 4.91 x10E6/uL (ref 3.77–5.28)
RDW: 13.1 % (ref 11.7–15.4)
WBC: 5.6 x10E3/uL (ref 3.4–10.8)

## 2024-02-14 LAB — COMPREHENSIVE METABOLIC PANEL WITH GFR
ALT: 24 IU/L (ref 0–32)
AST: 22 IU/L (ref 0–40)
Albumin: 4.1 g/dL (ref 3.8–4.8)
Alkaline Phosphatase: 91 IU/L (ref 44–121)
BUN/Creatinine Ratio: 15 (ref 12–28)
BUN: 17 mg/dL (ref 8–27)
Bilirubin Total: 0.4 mg/dL (ref 0.0–1.2)
CO2: 22 mmol/L (ref 20–29)
Calcium: 9.1 mg/dL (ref 8.7–10.3)
Chloride: 103 mmol/L (ref 96–106)
Creatinine, Ser: 1.1 mg/dL — ABNORMAL HIGH (ref 0.57–1.00)
Globulin, Total: 2.4 g/dL (ref 1.5–4.5)
Glucose: 115 mg/dL — ABNORMAL HIGH (ref 70–99)
Potassium: 4.6 mmol/L (ref 3.5–5.2)
Sodium: 142 mmol/L (ref 134–144)
Total Protein: 6.5 g/dL (ref 6.0–8.5)
eGFR: 54 mL/min/1.73 — ABNORMAL LOW (ref >59)

## 2024-02-14 LAB — LIPID PANEL W/O CHOL/HDL RATIO
Cholesterol, Total: 185 mg/dL (ref 100–199)
HDL: 60 mg/dL (ref >39)
LDL Chol Calc (NIH): 111 mg/dL — ABNORMAL HIGH (ref 0–99)
Triglycerides: 75 mg/dL (ref 0–149)
VLDL Cholesterol Cal: 14 mg/dL (ref 5–40)

## 2024-02-23 NOTE — Telephone Encounter (Signed)
 Copied from CRM [id number]. Topic: Clinical - Prescription Issue >> Feb 22, 2024 11:45 AM Jasmine Church wrote: Reason for CRM: Pt stated she has not received her medication lisinopril  (ZESTRIL ) 30 MG tablet from [facility] [address] [place] [address] pH: [phone]. It shows that it was sent to: [facility] - [facility] [address] [place] [address], [place] [address] Phone: [phone]  Fax: [phone] E-Prescribing Status: Receipt confirmed by pharmacy (02/13/2024 11:01 AM EDT). Pt would like a call back at [phone].

## 2024-02-29 MED ORDER — LISINOPRIL 30 MG PO TABS: 30.0000 mg | ORAL_TABLET | Freq: Every day | ORAL | 0 refills | Status: DC

## 2024-03-06 ENCOUNTER — Ambulatory Visit
Admission: RE | Admit: 2024-03-06 | Discharge: 2024-03-06 | Disposition: A | Discharge status: Home / Self Care | Source: Ambulatory Visit | Attending: Family Medicine | Admitting: Family Medicine

## 2024-03-06 DIAGNOSIS — Z1231 Encounter for screening mammogram for malignant neoplasm of breast: Secondary | ICD-10-CM | POA: Insufficient documentation

## 2024-03-08 ENCOUNTER — Ambulatory Visit: Payer: Self-pay | Admitting: Family Medicine

## 2024-03-11 NOTE — Progress Notes (Signed)
 Letter has been printed and mailed.

## 2024-04-03 ENCOUNTER — Ambulatory Visit: Admitting: Family Medicine

## 2024-04-03 ENCOUNTER — Encounter: Payer: Self-pay | Admitting: Family Medicine

## 2024-04-03 VITALS — BP 172/100 | HR 92 | Temp 98.0°F | Ht 61.5 in | Wt 157.6 lb

## 2024-04-03 DIAGNOSIS — I129 Hypertensive chronic kidney disease with stage 1 through stage 4 chronic kidney disease, or unspecified chronic kidney disease: Secondary | ICD-10-CM

## 2024-04-03 DIAGNOSIS — Z23 Encounter for immunization: Secondary | ICD-10-CM

## 2024-04-03 MED ORDER — AMLODIPINE BESYLATE 5 MG PO TABS: 5.0000 mg | ORAL_TABLET | Freq: Every day | ORAL | 0 refills | Status: DC

## 2024-04-03 NOTE — Progress Notes (Signed)
 BP (!) 172/100   Pulse 92   Temp 98 F (36.7 C) (Oral)   Ht 5' 1.5 (1.562 m)   Wt 157 lb 9.6 oz (71.5 kg)   SpO2 97%   BMI 29.30 kg/m    Subjective:    Patient ID: Jasmine Church, female    DOB: 1950/10/08, 73 y.o.   MRN: [id number]  HPI: Jasmine Church is a 73 y.o. female  Chief Complaint  Patient presents with   Hypertension   HYPERTENSION  Hypertension status: uncontrolled  Satisfied with current treatment? no Duration of hypertension: chronic BP monitoring frequency:  not checking BP medication side effects:  no Medication compliance: excellent compliance Previous BP meds:lisinopril  Aspirin: yes Recurrent headaches: yes Visual changes: no Palpitations: no Dyspnea: no Chest pain: no Lower extremity edema: no Dizzy/lightheaded: no   Relevant past medical, surgical, family and social history reviewed and updated as indicated. Interim medical history since our last visit reviewed. Allergies and medications reviewed and updated.  Review of Systems  Constitutional: Negative.   Respiratory: Negative.    Cardiovascular: Negative.   Musculoskeletal: Negative.   Neurological: Negative.   Psychiatric/Behavioral:  Negative for agitation, behavioral problems, confusion, decreased concentration, dysphoric mood, hallucinations, self-injury, sleep disturbance and suicidal ideas. The patient is nervous/anxious. The patient is not hyperactive.     Per HPI unless specifically indicated above     Objective:    BP (!) 172/100   Pulse 92   Temp 98 F (36.7 C) (Oral)   Ht 5' 1.5 (1.562 m)   Wt 157 lb 9.6 oz (71.5 kg)   SpO2 97%   BMI 29.30 kg/m   Wt Readings from Last 3 Encounters:  04/03/24 157 lb 9.6 oz (71.5 kg)  02/13/24 153 lb (69.4 kg)  11/13/23 152 lb 12.8 oz (69.3 kg)    Physical Exam Vitals and nursing note reviewed.  Constitutional:      General: She is not in acute distress.    Appearance: Normal appearance. She is not ill-appearing,  toxic-appearing or diaphoretic.  HENT:     Head: Normocephalic and atraumatic.     Right Ear: External ear normal.     Left Ear: External ear normal.     Nose: Nose normal.     Mouth/Throat:     Mouth: Mucous membranes are moist.     Pharynx: Oropharynx is clear.  Eyes:     General: No scleral icterus.       Right eye: No discharge.        Left eye: No discharge.     Extraocular Movements: Extraocular movements intact.     Conjunctiva/sclera: Conjunctivae normal.     Pupils: Pupils are equal, round, and reactive to light.  Cardiovascular:     Rate and Rhythm: Normal rate and regular rhythm.     Pulses: Normal pulses.     Heart sounds: Normal heart sounds. No murmur heard.    No friction rub. No gallop.  Pulmonary:     Effort: Pulmonary effort is normal. No respiratory distress.     Breath sounds: Normal breath sounds. No stridor. No wheezing, rhonchi or rales.  Chest:     Chest wall: No tenderness.  Musculoskeletal:        General: Normal range of motion.     Cervical back: Normal range of motion and neck supple.  Skin:    General: Skin is warm and dry.     Capillary Refill: Capillary refill takes less than 2 seconds.  Coloration: Skin is not jaundiced or pale.     Findings: No bruising, erythema, lesion or rash.  Neurological:     General: No focal deficit present.     Mental Status: She is alert and oriented to person, place, and time. Mental status is at baseline.  Psychiatric:        Mood and Affect: Mood normal.        Behavior: Behavior normal.        Thought Content: Thought content normal.        Judgment: Judgment normal.     Results for orders placed or performed in visit on 02/13/24  CBC with Differential/Platelet   Collection Time: 02/13/24 11:06 AM  Result Value Ref Range   WBC 5.6 3.4 - 10.8 x10E3/uL   RBC 4.91 3.77 - 5.28 x10E6/uL   Hemoglobin 14.0 11.1 - 15.9 g/dL   Hematocrit 56.3 65.9 - 46.6 %   MCV 89 79 - 97 fL   MCH 28.5 26.6 - 33.0 pg    MCHC 32.1 31.5 - 35.7 g/dL   RDW 86.8 88.2 - 84.5 %   Platelets 252 150 - 450 x10E3/uL   Neutrophils 51 Not Estab. %   Lymphs 35 Not Estab. %   Monocytes 9 Not Estab. %   Eos 4 Not Estab. %   Basos 1 Not Estab. %   Neutrophils Absolute 2.9 1.4 - 7.0 x10E3/uL   Lymphocytes Absolute 2.0 0.7 - 3.1 x10E3/uL   Monocytes Absolute 0.5 0.1 - 0.9 x10E3/uL   EOS (ABSOLUTE) 0.3 0.0 - 0.4 x10E3/uL   Basophils Absolute 0.0 0.0 - 0.2 x10E3/uL   Immature Granulocytes 0 Not Estab. %   Immature Grans (Abs) 0.0 0.0 - 0.1 x10E3/uL  Comprehensive metabolic panel with GFR   Collection Time: 02/13/24 11:06 AM  Result Value Ref Range   Glucose 115 (H) 70 - 99 mg/dL   BUN 17 8 - 27 mg/dL   Creatinine, Ser 8.89 (H) 0.57 - 1.00 mg/dL   eGFR 54 (L) >40 fO/fpw/8.26   BUN/Creatinine Ratio 15 12 - 28   Sodium 142 134 - 144 mmol/L   Potassium 4.6 3.5 - 5.2 mmol/L   Chloride 103 96 - 106 mmol/L   CO2 22 20 - 29 mmol/L   Calcium  9.1 8.7 - 10.3 mg/dL   Total Protein 6.5 6.0 - 8.5 g/dL   Albumin 4.1 3.8 - 4.8 g/dL   Globulin, Total 2.4 1.5 - 4.5 g/dL   Bilirubin Total 0.4 0.0 - 1.2 mg/dL   Alkaline Phosphatase 91 44 - 121 IU/L   AST 22 0 - 40 IU/L   ALT 24 0 - 32 IU/L  Lipid Panel w/o Chol/HDL Ratio   Collection Time: 02/13/24 11:06 AM  Result Value Ref Range   Cholesterol, Total 185 100 - 199 mg/dL   Triglycerides 75 0 - 149 mg/dL   HDL 60 >60 mg/dL   VLDL Cholesterol Cal 14 5 - 40 mg/dL   LDL Chol Calc (NIH) 888 (H) 0 - 99 mg/dL      Assessment & Plan:   Problem List Items Addressed This Visit       Genitourinary   Benign hypertensive renal disease   Not under good control. Will add 5mg  amlodipine  and recheck in about a month. Call with any concerns.       Other Visit Diagnoses       Needs flu shot    -  Primary   Relevant Orders   Flu  vaccine HIGH DOSE PF(Fluzone Trivalent) (Completed)        Follow up plan: Return in about 4 weeks (around 05/01/2024).

## 2024-04-03 NOTE — Assessment & Plan Note (Signed)
 Not under good control. Will add 5mg  amlodipine  and recheck in about a month. Call with any concerns.

## 2024-05-13 ENCOUNTER — Encounter: Payer: Self-pay | Admitting: Family Medicine

## 2024-05-13 ENCOUNTER — Ambulatory Visit (INDEPENDENT_AMBULATORY_CARE_PROVIDER_SITE_OTHER): Admitting: Family Medicine

## 2024-05-13 VITALS — BP 144/84 | HR 93 | Temp 97.7°F | Ht 62.5 in | Wt 157.8 lb

## 2024-05-13 DIAGNOSIS — I129 Hypertensive chronic kidney disease with stage 1 through stage 4 chronic kidney disease, or unspecified chronic kidney disease: Secondary | ICD-10-CM | POA: Diagnosis not present

## 2024-05-13 MED ORDER — LISINOPRIL 40 MG PO TABS: 40.0000 mg | ORAL_TABLET | Freq: Every day | ORAL | 0 refills | Status: DC

## 2024-05-13 NOTE — Assessment & Plan Note (Signed)
 Better, but still running high- will increase her lisinopril  to 40mg  and recheck in 1 month. Call with any concerns.

## 2024-05-13 NOTE — Progress Notes (Signed)
 BP (!) 144/84   Pulse 93   Temp 97.7 F (36.5 C) (Oral)   Ht 5' 2.5 (1.588 m)   Wt 157 lb 12.8 oz (71.6 kg)   SpO2 94%   BMI 28.40 kg/m    Subjective:    Patient ID: Jasmine Church, female    DOB: [birth year], 73 y.o.   MRN: [id number]  HPI: Jasmine Church is a 73 y.o. female  Chief Complaint  Patient presents with   Hypertension   HYPERTENSION  Hypertension status: better  Satisfied with current treatment? yes Duration of hypertension: chronic BP monitoring frequency:  rarely BP medication side effects:  no Medication compliance: excellent compliance Previous BP meds:amlodipine , lisinopril , lasix  Aspirin: yes Recurrent headaches: no Visual changes: no Palpitations: no Dyspnea: no Chest pain: no Lower extremity edema: no Dizzy/lightheaded: no   Relevant past medical, surgical, family and social history reviewed and updated as indicated. Interim medical history since our last visit reviewed. Allergies and medications reviewed and updated.  Review of Systems  Constitutional: Negative.   Respiratory: Negative.    Cardiovascular: Negative.   Musculoskeletal: Negative.   Neurological: Negative.   Psychiatric/Behavioral: Negative.      Per HPI unless specifically indicated above     Objective:    BP (!) 144/84   Pulse 93   Temp 97.7 F (36.5 C) (Oral)   Ht 5' 2.5 (1.588 m)   Wt 157 lb 12.8 oz (71.6 kg)   SpO2 94%   BMI 28.40 kg/m   Wt Readings from Last 3 Encounters:  05/13/24 157 lb 12.8 oz (71.6 kg)  04/03/24 157 lb 9.6 oz (71.5 kg)  02/13/24 153 lb (69.4 kg)    Physical Exam Vitals and nursing note reviewed.  Constitutional:      General: She is not in acute distress.    Appearance: Normal appearance. She is not ill-appearing, toxic-appearing or diaphoretic.  HENT:     Head: Normocephalic and atraumatic.     Right Ear: External ear normal.     Left Ear: External ear normal.     Nose: Nose normal.     Mouth/Throat:     Mouth:  Mucous membranes are moist.     Pharynx: Oropharynx is clear.  Eyes:     General: No scleral icterus.       Right eye: No discharge.        Left eye: No discharge.     Extraocular Movements: Extraocular movements intact.     Conjunctiva/sclera: Conjunctivae normal.     Pupils: Pupils are equal, round, and reactive to light.  Cardiovascular:     Rate and Rhythm: Normal rate and regular rhythm.     Pulses: Normal pulses.     Heart sounds: Normal heart sounds. No murmur heard.    No friction rub. No gallop.  Pulmonary:     Effort: Pulmonary effort is normal. No respiratory distress.     Breath sounds: Normal breath sounds. No stridor. No wheezing, rhonchi or rales.  Chest:     Chest wall: No tenderness.  Musculoskeletal:        General: Normal range of motion.     Cervical back: Normal range of motion and neck supple.  Skin:    General: Skin is warm and dry.     Capillary Refill: Capillary refill takes less than 2 seconds.     Coloration: Skin is not jaundiced or pale.     Findings: No bruising, erythema, lesion or rash.  Neurological:  General: No focal deficit present.     Mental Status: She is alert and oriented to person, place, and time. Mental status is at baseline.  Psychiatric:        Mood and Affect: Mood normal.        Behavior: Behavior normal.        Thought Content: Thought content normal.        Judgment: Judgment normal.     Results for orders placed or performed in visit on 02/13/24  CBC with Differential/Platelet   Collection Time: 02/13/24 11:06 AM  Result Value Ref Range   WBC 5.6 3.4 - 10.8 x10E3/uL   RBC 4.91 3.77 - 5.28 x10E6/uL   Hemoglobin 14.0 11.1 - 15.9 g/dL   Hematocrit 56.3 65.9 - 46.6 %   MCV 89 79 - 97 fL   MCH 28.5 26.6 - 33.0 pg   MCHC 32.1 31.5 - 35.7 g/dL   RDW 86.8 88.2 - 84.5 %   Platelets 252 150 - 450 x10E3/uL   Neutrophils 51 Not Estab. %   Lymphs 35 Not Estab. %   Monocytes 9 Not Estab. %   Eos 4 Not Estab. %   Basos 1  Not Estab. %   Neutrophils Absolute 2.9 1.4 - 7.0 x10E3/uL   Lymphocytes Absolute 2.0 0.7 - 3.1 x10E3/uL   Monocytes Absolute 0.5 0.1 - 0.9 x10E3/uL   EOS (ABSOLUTE) 0.3 0.0 - 0.4 x10E3/uL   Basophils Absolute 0.0 0.0 - 0.2 x10E3/uL   Immature Granulocytes 0 Not Estab. %   Immature Grans (Abs) 0.0 0.0 - 0.1 x10E3/uL  Comprehensive metabolic panel with GFR   Collection Time: 02/13/24 11:06 AM  Result Value Ref Range   Glucose 115 (H) 70 - 99 mg/dL   BUN 17 8 - 27 mg/dL   Creatinine, Ser 8.89 (H) 0.57 - 1.00 mg/dL   eGFR 54 (L) >40 fO/fpw/8.26   BUN/Creatinine Ratio 15 12 - 28   Sodium 142 134 - 144 mmol/L   Potassium 4.6 3.5 - 5.2 mmol/L   Chloride 103 96 - 106 mmol/L   CO2 22 20 - 29 mmol/L   Calcium  9.1 8.7 - 10.3 mg/dL   Total Protein 6.5 6.0 - 8.5 g/dL   Albumin 4.1 3.8 - 4.8 g/dL   Globulin, Total 2.4 1.5 - 4.5 g/dL   Bilirubin Total 0.4 0.0 - 1.2 mg/dL   Alkaline Phosphatase 91 44 - 121 IU/L   AST 22 0 - 40 IU/L   ALT 24 0 - 32 IU/L  Lipid Panel w/o Chol/HDL Ratio   Collection Time: 02/13/24 11:06 AM  Result Value Ref Range   Cholesterol, Total 185 100 - 199 mg/dL   Triglycerides 75 0 - 149 mg/dL   HDL 60 >60 mg/dL   VLDL Cholesterol Cal 14 5 - 40 mg/dL   LDL Chol Calc (NIH) 888 (H) 0 - 99 mg/dL      Assessment & Plan:   Problem List Items Addressed This Visit       Genitourinary   Benign hypertensive renal disease - Primary   Better, but still running high- will increase her lisinopril  to 40mg  and recheck in 1 month. Call with any concerns.         Follow up plan: Return 4-6 weeks follow up BP.

## 2024-06-11 ENCOUNTER — Other Ambulatory Visit: Payer: Self-pay | Admitting: Family Medicine

## 2024-06-14 ENCOUNTER — Encounter: Payer: Self-pay | Admitting: Family Medicine

## 2024-06-14 ENCOUNTER — Ambulatory Visit (INDEPENDENT_AMBULATORY_CARE_PROVIDER_SITE_OTHER): Admitting: Family Medicine

## 2024-06-14 VITALS — BP 122/76 | HR 93 | Temp 97.8°F | Ht 62.1 in | Wt 156.4 lb

## 2024-06-14 DIAGNOSIS — I129 Hypertensive chronic kidney disease with stage 1 through stage 4 chronic kidney disease, or unspecified chronic kidney disease: Secondary | ICD-10-CM

## 2024-06-14 MED ORDER — LISINOPRIL 40 MG PO TABS: 40.0000 mg | ORAL_TABLET | Freq: Every day | ORAL | 0 refills | Status: AC

## 2024-06-14 MED ORDER — AMLODIPINE BESYLATE 5 MG PO TABS: 5.0000 mg | ORAL_TABLET | Freq: Every day | ORAL | 0 refills | Status: AC

## 2024-06-14 NOTE — Progress Notes (Signed)
 "  BP 122/76   Pulse 93   Temp 97.8 F (36.6 C) (Oral)   Ht 5' 2.1 (1.577 m)   Wt 156 lb 6.4 oz (70.9 kg)   SpO2 94%   BMI 28.51 kg/m    Subjective:    Patient ID: Jasmine Church, female    DOB: 1950/08/23, 74 y.o.   MRN: [id number]  HPI: Jasmine Church is a 74 y.o. female  Chief Complaint  Patient presents with   Hypertension   HYPERTENSION  Hypertension status: controlled  Satisfied with current treatment? yes Duration of hypertension: chronic BP monitoring frequency:  not checking BP medication side effects:  no Medication compliance: excellent compliance Previous BP meds:amlodipine , lisinopril , lasix  Aspirin: yes Recurrent headaches: no Visual changes: no Palpitations: no Dyspnea: no Chest pain: no Lower extremity edema: no Dizzy/lightheaded: no   Relevant past medical, surgical, family and social history reviewed and updated as indicated. Interim medical history since our last visit reviewed. Allergies and medications reviewed and updated.  Review of Systems  Constitutional: Negative.   Respiratory: Negative.    Cardiovascular: Negative.   Hematological:  Bruises/bleeds easily.  Psychiatric/Behavioral: Negative.      Per HPI unless specifically indicated above     Objective:    BP 122/76   Pulse 93   Temp 97.8 F (36.6 C) (Oral)   Ht 5' 2.1 (1.577 m)   Wt 156 lb 6.4 oz (70.9 kg)   SpO2 94%   BMI 28.51 kg/m   Wt Readings from Last 3 Encounters:  06/14/24 156 lb 6.4 oz (70.9 kg)  05/13/24 157 lb 12.8 oz (71.6 kg)  04/03/24 157 lb 9.6 oz (71.5 kg)    Physical Exam Vitals and nursing note reviewed.  Constitutional:      General: She is not in acute distress.    Appearance: Normal appearance. She is not ill-appearing, toxic-appearing or diaphoretic.  HENT:     Head: Normocephalic and atraumatic.     Right Ear: External ear normal.     Left Ear: External ear normal.     Nose: Nose normal.     Mouth/Throat:     Mouth: Mucous  membranes are moist.     Pharynx: Oropharynx is clear.  Eyes:     General: No scleral icterus.       Right eye: No discharge.        Left eye: No discharge.     Extraocular Movements: Extraocular movements intact.     Conjunctiva/sclera: Conjunctivae normal.     Pupils: Pupils are equal, round, and reactive to light.  Cardiovascular:     Rate and Rhythm: Normal rate and regular rhythm.     Pulses: Normal pulses.     Heart sounds: Normal heart sounds. No murmur heard.    No friction rub. No gallop.  Pulmonary:     Effort: Pulmonary effort is normal. No respiratory distress.     Breath sounds: Normal breath sounds. No stridor. No wheezing, rhonchi or rales.  Chest:     Chest wall: No tenderness.  Musculoskeletal:        General: Normal range of motion.     Cervical back: Normal range of motion and neck supple.  Skin:    General: Skin is warm and dry.     Capillary Refill: Capillary refill takes less than 2 seconds.     Coloration: Skin is not jaundiced or pale.     Findings: No bruising, erythema, lesion or rash.  Neurological:  General: No focal deficit present.     Mental Status: She is alert and oriented to person, place, and time. Mental status is at baseline.  Psychiatric:        Mood and Affect: Mood normal.        Behavior: Behavior normal.        Thought Content: Thought content normal.        Judgment: Judgment normal.     Results for orders placed or performed in visit on 02/13/24  CBC with Differential/Platelet   Collection Time: 02/13/24 11:06 AM  Result Value Ref Range   WBC 5.6 3.4 - 10.8 x10E3/uL   RBC 4.91 3.77 - 5.28 x10E6/uL   Hemoglobin 14.0 11.1 - 15.9 g/dL   Hematocrit 56.3 65.9 - 46.6 %   MCV 89 79 - 97 fL   MCH 28.5 26.6 - 33.0 pg   MCHC 32.1 31.5 - 35.7 g/dL   RDW 86.8 88.2 - 84.5 %   Platelets 252 150 - 450 x10E3/uL   Neutrophils 51 Not Estab. %   Lymphs 35 Not Estab. %   Monocytes 9 Not Estab. %   Eos 4 Not Estab. %   Basos 1 Not  Estab. %   Neutrophils Absolute 2.9 1.4 - 7.0 x10E3/uL   Lymphocytes Absolute 2.0 0.7 - 3.1 x10E3/uL   Monocytes Absolute 0.5 0.1 - 0.9 x10E3/uL   EOS (ABSOLUTE) 0.3 0.0 - 0.4 x10E3/uL   Basophils Absolute 0.0 0.0 - 0.2 x10E3/uL   Immature Granulocytes 0 Not Estab. %   Immature Grans (Abs) 0.0 0.0 - 0.1 x10E3/uL  Comprehensive metabolic panel with GFR   Collection Time: 02/13/24 11:06 AM  Result Value Ref Range   Glucose 115 (H) 70 - 99 mg/dL   BUN 17 8 - 27 mg/dL   Creatinine, Ser 8.89 (H) 0.57 - 1.00 mg/dL   eGFR 54 (L) >40 fO/fpw/8.26   BUN/Creatinine Ratio 15 12 - 28   Sodium 142 134 - 144 mmol/L   Potassium 4.6 3.5 - 5.2 mmol/L   Chloride 103 96 - 106 mmol/L   CO2 22 20 - 29 mmol/L   Calcium  9.1 8.7 - 10.3 mg/dL   Total Protein 6.5 6.0 - 8.5 g/dL   Albumin 4.1 3.8 - 4.8 g/dL   Globulin, Total 2.4 1.5 - 4.5 g/dL   Bilirubin Total 0.4 0.0 - 1.2 mg/dL   Alkaline Phosphatase 91 44 - 121 IU/L   AST 22 0 - 40 IU/L   ALT 24 0 - 32 IU/L  Lipid Panel w/o Chol/HDL Ratio   Collection Time: 02/13/24 11:06 AM  Result Value Ref Range   Cholesterol, Total 185 100 - 199 mg/dL   Triglycerides 75 0 - 149 mg/dL   HDL 60 >60 mg/dL   VLDL Cholesterol Cal 14 5 - 40 mg/dL   LDL Chol Calc (NIH) 888 (H) 0 - 99 mg/dL      Assessment & Plan:   Problem List Items Addressed This Visit       Genitourinary   Benign hypertensive renal disease - Primary   Under good control on current regimen. Continue current regimen. Continue to monitor. Call with any concerns. Refills given. Labs drawn today.        Relevant Orders   Basic metabolic panel with GFR     Follow up plan: Return in about 3 months (around 09/12/2024) for physical.      "

## 2024-06-14 NOTE — Assessment & Plan Note (Signed)
 Under good control on current regimen. Continue current regimen. Continue to monitor. Call with any concerns. Refills given. Labs drawn today.

## 2024-06-15 LAB — BASIC METABOLIC PANEL WITH GFR
BUN/Creatinine Ratio: 13 (ref 12–28)
BUN: 15 mg/dL (ref 8–27)
CO2: 23 mmol/L (ref 20–29)
Calcium: 9.4 mg/dL (ref 8.7–10.3)
Chloride: 104 mmol/L (ref 96–106)
Creatinine, Ser: 1.14 mg/dL — ABNORMAL HIGH (ref 0.57–1.00)
Glucose: 91 mg/dL (ref 70–99)
Potassium: 4.6 mmol/L (ref 3.5–5.2)
Sodium: 142 mmol/L (ref 134–144)
eGFR: 51 mL/min/1.73 — ABNORMAL LOW

## 2024-06-17 ENCOUNTER — Ambulatory Visit: Payer: Self-pay | Admitting: Family Medicine

## 2024-06-17 NOTE — Progress Notes (Signed)
 Letter printed and mailed.

## 2024-09-02 ENCOUNTER — Encounter: Admitting: Family Medicine
